# Patient Record
Sex: Female | Born: 1990 | Race: White | Hispanic: No | Marital: Married | Smoking: Never smoker
Health system: Southern US, Community
[De-identification: ages and names within clinical notes are randomized; demographics above are authoritative.]

## PROBLEM LIST (undated history)

## (undated) ENCOUNTER — Inpatient Hospital Stay (HOSPITAL_COMMUNITY): Payer: Self-pay

## (undated) DIAGNOSIS — F32A Depression, unspecified: Secondary | ICD-10-CM

## (undated) DIAGNOSIS — C801 Malignant (primary) neoplasm, unspecified: Secondary | ICD-10-CM

## (undated) DIAGNOSIS — R55 Syncope and collapse: Secondary | ICD-10-CM

## (undated) DIAGNOSIS — R197 Diarrhea, unspecified: Secondary | ICD-10-CM

## (undated) DIAGNOSIS — K219 Gastro-esophageal reflux disease without esophagitis: Secondary | ICD-10-CM

## (undated) DIAGNOSIS — R112 Nausea with vomiting, unspecified: Secondary | ICD-10-CM

## (undated) DIAGNOSIS — K5080 Crohn's disease of both small and large intestine without complications: Secondary | ICD-10-CM

## (undated) DIAGNOSIS — K50013 Crohn's disease of small intestine with fistula: Secondary | ICD-10-CM

## (undated) DIAGNOSIS — D649 Anemia, unspecified: Secondary | ICD-10-CM

## (undated) DIAGNOSIS — F329 Major depressive disorder, single episode, unspecified: Secondary | ICD-10-CM

## (undated) DIAGNOSIS — K2210 Ulcer of esophagus without bleeding: Secondary | ICD-10-CM

## (undated) HISTORY — DX: Syncope and collapse: R55

## (undated) HISTORY — DX: Ulcer of esophagus without bleeding: K22.10

## (undated) HISTORY — PX: TONSILLECTOMY AND ADENOIDECTOMY: SUR1326

## (undated) HISTORY — DX: Anemia, unspecified: D64.9

## (undated) HISTORY — DX: Major depressive disorder, single episode, unspecified: F32.9

## (undated) HISTORY — DX: Depression, unspecified: F32.A

## (undated) HISTORY — PX: UPPER GASTROINTESTINAL ENDOSCOPY: SHX188

## (undated) HISTORY — PX: COLONOSCOPY: SHX174

## (undated) HISTORY — PX: SKIN CANCER EXCISION: SHX779

## (undated) HISTORY — DX: Gastro-esophageal reflux disease without esophagitis: K21.9

## (undated) HISTORY — DX: Crohn's disease of both small and large intestine without complications: K50.80

---
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
## 2009-06-26 ENCOUNTER — Ambulatory Visit: Payer: Self-pay | Admitting: Internal Medicine

## 2009-07-17 ENCOUNTER — Ambulatory Visit: Payer: Self-pay | Admitting: Internal Medicine

## 2009-09-13 ENCOUNTER — Ambulatory Visit: Payer: Self-pay | Admitting: Internal Medicine

## 2010-01-15 ENCOUNTER — Ambulatory Visit: Payer: Self-pay | Admitting: Internal Medicine

## 2010-02-18 ENCOUNTER — Encounter: Admission: RE | Admit: 2010-02-18 | Discharge: 2010-02-18 | Payer: Self-pay | Admitting: Internal Medicine

## 2010-02-18 ENCOUNTER — Ambulatory Visit: Payer: Self-pay | Admitting: Internal Medicine

## 2010-02-19 ENCOUNTER — Ambulatory Visit: Payer: Self-pay | Admitting: Internal Medicine

## 2010-02-26 ENCOUNTER — Encounter: Admission: RE | Admit: 2010-02-26 | Discharge: 2010-02-26 | Payer: Self-pay | Admitting: Internal Medicine

## 2010-02-26 ENCOUNTER — Ambulatory Visit: Payer: Self-pay | Admitting: Internal Medicine

## 2010-08-16 ENCOUNTER — Emergency Department (HOSPITAL_COMMUNITY): Payer: No Typology Code available for payment source

## 2010-08-16 ENCOUNTER — Emergency Department (HOSPITAL_COMMUNITY)
Admission: EM | Admit: 2010-08-16 | Discharge: 2010-08-16 | Disposition: A | Discharge status: Home / Self Care | Payer: No Typology Code available for payment source | Attending: Emergency Medicine | Admitting: Emergency Medicine

## 2010-08-16 DIAGNOSIS — S9030XA Contusion of unspecified foot, initial encounter: Secondary | ICD-10-CM | POA: Insufficient documentation

## 2010-08-16 DIAGNOSIS — M25579 Pain in unspecified ankle and joints of unspecified foot: Secondary | ICD-10-CM | POA: Insufficient documentation

## 2010-08-16 DIAGNOSIS — M542 Cervicalgia: Secondary | ICD-10-CM | POA: Insufficient documentation

## 2010-08-16 DIAGNOSIS — M79609 Pain in unspecified limb: Secondary | ICD-10-CM | POA: Insufficient documentation

## 2010-08-16 DIAGNOSIS — R079 Chest pain, unspecified: Secondary | ICD-10-CM | POA: Insufficient documentation

## 2010-08-16 DIAGNOSIS — S139XXA Sprain of joints and ligaments of unspecified parts of neck, initial encounter: Secondary | ICD-10-CM | POA: Insufficient documentation

## 2011-04-15 ENCOUNTER — Encounter: Payer: Self-pay | Admitting: Internal Medicine

## 2011-04-15 ENCOUNTER — Ambulatory Visit (INDEPENDENT_AMBULATORY_CARE_PROVIDER_SITE_OTHER): Payer: PRIVATE HEALTH INSURANCE | Admitting: Internal Medicine

## 2011-04-15 DIAGNOSIS — M542 Cervicalgia: Secondary | ICD-10-CM

## 2011-04-15 DIAGNOSIS — M25511 Pain in right shoulder: Secondary | ICD-10-CM

## 2011-04-15 DIAGNOSIS — Z862 Personal history of diseases of the blood and blood-forming organs and certain disorders involving the immune mechanism: Secondary | ICD-10-CM

## 2011-04-15 DIAGNOSIS — M25519 Pain in unspecified shoulder: Secondary | ICD-10-CM

## 2011-04-15 NOTE — Progress Notes (Signed)
  Subjective:    Patient ID: Robin Miller, female    DOB: 1990-08-13, 20 y.o.   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 Says she has some vague numbness in her right upper arm and right elbow. Had onset of this pain at the end of exams. Of course was spending a lot of time studying during finals. Denies any heavy lifting or injury to right neck or shoulder. Has noticed some weakness in right arm strength. Never had problems like this before.    Review of Systems     Objective:   Physical Exam deep tendon reflexes in both arms are 2+ and symmetrical. Muscle strength is 5 over 5 in the left upper extremity 4/5 in the right upper extremity. Decreased grip right hand. Complaining of some numbness around right elbow but sensation is intact. Has palpable tenderness in the right trapezius muscle area extending into the right paracervical muscle area.        Assessment & Plan:  Suspect this is musculoskeletal pain related to muscle spasm with prolonged studying for final exams. Cannot rule out cervical radiculopathy. Plan: Sterapred DS 10 mg 12 day dosepak. Vicodin 5/500 (#40) 1 by mouth every 6 hours when necessary pain. Recommend ice to right neck area 20 minutes twice daily. Recheck on December 26

## 2011-04-15 NOTE — Patient Instructions (Signed)
Take prednisone for 12 days as directed. Take Vicodin as needed sparingly for pain. Ice her neck down for 20 minutes twice daily. No heavy lifting or heavy housework. Recheck in one week.

## 2011-04-23 ENCOUNTER — Encounter: Payer: Self-pay | Admitting: Internal Medicine

## 2011-04-23 ENCOUNTER — Ambulatory Visit (INDEPENDENT_AMBULATORY_CARE_PROVIDER_SITE_OTHER): Payer: PRIVATE HEALTH INSURANCE | Admitting: Internal Medicine

## 2011-04-23 VITALS — BP 116/78 | HR 76 | Temp 99.3°F | Wt 143.0 lb

## 2011-04-23 DIAGNOSIS — M25519 Pain in unspecified shoulder: Secondary | ICD-10-CM

## 2011-04-23 DIAGNOSIS — Z23 Encounter for immunization: Secondary | ICD-10-CM

## 2011-04-23 DIAGNOSIS — M25511 Pain in right shoulder: Secondary | ICD-10-CM | POA: Insufficient documentation

## 2011-04-23 NOTE — Progress Notes (Signed)
  Subjective:    Patient ID: Robin Miller, female    DOB: 1990-10-25, 20 y.o.   MRN: [id number]  HPI patient here to followup on right arm pain and numbness. She's been on a 12 day Sterapred DS 10 mg dosepak. Decreased range of motion of right arm up over her.. Says that she has a little bit better. Still has intermittent numbness right deltoid region. Says she's not really having neck pain. Has been taking Vicodin at bedtime to sleep. Patient describes pressure feeling sometimes in right upper arm as if the circulation is being "cut off ".    Review of Systems     Objective:   Physical Exam  I believe her muscle strength in her right grip has improved. She thinks it's improved also. I think she is a bit stronger in her right upper extremity in general. Deep tendon reflexes 2+ and symmetrical in upper extremities. Sensation in right upper arm is intact. No numbness at the present time.        Assessment & Plan:  Musculoskeletal pain versus radiculopathy  Symptoms seemed improved with steroids and pain medication.  Plan: Patient returns to [facility] January 14. We'll have her seen by hand surgeon this week for further evaluation before we consider ordering any imaging studies. Continue with prednisone.

## 2011-04-23 NOTE — Patient Instructions (Addendum)
Continue prednisone as directed and Vicodin at bedtime for pain. We will make appointment for you to see hand surgeon. Appointment with Dr. Mina Marble on Thurs 04/24/11 @ 2:10 pm. Notes and demographics faxed.

## 2011-09-17 ENCOUNTER — Emergency Department (HOSPITAL_BASED_OUTPATIENT_CLINIC_OR_DEPARTMENT_OTHER): Payer: PRIVATE HEALTH INSURANCE

## 2011-09-17 ENCOUNTER — Encounter (HOSPITAL_BASED_OUTPATIENT_CLINIC_OR_DEPARTMENT_OTHER): Payer: Self-pay | Admitting: *Deleted

## 2011-09-17 ENCOUNTER — Emergency Department (HOSPITAL_BASED_OUTPATIENT_CLINIC_OR_DEPARTMENT_OTHER)
Admission: EM | Admit: 2011-09-17 | Discharge: 2011-09-18 | Disposition: A | Discharge status: Home / Self Care | Payer: PRIVATE HEALTH INSURANCE | Attending: Emergency Medicine | Admitting: Emergency Medicine

## 2011-09-17 DIAGNOSIS — R509 Fever, unspecified: Secondary | ICD-10-CM | POA: Insufficient documentation

## 2011-09-17 DIAGNOSIS — R110 Nausea: Secondary | ICD-10-CM | POA: Insufficient documentation

## 2011-09-17 DIAGNOSIS — K529 Noninfective gastroenteritis and colitis, unspecified: Secondary | ICD-10-CM

## 2011-09-17 DIAGNOSIS — F329 Major depressive disorder, single episode, unspecified: Secondary | ICD-10-CM | POA: Insufficient documentation

## 2011-09-17 DIAGNOSIS — R630 Anorexia: Secondary | ICD-10-CM | POA: Insufficient documentation

## 2011-09-17 DIAGNOSIS — K5289 Other specified noninfective gastroenteritis and colitis: Secondary | ICD-10-CM | POA: Insufficient documentation

## 2011-09-17 DIAGNOSIS — F3289 Other specified depressive episodes: Secondary | ICD-10-CM | POA: Insufficient documentation

## 2011-09-17 DIAGNOSIS — R1031 Right lower quadrant pain: Secondary | ICD-10-CM | POA: Insufficient documentation

## 2011-09-17 LAB — COMPREHENSIVE METABOLIC PANEL
ALT: 9 U/L (ref 0–35)
AST: 12 U/L (ref 0–37)
Albumin: 3.7 g/dL (ref 3.5–5.2)
CO2: 23 mEq/L (ref 19–32)
Calcium: 9.5 mg/dL (ref 8.4–10.5)
Chloride: 103 mEq/L (ref 96–112)
GFR calc non Af Amer: >90 mL/min (ref >90)
Sodium: 138 mEq/L (ref 135–145)
Total Bilirubin: 0.2 mg/dL — ABNORMAL LOW (ref 0.3–1.2)

## 2011-09-17 LAB — WET PREP, GENITAL
Trich, Wet Prep: NONE SEEN
Yeast Wet Prep HPF POC: NONE SEEN

## 2011-09-17 MED ORDER — IOHEXOL 300 MG/ML  SOLN
100.0000 mL | Freq: Once | INTRAMUSCULAR | Status: AC | PRN
  Administered 2011-09-17: 100 mL via INTRAVENOUS

## 2011-09-17 MED ORDER — ONDANSETRON HCL 4 MG/2ML IJ SOLN
4.0000 mg | Freq: Once | INTRAMUSCULAR | Status: AC
  Administered 2011-09-17: 4 mg via INTRAVENOUS
  Filled 2011-09-17: qty 2

## 2011-09-17 MED ORDER — SODIUM CHLORIDE 0.9 % IV BOLUS (SEPSIS)
1000.0000 mL | Freq: Once | INTRAVENOUS | Status: AC
  Administered 2011-09-17: 1000 mL via INTRAVENOUS

## 2011-09-17 MED ORDER — MORPHINE SULFATE 4 MG/ML IJ SOLN
4.0000 mg | Freq: Once | INTRAMUSCULAR | Status: AC
  Administered 2011-09-17: 4 mg via INTRAVENOUS
  Filled 2011-09-17: qty 1

## 2011-09-17 MED ORDER — HYDROMORPHONE HCL PF 1 MG/ML IJ SOLN
1.0000 mg | Freq: Once | INTRAMUSCULAR | Status: AC
  Administered 2011-09-17: 1 mg via INTRAVENOUS
  Filled 2011-09-17: qty 1

## 2011-09-17 MED ORDER — IOHEXOL 300 MG/ML  SOLN: 20.0000 mL | INTRAMUSCULAR | Status: AC

## 2011-09-17 NOTE — ED Notes (Signed)
Sent here from urgent care for right lower  abd pain x 2 weeks. CBC and u/e and urine preg neg

## 2011-09-17 NOTE — ED Provider Notes (Signed)
History     CSN: [id number]  Arrival date & time 09/17/11  2105   First MD Initiated Contact with Patient 09/17/11 2146      Chief Complaint  Patient presents with  . Abdominal Pain    (Consider location/radiation/quality/duration/timing/severity/associated sxs/prior treatment) HPI Pt presenting with RLQ abdominal pain.  Pt states pain began approx 10 days ago, was initially periumbilical in nature, then migrated to RLQ.  Pt has had nausea, no vomiting.  Low grade fever x 2 days approx 99-100.  Pain has worsened today.  Decreased appetite.  Denies dysuria or vaginal bleeding.  Pt was seen at [facility]- WBC normal, urinalysis and urine preg reassuring.  Referred to ED for further evaluation.  Movement and palpation make pain worse, lying still makes it better, there are no other associated systemic symptoms.   Past Medical History  Diagnosis Date  . Depression   . Vasovagal syncope     History reviewed. No pertinent past surgical history.  Family History  Problem Relation Age of Onset  . Stroke Mother     History  Substance Use Topics  . Smoking status: Never Smoker   . Smokeless tobacco: Never Used  . Alcohol Use: No    OB History    Grav Para Term Preterm Abortions TAB SAB Ect Mult Living                  Review of Systems ROS reviewed and all otherwise negative except for mentioned in HPI  Allergies  Review of patient's allergies indicates no known allergies.  Home Medications   Current Outpatient Rx  Name Route Sig Dispense Refill  . CIPROFLOXACIN HCL 500 MG PO TABS Oral Take 1 tablet (500 mg total) by mouth 2 (two) times daily. One po bid x 7 days 14 tablet 0  . ETONOGESTREL-ETHINYL ESTRADIOL 0.12-0.015 MG/24HR VA RING Vaginal Place 1 each vaginally every 28 (twenty-eight) days. Insert vaginally and leave in place for 3 consecutive weeks, then remove for 1 week.     Marland Kitchen METRONIDAZOLE 500 MG PO TABS Oral Take 1 tablet (500 mg total) by mouth 3 (three) times  daily. 21 tablet 0  . OXYCODONE-ACETAMINOPHEN 5-325 MG PO TABS Oral Take 2 tablets by mouth every 4 (four) hours as needed for pain. 13 tablet 0    BP 120/67  Pulse 85  Temp(Src) 98.6 F (37 C) (Oral)  Resp 12  Ht 5\' 3"  (1.6 m)  Wt 140 lb (63.504 kg)  BMI 24.80 kg/m2  SpO2 100%  LMP 08/19/2011 Vitals reviewed Physical Exam Physical Examination: General appearance - alert, well appearing, and in no distress Mental status - alert, oriented to person, place, and time Eyes - pupils equal and reactive, extraocular eye movements intact Mouth - mucous membranes moist, pharynx normal without lesions Chest - clear to auscultation, no wheezes, rales or rhonchi, symmetric air entry Heart - normal rate, regular rhythm, normal S1, S2, no murmurs, rubs, clicks or gallops Abdomen - soft, ttp in RLQ, mild gaurding, no rebound tenderness, nondistended, no masses or organomegaly Pelvic - normal external genitalia, vulva, vagina, cervix, uterus and adnexa, no adnexal tenderness or mass, no CMT Extremities - peripheral pulses normal, no pedal edema, no clubbing or cyanosis Skin - normal coloration and turgor, no rashes, brisk cap refill  ED Course  Procedures (including critical care time)  Labs Reviewed  COMPREHENSIVE METABOLIC PANEL - Abnormal; Notable for the following:    Potassium 3.4 (*)    Total Bilirubin 0.2 (*)  All other components within normal limits  WET PREP, GENITAL - Abnormal; Notable for the following:    Clue Cells Wet Prep HPF POC FEW (*)    WBC, Wet Prep HPF POC FEW (*)    All other components within normal limits  LIPASE, BLOOD  GC/CHLAMYDIA PROBE AMP, GENITAL   No results found.   1. Ileitis       MDM  Pt presenting with RLQ pain- tender on exam.  WBC and urine preg/ua from [facility] performed today on chart and reviewed.  Pelvic exam with no significant CMT or adnexal tenderness.  Pt treated with IV hydration, pain meds and antiemetics.  Pt awaiting abdominal/pelvic  CT.          Ethelda Chick, MD 09/20/11 [phone]

## 2011-09-18 MED ORDER — CIPROFLOXACIN HCL 500 MG PO TABS: 500.0000 mg | ORAL_TABLET | Freq: Two times a day (BID) | ORAL | Status: AC

## 2011-09-18 MED ORDER — OXYCODONE-ACETAMINOPHEN 5-325 MG PO TABS: 2.0000 | ORAL_TABLET | ORAL | Status: AC | PRN

## 2011-09-18 MED ORDER — METRONIDAZOLE 500 MG PO TABS: 500.0000 mg | ORAL_TABLET | Freq: Three times a day (TID) | ORAL | Status: AC

## 2011-09-18 NOTE — ED Notes (Signed)
Pt educated on using barrier protection during sexual activity for at least 4 weeks while on antibiotics. Pt verbalized understanding

## 2011-09-18 NOTE — ED Provider Notes (Signed)
Spoke with patient, comfortable going home.  Will follow up with GI due to family h/o of crohns.  Return immediatel for worsening pain, vomiting, fever, diarrhea or any concerns.  Follow up in 2 days with your PMD.  Patient verbalizes understanding and agrees to follow up.  Currently pain free  Lameeka Schleifer K Lyrik Buresh-Rasch, MD 09/18/11 0025

## 2011-09-19 ENCOUNTER — Telehealth (HOSPITAL_BASED_OUTPATIENT_CLINIC_OR_DEPARTMENT_OTHER): Payer: Self-pay | Admitting: *Deleted

## 2011-09-19 LAB — GC/CHLAMYDIA PROBE AMP, GENITAL
Chlamydia, DNA Probe: NEGATIVE
GC Probe Amp, Genital: NEGATIVE

## 2011-09-19 NOTE — ED Notes (Signed)
Patient called requesting nausea medication.  Chart reviewed with Dr. Radford Pax.  Orders received for Zofran 8mg  ODT every 8 hours as needed for nausea.  Dispense 6 tabs.  Called prescription to [facility], [place], [place], [place] @ [phone].  Called patient with info and verbalized understanding.

## 2011-09-24 ENCOUNTER — Encounter: Payer: Self-pay | Admitting: Gastroenterology

## 2011-10-21 ENCOUNTER — Encounter: Payer: Self-pay | Admitting: Gastroenterology

## 2011-10-21 ENCOUNTER — Ambulatory Visit (INDEPENDENT_AMBULATORY_CARE_PROVIDER_SITE_OTHER): Payer: PRIVATE HEALTH INSURANCE | Admitting: Gastroenterology

## 2011-10-21 VITALS — BP 98/70 | HR 80 | Ht 63.0 in | Wt 141.2 lb

## 2011-10-21 DIAGNOSIS — R1031 Right lower quadrant pain: Secondary | ICD-10-CM

## 2011-10-21 DIAGNOSIS — R933 Abnormal findings on diagnostic imaging of other parts of digestive tract: Secondary | ICD-10-CM

## 2011-10-21 MED ORDER — MOVIPREP 100 G PO SOLR: 1.0000 | Freq: Once | ORAL | Status: DC

## 2011-10-21 MED ORDER — BUDESONIDE 3 MG PO CP24: ORAL_CAPSULE | ORAL | Status: DC

## 2011-10-21 MED ORDER — PREDNISONE 20 MG PO TABS: ORAL_TABLET | ORAL | Status: DC

## 2011-10-21 NOTE — Progress Notes (Signed)
History of Present Illness: This is a 21 year old female who has a 6 week history of intermittent periumbilical and right lower quadrant abdominal pain. At times her pain is severe and at other times her pain is mild and manageable. She relates alternating diarrhea and constipation. She occasionally notes red changes in her stools. She was seen at the emergency department where evaluation included a CT scan of the abdomen and pelvis revealing ileitis. She was treated with a one-week course of Cipro and Flagyl with no improvement in symptoms. Her father has Crohn's disease. She relates about a 10 pound weight loss. Denies change in stool caliber, melena, nausea, vomiting, dysphagia, reflux symptoms, chest pain.  Review of Systems: Pertinent positive and negative review of systems were noted in the above HPI section. All other review of systems were otherwise negative.  Current Medications, Allergies, Past Medical History, Past Surgical History, Family History and Social History were reviewed in [employer] record.  Physical Exam: General: Well developed , well nourished, no acute distress Head: Normocephalic and atraumatic Eyes:  sclerae anicteric, EOMI Ears: Normal auditory acuity Mouth: No deformity or lesions Neck: Supple, no masses or thyromegaly Lungs: Clear throughout to auscultation Heart: Regular rate and rhythm; no murmurs, rubs or bruits Abdomen: Soft, moderate right lower quadrant tenderness without rebound or guarding and and non distended. No masses, hepatosplenomegaly or hernias noted. Normal Bowel sounds Rectal: deferred to colonoscopy Musculoskeletal: Symmetrical with no gross deformities  Skin: No lesions on visible extremities Pulses:  Normal pulses noted Extremities: No clubbing, cyanosis, edema or deformities noted Neurological: Alert oriented x 4, grossly nonfocal Cervical Nodes:  No significant cervical adenopathy Inguinal Nodes: No significant  inguinal adenopathy Psychological:  Alert and cooperative. Normal mood and affect  Assessment and Recommendations:  1. Ileitis. Strongly suspect Crohn's ileitis. Begin prednisone and then budesonide. Schedule colonoscopy. The risks, benefits, and alternatives to colonoscopy with possible biopsy and possible polypectomy were discussed with the patient and they consent to proceed. We had a discussion for approximately 20 minutes about Crohn's disease-establishing the diagnosis, the treatment and expected course.

## 2011-10-21 NOTE — Patient Instructions (Addendum)
You have been scheduled for a colonoscopy with propofol. Please follow written instructions given to you at your visit today.  Please pick up your prep kit at the pharmacy within the next 1-3 days. We will contact you when a opening has become available to schedule you sooner for your Colonoscopy.  Start taking prednisone  60 mg (3 tablets) x 5 days, take 40 mg (2 tablets) x 5 days, take 20 mg (1 tablet) x 10 days. Day one of starting prednisone 20 mg tablets, please start your budesonide. These prescriptions have been sent to your pharmacy.  Stay on a Low-residue diet which has been provided to you and push plenty of fluids.  Low Fiber and Residue Restricted Diet A low fiber diet restricts foods that contain carbohydrates that are not digested in the small intestine. A diet containing about 10 g of fiber is considered low fiber. The diet needs to be individualized to suit patient tolerances and preferences and to avoid unnecessary restrictions. Generally, the foods emphasized in a low fiber diet have no skins or seeds. They may have been processed to remove bran, germ, or husks. Cooking may not necessarily eliminate the fiber. Cooking may, in fact, enable a greater quantity of fiber to be consumed in a lesser volume. Legumes and nuts are also restricted. The term low residue has also been used to describe low fiber diets, although the two are not the same. Residue refers to any substance that adds to bowel (colonic) contents, such as sloughed cells and intestinal bacteria, in addition to fiber. Residue-containing foods, prunes and prune juice, milk, and connective tissue from meats may also need to be eliminated. It is important to eliminate these foods during sudden (acute) attacks of inflammatory bowel disease, when there is a partial obstruction due to another reason, or when minimal fecal output is desired. When these problems are gone, a more normal diet may be used. PURPOSE  Prevent blockage of a  partially obstructed or narrowed gastrointestinal tract.   Reduce stool weight and volume.   Slow the movement of waste.  WHEN IS THIS DIET USED?  Acute phase of Crohn's disease, ulcerative colitis, regional enteritis, or diverticulitis.   Narrowing (stenosis) of intestinal or esophageal tubes (lumina).   Transitional diet following surgery, injury (trauma), or illness.  ADEQUACY This diet is nutritionally adequate based on individual food choices according to the Recommended Dietary Allowances of the [employer]. CHOOSING FOODS Check labels, especially on foods from the starch list. Often, dietary fiber content is listed with the Nutrition Facts panel.  Breads and Starches  Allowed: White, [place], and pita breads, plain rolls, buns, or sweet rolls, doughnuts, waffles, pancakes, bagels. Plain muffins, sweet breads, biscuits, matzoth. Flour. Soda, saltine, or graham crackers. Pretzels, rusks, melba toast, zwieback. Cooked cereals: cornmeal, farina, cream cereals. Dry cereals: refined corn, wheat, rice, and oat cereals (check label). Potatoes prepared any way without skins, refined macaroni, spaghetti, noodles, refined rice.   Avoid: Bread, rolls, or crackers made with whole-wheat, multigrains, rye, bran seeds, nuts, or coconut. Corn tortillas, table-shells. Corn chips, tortilla chips. Cereals containing whole-grains, multigrains, bran, coconut, nuts, or raisins. Cooked or dry oatmeal. Coarse wheat cereals, granola. Cereals advertised as "high fiber." Potato skins. Whole-grain pasta, wild or brown rice. Popcorn.  Vegetables  Allowed:  Strained tomato and vegetable juices. Fresh: tender lettuce, cucumber, cabbage, spinach, bean sprouts. Cooked, canned: asparagus, bean sprouts, cut green or wax beans, cauliflower, pumpkin, beets, mushrooms, olives, spinach, yellow squash, tomato, tomato sauce (  no seeds), zucchini (peeled), turnips. Canned sweet potatoes. Small amounts of celery,  onion, radish, and green pepper may be used. Keep servings limited to  cup.   Avoid: Fresh, cooked, or canned: artichokes, baked beans, beet greens, broccoli, Brussels sprouts, French-style green beans, corn, kale, legumes, peas, sweet potatoes. Cooked: green or red cabbage, spinach. Avoid large servings of any vegetables.  Fruit  Allowed:  All fruit juices except prune juice. Cooked or canned: apricots applesauce, cantaloupe, cherries, grapefruit, grapes, kiwi, mandarin oranges, peaches, pears, fruit cocktail, pineapple, plums, watermelon. Fresh: banana, grapes, cantaloupe, avocado, cherries, pineapple, grapefruit, kiwi, nectarines, peaches, oranges, blueberries, plums. Keep servings limited to  cup or 1 piece.   Avoid: Fresh: apple with or without skin, apricots, mango, pears, raspberries, strawberries. Prune juice, stewed or dried prunes. Dried fruits, raisins, dates. Avoid large servings of all fresh fruits.  Meat and Meat Substitutes  Allowed:  Ground or well-cooked tender beef, ham, veal, lamb, pork, or poultry. Eggs, plain cheese. Fish, oysters, shrimp, lobster, other seafood. Liver, organ meats.   Avoid: Tough, fibrous meats with gristle. Peanut butter, smooth or chunky. Cheese with seeds, nuts, or other foods not allowed. Nuts, seeds, legumes, dried peas, beans, lentils.  Milk  Allowed:  All milk products except those not allowed. Milk and milk product consumption should be minimal when low residue is desired.   Avoid: Yogurt that contains nuts or seeds.  Soups and Combination Foods  Allowed:  Bouillon, broth, or cream soups made from allowed foods. Any strained soup. Casseroles or mixed dishes made with allowed foods.   Avoid: Soups made from vegetables that are not allowed or that contain other foods not allowed.  Desserts and Sweets  Allowed:  Plain cakes and cookies, pie made with allowed fruit, pudding, custard, cream pie. Gelatin, fruit, ice, sherbet, frozen ice pops. Ice  cream, ice milk without nuts. Plain hard candy, honey, jelly, molasses, syrup, sugar, chocolate syrup, gumdrops, marshmallows.   Avoid: Desserts, cookies, or candies that contain nuts, peanut butter, or dried fruits. Jams, preserves with seeds, marmalade.  Fats and Oils  Allowed:  Margarine, butter, cream, mayonnaise, salad oils, plain salad dressings made from allowed foods. Plain gravy, crisp bacon without rind.   Avoid: Seeds, nuts, olives. Avocados.  Beverages  Allowed:  All, except those listed to avoid.   Avoid: Fruit juices with high pulp, prune juice.  Condiments  Allowed:  Ketchup, mustard, horseradish, vinegar, cream sauce, cheese sauce, cocoa powder. Spices in moderation: allspice, basil, bay leaves, celery powder or leaves, cinnamon, cumin powder, curry powder, ginger, mace, marjoram, onion or garlic powder, oregano, paprika, parsley flakes, ground pepper, rosemary, sage, savory, tarragon, thyme, turmeric.   Avoid: Coconut, pickles.  SAMPLE MEAL PLAN The following menu is provided as a sample. Your daily menu plans will vary. Be sure to include a minimum of the following each day in order to provide essential nutrients for the adult:  Starch/Bread/Cereal Group, 6 servings.   Fruit/Vegetable Group, 5 servings.   Meat/Meat Substitute Group, 2 servings.   Milk/Milk Substitute Group, 2 servings.  A serving is equal to  cup for fruits, vegetables, and cooked cereals or 1 piece for foods such as a piece of bread, 1 orange, or 1 apple. For dry cereals and crackers, use serving sizes listed on the label. Combination foods may count as full or partial servings from various food groups. Fats, desserts, and sweets may be added to the meal plan after the requirements for essential nutrients are met. SAMPLE  MENU Breakfast   cup orange juice.   1 boiled egg.   1 slice white toast.   Margarine.    cup cornflakes.   1 cup milk.   Beverage.  Lunch   cup chicken noodle  soup.   2 to 3 oz sliced roast beef.   2 slices seedless rye bread.   Mayonnaise.    cup tomato juice.   1 small banana.   Beverage.  Dinner  3 oz baked chicken.    cup scalloped potatoes.    cup cooked beets.   White dinner roll.   Margarine.    cup canned peaches.   Beverage.  Document Released: 10/04/2001 Document Revised: 04/03/2011 Document Reviewed: 03/17/2011 [facility] Patient Information 2012 [place], [place].   cc: Marlan Palau, MD

## 2011-10-22 ENCOUNTER — Encounter: Payer: Self-pay | Admitting: Gastroenterology

## 2011-10-27 ENCOUNTER — Telehealth: Payer: Self-pay | Admitting: Internal Medicine

## 2011-10-27 NOTE — Telephone Encounter (Signed)
Patient recently seen by [name] in clinic. Was seen for abdominal pain with abnormal GI imaging. Imaging consistent with ileitis. Presumed diagnosis is Crohn's disease, she is awaiting colonoscopy. She was started on prednisone with plans for transition to budesonide. She has been on prednisone for the last 7 days, 5 days at 60 mg daily followed by the last 2 days of 40 mg. She has not yet started budesonide Tonight, approximately 20 minutes ago she developed acute blurry vision. She reports that she was looking down, looked up and all the sudden had extreme blurry vision in both eyes. She does not have a headache, but feels pressure behind he eyes. She has had migraines before, but this does not feel typical for her previous migraines.  No fever she is aware of, but she does feel "really hot and sweaty". I have advised that she seek care in the emergency department tonight for further evaluation, given the acute onset of her symptoms. She voices understanding.  Please contact the patient tomorrow for followup and to insure she is improved.

## 2011-10-28 NOTE — Telephone Encounter (Signed)
Patient contacted this am.  She reports that she no longer has blurred vision, but she does have a HA and chest tightness.  She did not go to the ER last night as advised.  I have advised her that the recommendation stands and she should go to the ER for eval.  She verbalized understanding.

## 2011-11-02 ENCOUNTER — Telehealth: Payer: Self-pay | Admitting: Internal Medicine

## 2011-11-02 NOTE — Telephone Encounter (Signed)
Patient / Mom called yesterday evening. ? Medication reaction. Has been on rapid prednisone taper for possible Crohn's. Now on 20 mg. Just started budesonide. Vague complaints with malaise and jittery. Called earlier in week with other complaints prior to budesonide. Told to stop prednisone and continue on budesonide. Call back if needed. Keep plans for colonoscopy with Dr Russella Dar.

## 2011-11-04 ENCOUNTER — Other Ambulatory Visit (INDEPENDENT_AMBULATORY_CARE_PROVIDER_SITE_OTHER): Payer: PRIVATE HEALTH INSURANCE

## 2011-11-04 ENCOUNTER — Encounter: Payer: Self-pay | Admitting: Physician Assistant

## 2011-11-04 ENCOUNTER — Telehealth: Payer: Self-pay | Admitting: Gastroenterology

## 2011-11-04 ENCOUNTER — Ambulatory Visit (INDEPENDENT_AMBULATORY_CARE_PROVIDER_SITE_OTHER): Payer: PRIVATE HEALTH INSURANCE | Admitting: Physician Assistant

## 2011-11-04 VITALS — BP 124/60 | HR 64 | Ht 63.0 in | Wt 143.8 lb

## 2011-11-04 DIAGNOSIS — K5000 Crohn's disease of small intestine without complications: Secondary | ICD-10-CM

## 2011-11-04 DIAGNOSIS — K5080 Crohn's disease of both small and large intestine without complications: Secondary | ICD-10-CM | POA: Insufficient documentation

## 2011-11-04 DIAGNOSIS — R109 Unspecified abdominal pain: Secondary | ICD-10-CM

## 2011-11-04 DIAGNOSIS — T887XXA Unspecified adverse effect of drug or medicament, initial encounter: Secondary | ICD-10-CM

## 2011-11-04 HISTORY — DX: Crohn's disease of both small and large intestine without complications: K50.80

## 2011-11-04 LAB — CBC WITH DIFFERENTIAL/PLATELET
Basophils Absolute: 0 10*3/uL (ref 0.0–0.1)
Basophils Relative: 0.4 % (ref 0.0–3.0)
Eosinophils Absolute: 0 10*3/uL (ref 0.0–0.7)
MCHC: 33.2 g/dL (ref 30.0–36.0)
MCV: 86.7 fl (ref 78.0–100.0)
Monocytes Absolute: 0.7 10*3/uL (ref 0.1–1.0)
Neutrophils Relative %: 72.5 % (ref 43.0–77.0)
RBC: 4.85 Mil/uL (ref 3.87–5.11)
RDW: 14.1 % (ref 11.5–14.6)

## 2011-11-04 MED ORDER — DICYCLOMINE HCL 10 MG PO CAPS: ORAL_CAPSULE | ORAL | Status: DC

## 2011-11-04 MED ORDER — OXYCODONE-ACETAMINOPHEN 5-325 MG PO TABS: ORAL_TABLET | ORAL | Status: DC

## 2011-11-04 NOTE — Telephone Encounter (Signed)
Left message for patient to call back  

## 2011-11-04 NOTE — Progress Notes (Signed)
Subjective:    Patient ID: Robin Miller, female    DOB: October 30, 1990, 21 y.o.   MRN: [id number]  HPI Robin Miller is a 21 year old white female recently known to [name] who was seen on 10/21/2011 for the first time. She had presented with a six-week history of mid abdominal pain diarrhea and intermittent rectal bleeding. She had been seen at an urgent care and had undergone CT scan of the abdomen and pelvis which did show an active ileitis. She had been treated at that time with Cipro and Flagyl with no improvement in her symptoms. After she was seen by [name], she was started on prednisone in a rapid taper initially with 60 mg per day for about 1 week then decrease to 40 mg per day x5 days then 20 mg per day x5 days. She was to start budesonide when she started on the 20 mg dose of prednisone. She is scheduled for colonoscopy on 11/13/2011. She comes in today because she has been having a lot of side effects she believes from the prednisone initially had some mild nausea and insomnia intermittent blurred vision and some palpitations. She says while she was on the higher doses of prednisone she did have some decrease in her abdominal pain and cramping. She says she started having problems with shaking and tremors fairly soon after starting on the prednisone and this became progressively worse and more frequent. She actually stopped the prednisone on Sunday this past weekend. She has continued on the budesonide 9 mg per day. She says at this point she is having a couple of bowel movements per day of loose at times has not seen any blood in the past week or so and has been eating fairly well. She says last week on Thursday is when she had worse problems with shakiness which she says she was experiencing in all of her extremities. She said she had some uncontrollable shaking and episodes that would last for a couple of hours seemed to subside a bit and then resume. Last evening began after being off of the prednisone  for over 24 hours she says she was dozing on the couch beside her boyfriend and he noted that she was having twitching episodes of at which point her whole body was jumping. She says when she woke up she continued to have these twitching episodes for a couple of hours where her whole body would" jump", she did not have any loss of consciousness, incontinence etc. She denies any headache dizziness etc. She says the twitching movements have stopped at this point she feels a little bit jittery this morning but otherwise has felt better. She says she did wake up with a little bit of a headache this morning and some pressure behind her eyes but that has resolved. She says she has had some mild headaches over the past couple of weeks nothing severe. She has had no more dizziness over the past few days.    Review of Systems  Constitutional: Negative.   HENT: Negative.   Eyes: Negative.   Respiratory: Negative.   Cardiovascular: Negative.   Gastrointestinal: Positive for abdominal pain and diarrhea.  Genitourinary: Negative.   Musculoskeletal: Negative.   Neurological: Positive for dizziness and tremors.  Hematological: Negative.   Psychiatric/Behavioral: Positive for agitation.   Outpatient Prescriptions Prior to Visit  Medication Sig Dispense Refill  . budesonide (ENTOCORT EC) 3 MG 24 hr capsule Day one of prednisone 20 mg start this 3 capsules by mouth once daily  90 capsule  1  . etonogestrel-ethinyl estradiol (NUVARING) 0.12-0.015 MG/24HR vaginal ring Place 1 each vaginally every 28 (twenty-eight) days. Insert vaginally and leave in place for 3 consecutive weeks, then remove for 1 week.       Marland Kitchen MOVIPREP 100 G SOLR Take 1 kit (100 g total) by mouth once.  1 kit  0  . predniSONE (DELTASONE) 20 MG tablet Take 60 mg (3 tablets) x 5 days, take 40 mg (2 tablets) x 5 days, take 20 mg (1 tablet) x 10 days.  35 tablet  0   Allergies  Allergen Reactions  . Prednisone     Blurred vision, ringing in  ears, insomnia, shaky, body convulsions,nausea   Patient Active Problem List  Diagnosis  . History of anemia  . Right shoulder pain  . Ileitis, terminal  . Medication side effect   History   Social History  . Marital Status: Single    Spouse Name: N/A    Number of Children: 0  . Years of Education: N/A   Occupational History  . student    Social History Main Topics  . Smoking status: Never Smoker   . Smokeless tobacco: Never Used  . Alcohol Use: No  . Drug Use: No  . Sexually Active: Yes    Birth Control/ Protection: Inserts   Other Topics Concern  . Not on file   Social History Narrative  . No narrative on file       Objective:   Physical Exam well-developed young white female in no acute distress, pleasant; blood pressure 124/60 pulse 64 height 5 foot 3 weight 143. HEENT; nontraumatic normocephalic EOMI PERRLA sclera anicteric,neck; Supple no JVD, Cardiovascular; regular rate and rhythm with S1-S2 no murmur or gallop, Pulmonary; clear bilaterally, Abdomen; soft bowel sounds are active she's quite tender in the right mid quadrant right lower quadrant tender some guarding no rebound no palpable mass or hepatosplenomegaly, Rectal; not done, Extremities; no clubbing cyanosis or edema skin warm dry, Neuro; patient is alert and oriented x3 exam is grossly nonfocal she is no evidence of tremor or involuntary movements at present.        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 Patient schedule for colonoscopy on 11/13/2011 with Dr. Damita Lack to try to confirm diagnosis of Crohn's. #2 twitching, tremors, jitteriness and shaking episodes all while being treated with prednisone. Symptoms were at their worst when patient's transitioning from 40 mg to 20 mg of prednisone and then adding budesonide Patient with  an episode last evening of "total body and "twitching". On close discussion I do not think this represented a seizure though she likely has had an adverse side effects from steroids.  Plan; stop budesonide Keep appointment for scheduled colonoscopy with Dr. Damita Lack next week on 11/13/2011 Trial of Bentyl 10 mg 3 times daily as needed for cramping Percocet 5/325; 1 every 6 hours as needed for more intense pain CBC today for  baseline It appears she is steroid intolerant at least, and if it does appear that she has Crohn's at the time of colonoscopy will need to start alternative class of drugs i.e. Mesalamine, or biologic. Patient will polyps in 48 hours with a progress report if she's having any persistent symptoms at that point in time will obtain neuro referral

## 2011-11-04 NOTE — Progress Notes (Signed)
Reviewed,would keep her on Pred 10 mg till the colonoscopy, she needs the steroids. Add Ativan .5 mg hs.

## 2011-11-04 NOTE — Telephone Encounter (Signed)
Patient called with continued side effects from steroids.  She is c/o "full body shaking".  See previous phone notes from 10/27/11 and 11/02/11.  She will come in and see Mike Gip PA today at 11:00

## 2011-11-04 NOTE — Patient Instructions (Addendum)
Please go to the basement level to have your labs drawn.  We have given you a prescription for percocet to take to [facility], [address]  We sent a prescription for Bentyl for cramping electronically.

## 2011-11-05 NOTE — Progress Notes (Signed)
[name] read my note please and Robin Miller's suggestion - your pt . i was uncomfortable leaving her on steroids she is clearly intolerant

## 2011-11-13 ENCOUNTER — Encounter: Payer: Self-pay | Admitting: Gastroenterology

## 2011-11-13 ENCOUNTER — Ambulatory Visit (AMBULATORY_SURGERY_CENTER): Payer: PRIVATE HEALTH INSURANCE | Admitting: Gastroenterology

## 2011-11-13 VITALS — BP 120/79 | HR 100 | Temp 99.5°F | Resp 20 | Ht 63.0 in | Wt 143.0 lb

## 2011-11-13 DIAGNOSIS — R933 Abnormal findings on diagnostic imaging of other parts of digestive tract: Secondary | ICD-10-CM

## 2011-11-13 DIAGNOSIS — K5289 Other specified noninfective gastroenteritis and colitis: Secondary | ICD-10-CM

## 2011-11-13 DIAGNOSIS — R1031 Right lower quadrant pain: Secondary | ICD-10-CM

## 2011-11-13 DIAGNOSIS — D126 Benign neoplasm of colon, unspecified: Secondary | ICD-10-CM

## 2011-11-13 MED ORDER — SODIUM CHLORIDE 0.9 % IV SOLN: 500.0000 mL | INTRAVENOUS | Status: DC

## 2011-11-13 NOTE — Patient Instructions (Addendum)
Discharge instructions given with verbal understanding. Low residue diet given. Resume previous medications. YOU HAD AN ENDOSCOPIC PROCEDURE TODAY AT THE [facility]: Refer to the procedure report that was given to you for any specific questions about what was found during the examination.  If the procedure report does not answer your questions, please call your gastroenterologist to clarify.  If you requested that your care partner not be given the details of your procedure findings, then the procedure report has been included in a sealed envelope for you to review at your convenience later.  YOU SHOULD EXPECT: Some feelings of bloating in the abdomen. Passage of more gas than usual.  Walking can help get rid of the air that was put into your GI tract during the procedure and reduce the bloating. If you had a lower endoscopy (such as a colonoscopy or flexible sigmoidoscopy) you may notice spotting of blood in your stool or on the toilet paper. If you underwent a bowel prep for your procedure, then you may not have a normal bowel movement for a few days.  DIET: Your first meal following the procedure should be a light meal and then it is ok to progress to your normal diet.  A half-sandwich or bowl of soup is an example of a good first meal.  Heavy or fried foods are harder to digest and may make you feel nauseous or bloated.  Likewise meals heavy in dairy and vegetables can cause extra gas to form and this can also increase the bloating.  Drink plenty of fluids but you should avoid alcoholic beverages for 24 hours.  ACTIVITY: Your care partner should take you home directly after the procedure.  You should plan to take it easy, moving slowly for the rest of the day.  You can resume normal activity the day after the procedure however you should NOT DRIVE or use heavy machinery for 24 hours (because of the sedation medicines used during the test).    SYMPTOMS TO REPORT IMMEDIATELY: A  gastroenterologist can be reached at any hour.  During normal business hours, 8:30 AM to 5:00 PM Monday through Friday, call [phone].  After hours and on weekends, please call the GI answering service at [phone] who will take a message and have the physician on call contact you.   Following lower endoscopy (colonoscopy or flexible sigmoidoscopy):  Excessive amounts of blood in the stool  Significant tenderness or worsening of abdominal pains  Swelling of the abdomen that is new, acute  Fever of 100F or higher  FOLLOW UP: If any biopsies were taken you will be contacted by phone or by letter within the next 1-3 weeks.  Call your gastroenterologist if you have not heard about the biopsies in 3 weeks.  Our staff will call the home number listed on your records the next business day following your procedure to check on you and address any questions or concerns that you may have at that time regarding the information given to you following your procedure. This is a courtesy call and so if there is no answer at the home number and we have not heard from you through the emergency physician on call, we will assume that you have returned to your regular daily activities without incident.  SIGNATURES/CONFIDENTIALITY: You and/or your care partner have signed paperwork which will be entered into your electronic medical record.  These signatures attest to the fact that that the information above on your After Visit Summary has  been reviewed and is understood.  Full responsibility of the confidentiality of this discharge information lies with you and/or your care-partner.  

## 2011-11-13 NOTE — Op Note (Signed)
[facility] [address] [place], [place]  [address]  COLONOSCOPY PROCEDURE REPORT  PATIENT:  Robin Miller, Robin Miller  MR#:  [id number] BIRTHDATE:  November 01, 1990, 20 yrs. old  GENDER:  female ENDOSCOPIST:  Judie Petit T. Russella Dar, MD, [facility] Referred by:  Sharlet Salina, M.D. PROCEDURE DATE:  11/13/2011 PROCEDURE:  Colonoscopy with biopsy ASA CLASS:  Class I INDICATIONS:  1) abnormal CT of abdomen  2) abdominal pain-RLQ MEDICATIONS:   MAC sedation, administered by CRNA, propofol (Diprivan) 600 mg IV DESCRIPTION OF PROCEDURE:   After the risks benefits and alternatives of the procedure were thoroughly explained, informed consent was obtained.  Digital rectal exam was performed and revealed no abnormalities.   The LB CF-[id number] endoscope was introduced through the anus and advanced to the cecum, which was identified by both the appendix and ileocecal valve, without limitations.  The quality of the prep was adequate, using MoviPrep.  The instrument was then slowly withdrawn as the colon was fully examined. <<PROCEDUREIMAGES>> FINDINGS:  There were mucosal changes consistent with Crohn's disease at the ileocecal valve. They were ulcerated, friable and nodular with IC valve deformity. Unable to enter the TI. Two areas adjacent to the IC valve that may have been fistulae were noted, one possibly was a stenotic distal TI. Multiple biopsies were obtained and sent to pathology.  Otherwise normal colonoscopy without other polyps, masses, vascular ectasias, or inflammatory changes.  Retroflexed views in the rectum revealed no abnormalities.  The time to cecum =  3.67  minutes. The scope was then withdrawn (time =  14.75  min) from the patient and the procedure completed.  COMPLICATIONS:  None  ENDOSCOPIC IMPRESSION: 1) Colitis -Crohn's at the ileocecal valve  RECOMMENDATIONS: 1) Await pathology results 2) Out patient follow-up in 4 weeks 3) Resume budesonide 6 mg po qd 4) Low residue  diet  Hayden Kihara T. Russella Dar, MD, [facility]  n. eSIGNED:   Venita Lick. Bobbijo Holst at 11/13/2011 11:11 AM  Alvino Blood, [phone]

## 2011-11-14 ENCOUNTER — Telehealth: Payer: Self-pay | Admitting: *Deleted

## 2011-11-14 NOTE — Telephone Encounter (Signed)
  Follow up Call-  Call back number 11/13/2011  Post procedure Call Back phone  # [phone]  Permission to leave phone message Yes     Patient questions:  Do you have a fever, pain , or abdominal swelling? no Pain Score  0 *  Have you tolerated food without any problems? yes  Have you been able to return to your normal activities? yes  Do you have any questions about your discharge instructions: Diet   no Medications  no Follow up visit  no  Do you have questions or concerns about your Care? no  Actions: * If pain score is 4 or above: No action needed, pain <4.

## 2011-11-19 ENCOUNTER — Encounter: Payer: Self-pay | Admitting: Gastroenterology

## 2011-11-28 ENCOUNTER — Encounter: Payer: PRIVATE HEALTH INSURANCE | Admitting: Gastroenterology

## 2011-12-01 ENCOUNTER — Telehealth: Payer: Self-pay | Admitting: Gastroenterology

## 2011-12-01 NOTE — Telephone Encounter (Signed)
Pt states she was placed on Prednisone and she is not any better. States she is still having a lot of abdominal pain, diarrhea, and nausea. Pt wanted to know if there is anything else she could take. Please advise.

## 2011-12-01 NOTE — Telephone Encounter (Signed)
Spoke with pt and she is aware. States she has enough medication and does not need a new script sent. Asked what she could take for abdominal pain. Instructed pt to take the Bentyl that she has for abdominal pain and cramping. Pt to call back Monday and let us know how she is doing.

## 2011-12-01 NOTE — Telephone Encounter (Signed)
Increase budesonide to 9 mg po daily Low residue diet REV if not already scheduled, call if no significant improvement by next Monday

## 2011-12-08 ENCOUNTER — Telehealth: Payer: Self-pay | Admitting: Gastroenterology

## 2011-12-08 MED ORDER — DIPHENOXYLATE-ATROPINE 2.5-0.025 MG PO TABS: 1.0000 | ORAL_TABLET | Freq: Three times a day (TID) | ORAL | Status: DC | PRN

## 2011-12-08 NOTE — Telephone Encounter (Signed)
Patient advised.  She is asked to keep her appt for 12/22/11  With [name]

## 2011-12-08 NOTE — Telephone Encounter (Signed)
Patient reports continued diarrhea, rectal bleeding and occasional vomiting.  She is currently on entocort 9 mg a day. (see phone note from 12/01/11).  She reports no improvement in her symptoms.  She is intolerant to prednisone.  Amy Esterwood PA please review and advise

## 2011-12-08 NOTE — Telephone Encounter (Signed)
Continue Entocort 9 mg/day. Add lomotil one every morning  Before breakfast as needed up to 3 per day. Make sure she has a follow up with stark soon. If she is no better in a week then we may need to go back to low dose prednisone-like 20 mg per day, she was started on much higher doses last time

## 2011-12-21 ENCOUNTER — Telehealth: Payer: Self-pay | Admitting: Gastroenterology

## 2011-12-21 NOTE — Telephone Encounter (Signed)
On call note. Pt calls complaining of worsening RLQ pain and diarrhea for several days. Symptoms worse tonight. No F/C, N/V or GI bleeding. She is taking her prescribed medications. Advised to have someone take her to the ED for further evaluation. Suspected Crohn's flare.

## 2011-12-22 ENCOUNTER — Ambulatory Visit (INDEPENDENT_AMBULATORY_CARE_PROVIDER_SITE_OTHER): Payer: BC Managed Care – PPO | Admitting: Gastroenterology

## 2011-12-22 ENCOUNTER — Encounter: Payer: Self-pay | Admitting: Gastroenterology

## 2011-12-22 VITALS — BP 120/64 | HR 88 | Ht 63.0 in | Wt 147.6 lb

## 2011-12-22 DIAGNOSIS — R109 Unspecified abdominal pain: Secondary | ICD-10-CM

## 2011-12-22 DIAGNOSIS — K5080 Crohn's disease of both small and large intestine without complications: Secondary | ICD-10-CM

## 2011-12-22 MED ORDER — PREDNISONE 10 MG PO TABS: ORAL_TABLET | ORAL | Status: DC

## 2011-12-22 NOTE — Patient Instructions (Addendum)
We have sent the following medications to your pharmacy for you to pick up at your convenience: Prednisone 10 mg to take 2 tablets (20 mg) by mouth once daily x 5 days and if tolerated please increase to 3 tablets ( 30 mg) by mouth once daily until scheduled office visit in one month. Please call if you do not tolerate the prednisone and if for any reason have to stop the medication. cc: Marlan Palau, MD

## 2011-12-22 NOTE — Progress Notes (Signed)
History of Present Illness: This is a 21 year old female with newly diagnosed Crohn's ileocolitis. There is significant inflammation and possibly scarring and stricturing at the ileocecal valve. She has persistent problems with lower abdominal pain intermittent diarrhea intermittent urge to have a bowel movement without a bowel movement. She had a flare of pain that lasted several hours this weekend that has now subsided. She feels that in general she has not made any improvement and possibly her symptoms are slightly worse. She has side effects on 40-60 mg of prednisone daily and is reluctant to take it again. She denies nausea, vomiting, fevers, chills, weight loss.  Current Medications, Allergies, Past Medical History, Past Surgical History, Family History and Social History were reviewed in [employer] record.  Physical Exam: General: Well developed , well nourished, no acute distress Head: Normocephalic and atraumatic Eyes:  sclerae anicteric, EOMI Ears: Normal auditory acuity Mouth: No deformity or lesions Lungs: Clear throughout to auscultation Heart: Regular rate and rhythm; no murmurs, rubs or bruits Abdomen: Soft, moderate lower bowel tenderness without rebound or guarding and non distended. No masses, hepatosplenomegaly or hernias noted. Normal Bowel sounds Musculoskeletal: Symmetrical with no gross deformities  Pulses:  Normal pulses noted Extremities: No clubbing, cyanosis, edema or deformities noted Neurological: Alert oriented x 4, grossly nonfocal Psychological:  Alert and cooperative. Normal mood and affect  Assessment and Recommendations:  1. Newly diagnosed Crohn's ileocolitis with inflammation and possible stricturing at ileocecal valve. Persistent symptoms that have not improved and may have worsened. We had a long discussion about the options of lower dose prednisone, immunosuppressive therapy with 6-MP, biologics and surgery. After an extensive  discussion she is agreeable to try prednisone at 20 mg daily for 5 days and we will increase to 30 mg daily if she tolerates 20 mg. Continue budesonide. Return office visit in 4 weeks.

## 2011-12-30 ENCOUNTER — Telehealth: Payer: Self-pay | Admitting: Gastroenterology

## 2011-12-30 DIAGNOSIS — K5090 Crohn's disease, unspecified, without complications: Secondary | ICD-10-CM

## 2011-12-30 NOTE — Telephone Encounter (Signed)
Patient reports blurred vision , dizziness, HA, and "accelerated heart rate".  She stopped the prednisone yesterday and was only on 20 mg.  She notes that she still has abdominal pain, rectal pain, diarrhea and vomiting.  [name] please advise next step.

## 2011-12-30 NOTE — Telephone Encounter (Signed)
Patient is scheduled for tomorrow for labs and PPD skin test.  She has follow up with you on 01/26/12.  Do I need to move this up?

## 2011-12-30 NOTE — Telephone Encounter (Signed)
Let's keep that appointment for now She should stay on budesonide 9 mg daily Also send CMET, CBC, ESR, CRP, BHCG

## 2011-12-30 NOTE — Telephone Encounter (Signed)
Needs PPD and HBsAg in preparation for possible biologic therapy

## 2011-12-31 ENCOUNTER — Other Ambulatory Visit (INDEPENDENT_AMBULATORY_CARE_PROVIDER_SITE_OTHER): Payer: BC Managed Care – PPO | Admitting: Gastroenterology

## 2011-12-31 ENCOUNTER — Encounter: Payer: BC Managed Care – PPO | Admitting: Gastroenterology

## 2011-12-31 ENCOUNTER — Other Ambulatory Visit (INDEPENDENT_AMBULATORY_CARE_PROVIDER_SITE_OTHER): Payer: BC Managed Care – PPO

## 2011-12-31 DIAGNOSIS — K5090 Crohn's disease, unspecified, without complications: Secondary | ICD-10-CM

## 2011-12-31 LAB — COMPREHENSIVE METABOLIC PANEL
ALT: 11 U/L (ref 0–35)
AST: 17 U/L (ref 0–37)
Alkaline Phosphatase: 45 U/L (ref 39–117)
Potassium: 4.2 mEq/L (ref 3.5–5.1)
Sodium: 137 mEq/L (ref 135–145)
Total Bilirubin: 0.5 mg/dL (ref 0.3–1.2)
Total Protein: 7.6 g/dL (ref 6.0–8.3)

## 2011-12-31 LAB — CBC WITH DIFFERENTIAL/PLATELET
Basophils Absolute: 0 10*3/uL (ref 0.0–0.1)
Eosinophils Absolute: 0.1 10*3/uL (ref 0.0–0.7)
HCT: 39.7 % (ref 36.0–46.0)
Lymphs Abs: 1.3 10*3/uL (ref 0.7–4.0)
MCV: 87.4 fl (ref 78.0–100.0)
Monocytes Absolute: 0.5 10*3/uL (ref 0.1–1.0)
Neutrophils Relative %: 76 % (ref 43.0–77.0)
Platelets: 285 10*3/uL (ref 150.0–400.0)
RDW: 14.2 % (ref 11.5–14.6)

## 2011-12-31 LAB — SEDIMENTATION RATE: Sed Rate: 16 mm/hr (ref 0–22)

## 2011-12-31 NOTE — Telephone Encounter (Signed)
Patient aware new labs entered

## 2012-01-02 LAB — TB SKIN TEST
Induration: 0 mm
TB Skin Test: NEGATIVE

## 2012-01-26 ENCOUNTER — Ambulatory Visit (INDEPENDENT_AMBULATORY_CARE_PROVIDER_SITE_OTHER): Payer: BC Managed Care – PPO | Admitting: Gastroenterology

## 2012-01-26 ENCOUNTER — Encounter: Payer: Self-pay | Admitting: Gastroenterology

## 2012-01-26 VITALS — BP 100/60 | HR 60 | Ht 63.5 in | Wt 138.8 lb

## 2012-01-26 DIAGNOSIS — K5080 Crohn's disease of both small and large intestine without complications: Secondary | ICD-10-CM

## 2012-01-26 NOTE — Progress Notes (Signed)
History of Present Illness: This is a 21 year old female with Crohn's ileocolitis confined to the distal terminal ileum and ileocecal valve. Unfortunately she's been unable to tolerate corticosteroids and budesonide has not helped her symptoms. She states her symptoms have slightly worsened over the past few weeks. She has persistent abdominal pain, which increases in severity following meals. She has lost about 13 pounds since her symptoms began in May.  Current Medications, Allergies, Past Medical History, Past Surgical History, Family History and Social History were reviewed in [employer] record.  Physical Exam: General: Well developed , well nourished, no acute distress Head: Normocephalic and atraumatic Eyes:  sclerae anicteric, EOMI Ears: Normal auditory acuity Mouth: No deformity or lesions Lungs: Clear throughout to auscultation Heart: Regular rate and rhythm; no murmurs, rubs or bruits Abdomen: Soft, moderate tenderness in the right lower quadrant and mild tenderness across rest of the abdomen and non distended. No masses, hepatosplenomegaly or hernias noted. Normal Bowel sounds Musculoskeletal: Symmetrical with no gross deformities  Pulses:  Normal pulses noted Extremities: No clubbing, cyanosis, edema or deformities noted Neurological: Alert oriented x 4, grossly nonfocal Psychological:  Alert and cooperative. Normal mood and affect  Assessment and Recommendations:  1. Crohn's ileocolitis with disease that appears to be confined to her terminal ileum and ileocecal valve. We have tried on 2 occasions to use corticosteroids and she cannot tolerate corticosteroids due to side effects. Her worsening abdominal pain following meals suggests a partial obstructive symtpoms. Given her symptoms and the findings at colonoscopy there probably it is a component of fixed scarring that may not respond to medications. We again discussed the options of 6-MP, biologic therapy  and surgery. 6-MP would probably take too much time to work. She is very reluctant to proceed with biologic therapy. She would like to proceed with surgery. Advised her to begin 6-mercaptopurine several weeks after surgery. I think surgery is very reasonable given that she likely has fixed scarring, she has a very limited segment of disease and it is her personal preference.

## 2012-01-26 NOTE — Patient Instructions (Addendum)
You have been scheduled for an appointment with ______________ at [facility]. Your appointment is on ___________ at ________. Please arrive at ________ for registration. Make certain to bring a list of current medications, including any over the counter medications or vitamins. Also bring your co-pay if you have one as well as your insurance cards. [facility] is located at [address]. Should you need to reschedule your appointment, please contact them at [phone].  We will call you with an appointment date and time once we hear back from [facility]  Please make an appointment to follow up with [name] in three to four weeks after you have been medically cleared  from [facility]   CC: Marlan Palau, MD

## 2012-02-05 ENCOUNTER — Ambulatory Visit (INDEPENDENT_AMBULATORY_CARE_PROVIDER_SITE_OTHER): Payer: BC Managed Care – PPO | Admitting: Surgery

## 2012-02-05 ENCOUNTER — Encounter (INDEPENDENT_AMBULATORY_CARE_PROVIDER_SITE_OTHER): Payer: Self-pay | Admitting: Surgery

## 2012-02-05 VITALS — BP 118/70 | HR 97 | Temp 99.8°F | Resp 18 | Ht 63.0 in | Wt 138.4 lb

## 2012-02-05 DIAGNOSIS — K5000 Crohn's disease of small intestine without complications: Secondary | ICD-10-CM

## 2012-02-05 NOTE — Progress Notes (Signed)
Subjective:     Patient ID: Robin Miller, female   DOB: 17-Nov-1990, 21 y.o.   MRN: [id number]  HPI  Robin Miller  03-22-1991 [id number]  Patient Care Team: [name], MD as PCP - General (Internal Medicine)  This patient is a 21 y.o.female who presents today for surgical evaluation at the request of [name].   Reason for evaluation: Crohn's terminal ileitis.  Persistent symptoms despite immunosuppression.  Consideration of segmental resection  Pleasant college student.  Noticed in May worsening abdominal discomfort and intermittent loose bowels.  Came emergency room.  Concern of appendicitis.  Underwent CAT scan.  Found to have terminal ileum suspicious for Crohn's.  Followed up with [facility].  [name] performed endoscopy in July.  Did biopsies.  Confirmed chronic active ileitis strongly suggestive of Crohn's.  No other disease elsewhere.  The patient has tried different rounds of steroids.  Currently on Entocort 3 mg 3 times a day.  She has been on that for two months.  No improvement in symptoms.  Having 5-6 loose bowel movements a day.  Intermittent nausea vomiting and severe pain.   Vomiting about every other day now  She denies any joint pain.  No fevers.  No history of ulcers.  No skin rashes.  Has a father with Crohn's disease.  Sounds like his is been mild.  No other family history of bowel problems.  No personal nor family history of GI/colon cancer, irritable bowel syndrome, allergy such as Celiac Sprue, dietary/dairy problems, colitis, ulcers nor gastritis.  No recent sick contacts/gastroenteritis.  No travel outside the country.  No changes in diet.  Because of her poor tolerance to certain anti-inflammatory regimens, no improvement And now actually worsening vomiting after several months of her current regimen, she was sent to me to see if segmental resection would be of benefit.  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 - CMV STAIN IS NEGATIVE. - NO DYSPLASIA OR MALIGNANCY IDENTIFIED. - SEE COMMENT. Microscopic Comment Based on the histologic findings, Crohn's disease is favored. Please correlate with endoscopic impression. Some of the tissue fragments present consist entirely of granulation tissue. If a polyp was seen clinically, the findings would support a granulation tissue inflammatory-type polyp. There is no dysplasia or malignancy identified. Dr. Frederica Kuster has seen this case in consultation with agreement. (RAH:gt, 11/19/11) Robin Abts MD Pathologist, Electronic Signature (Case signed 07/24/201  Patient Active Problem List  Diagnosis  . History of anemia  . Right shoulder pain  . Crohn's ileocolitis  . Medication side effect    Past Medical History  Diagnosis Date  . Depression   . Vasovagal syncope   . Crohn's ileitis   . Anemia     Past Surgical History  Procedure Date  . Tonsillectomy and adenoidectomy     History   Social History  . Marital Status: Single    Spouse Name: N/A    Number of Children: 0  . Years of Education: N/A   Occupational History  . student    Social History Main Topics  . Smoking status: Never Smoker   . Smokeless tobacco: Never Used  . Alcohol Use: No  . Drug Use: No  . Sexually Active: Yes    Birth Control/ Protection: Inserts   Other Topics Concern  . Not on file   Social History Narrative  . No narrative on file  Family History  Problem Relation Age of Onset  . Stroke Mother   . Breast cancer Mother   . Breast cancer Paternal Aunt   . Crohn's disease Father   . Colitis Maternal Aunt   . Colon cancer Paternal Grandmother     ? cancer, part of colon removed    Current Outpatient Prescriptions  Medication Sig Dispense Refill  . budesonide (ENTOCORT EC) 3 MG 24 hr capsule 3 capsules by mouth once daily      . dicyclomine  (BENTYL) 10 MG capsule Take 1 tab 3 times daily as needed for cramping.  90 capsule  0  . diphenoxylate-atropine (LOMOTIL) 2.5-0.025 MG per tablet Take 1 tablet by mouth 3 (three) times daily as needed for diarrhea or loose stools.  90 tablet  1  . etonogestrel-ethinyl estradiol (NUVARING) 0.12-0.015 MG/24HR vaginal ring Place 1 each vaginally every 28 (twenty-eight) days. Insert vaginally and leave in place for 3 consecutive weeks, then remove for 1 week.       Marland Kitchen oxyCODONE-acetaminophen (PERCOCET) 5-325 MG per tablet Take 1 tab every 6 hours as needed for pain.  30 tablet  0     Allergies  Allergen Reactions  . Prednisone     Blurred vision, ringing in ears, insomnia, shaky, body convulsions,nausea    BP 118/70  Pulse 97  Temp 99.8 F (37.7 C) (Oral)  Resp 18  Ht 5\' 3"  (1.6 m)  Wt 138 lb 6.4 oz (62.778 kg)  BMI 24.52 kg/m2  LMP 01/07/2012  No results found.   Review of Systems  Constitutional: Negative for fever, chills, diaphoresis, appetite change and fatigue.  HENT: Negative for ear pain, congestion, sore throat, mouth sores, trouble swallowing, neck pain and ear discharge.   Eyes: Negative for photophobia, discharge and visual disturbance.  Respiratory: Negative for cough, choking, chest tightness and shortness of breath.   Cardiovascular: Negative for chest pain and palpitations.  Gastrointestinal: Positive for nausea, vomiting, abdominal pain, diarrhea and abdominal distention. Negative for constipation, anal bleeding and rectal pain.  Genitourinary: Negative for dysuria, frequency and difficulty urinating.  Musculoskeletal: Negative for myalgias, arthralgias and gait problem.  Skin: Negative for color change, pallor, rash and wound.  Neurological: Negative for dizziness, speech difficulty, weakness and numbness.  Hematological: Negative for adenopathy.  Psychiatric/Behavioral: Negative for confusion and agitation. The patient is not nervous/anxious.        Objective:     Physical Exam  Constitutional: She is oriented to person, place, and time. She appears well-developed and well-nourished. No distress.  HENT:  Head: Normocephalic.  Mouth/Throat: Oropharynx is clear and moist. No oropharyngeal exudate.  Eyes: Conjunctivae normal and EOM are normal. Pupils are equal, round, and reactive to light. No scleral icterus.  Neck: Normal range of motion. Neck supple. No tracheal deviation present.  Cardiovascular: Normal rate, regular rhythm and intact distal pulses.   Pulmonary/Chest: Effort normal and breath sounds normal. No respiratory distress. She exhibits no tenderness.  Abdominal: Soft. She exhibits no distension and no mass. There is no rebound and no guarding. Hernia confirmed negative in the right inguinal area and confirmed negative in the left inguinal area.       Mild RLQ discomfort  Genitourinary: No vaginal discharge found.  Musculoskeletal: Normal range of motion. She exhibits no tenderness.  Lymphadenopathy:    She has no cervical adenopathy.       Right: No inguinal adenopathy present.       Left: No inguinal adenopathy present.  Neurological: She  is alert and oriented to person, place, and time. No cranial nerve deficit. She exhibits normal muscle tone. Coordination normal.  Skin: Skin is warm and dry. No rash noted. She is not diaphoretic. No erythema.  Psychiatric: She has a normal mood and affect. Her behavior is normal. Judgment and thought content normal.       Assessment:     Crohn's disease with terminal ileal thickening.  Poor tolerance to certain anti-inflammatory regimens.  Worsening vomiting and persistent other symptoms on steroids for several months.    Plan:     I long discussion with the patient.  Also discussed with her new colorectal partner, Dr. Romie Levee.  My inclination is to resect the problem area since that is the only nidus of inflammation of her Crohn's.  She is failed medical therapy.  If she insisted on  maximizing medical options, consider a trial of Remicade to see if that would finally work.  However, Dr. Maisie Fus and I suspect that need for resection is imminent.  It seems the other route of resecting disease and then proceeding with an aggressive anti-inflammatory regimen to keep disease suppressed is a more reasonable approach.  I worry that the patient could continue to lose weight and get into a situation of severe malnourishment.  Risks of leak and ileostomy would be much higher.  The patient is more inclined to proceed with surgery to avoid getting worse.I did stress to her the importance of having regular followup with a gastroenterologist to keep the Crohn's disease in check.  If she disappears, need for repeated resections is much more likely.  Could progress to a point of short gut.  If she can be followed by gastroenterology, she is the best shot at keeping the Crohn's disease suppressed and maintaining adequate bowel length and avoiding obstructive and other debilitating problems.  She has never had bowel surgery before.  Good candidate for a laparoscopically assisted approach.  We will try and hide her extraction incision within her umbilicus  The anatomy & physiology of the digestive tract was discussed.  The pathophysiology was discussed.  Natural history risks without surgery was discussed.   I feel the risks of no intervention will lead to serious problems that outweigh the operative risks; therefore, I recommended a partial bowel resection to remove the pathology.  Laparoscopic & open techniques were discussed.   Risks such as bleeding, infection, abscess, leak, reoperation, possible ostomy, hernia, heart attack, death, and other risks were discussed.  I noted a good likelihood this will help address the problem.   Goals of post-operative recovery were discussed as well.  We will work to minimize complications.  An educational handout on the pathology was given as well.  Questions were  answered.  The patient expresses understanding & wishes to proceed with surgery.

## 2012-02-05 NOTE — Patient Instructions (Addendum)
See the Handout(s) we gave you.  Consider surgery To resect that short segment of thickened/stenosed intestine (terminal ileum).  Please call our office at [phone] if you wish to schedule surgery or if you have further questions / concerns.   Crohn's Disease Crohn's disease is a long-term (chronic) soreness and redness (inflammation) of the intestines (bowel). It can affect any portion of the digestive tract, from the mouth to the anus. It can also cause problems outside the digestive tract. Crohn's disease is closely related to a disease called ulcerative colitis (together, these two diseases are called inflammatory bowel disease).  CAUSES  The cause of Crohn's disease is not known. One Nelva Bush is that, in an easily affected (susceptible) person, the immune system is triggered to attack the body's own digestive tissue. Crohn's disease runs in families. It seems to be more common in certain geographic areas and amongst certain races. There are no clear-cut dietary causes.  SYMPTOMS  Crohn's disease can cause many different symptoms since it can affect many different parts of the body. Symptoms include:  Fatigue.  Weight loss.  Chronic diarrhea, sometime bloody.  Abdominal pain and cramps.  Fever.  Ulcers or canker sores in the mouth or rectum.  Anemia (low red blood cells).  Arthritis, skin problems, and eye problems may occur. Complications of Crohn's disease can include:  Series of holes (perforation) of the bowel.  Portions of the intestines sticking to each other (adhesions).  Obstruction of the bowel.  Fistula formation, typically in the rectal area but also in other areas. A fistula is an opening between the bowels and the outside, or between the bowels and another organ.  A painful crack in the mucous membrane of the anus (rectal fissure). DIAGNOSIS  Your caregiver may suspect Crohn's disease based on your symptoms and an exam. Blood tests may confirm that there is  a problem. You may be asked to submit a stool specimen for examination. X-rays and CT scans may be necessary. Ultimately, the diagnosis is usually made after a procedure that uses a flexible tube that is inserted via your mouth or your anus. This is done under sedation and is called either an upper endoscopy or colonoscopy. With these tests, the specialist can take tiny tissue samples and remove them from the inside of the bowel (biopsy). Examination of this biopsy tissue under a microscope can reveal Crohn's disease as the cause of your symptoms. Due to the many different forms that Crohn's disease can take, symptoms may be present for several years before a diagnosis is made. HOME CARE INSTRUCTIONS   There is no cure for Crohn's disease. The best treatment is frequent checkups with your caregiver.  Symptoms such as diarrhea can be controlled with medications. Avoid foods that have a laxative effect such as fresh fruit, vegetables and dairy products. During flare ups, you can rest your bowel by refraining from solid foods. Drink clear liquids frequently during the day (electrolyte or re-hydrating fluids are best. Your caregiver can help you with suggestions). Drink often to prevent loss of body fluids (dehydration). When diarrhea has cleared, eat small meals and more frequently. Avoid food additives and stimulants such as caffeine (coffee, tea, or chocolate). Enzyme supplements may help if you develop intolerance to a sugar in dairy products (lactose). Ask your caregiver or dietitian about specific dietary instructions.  Try to maintain a positive attitude. Learn relaxation techniques such as self hypnosis, mental imaging, and muscle relaxation.  If possible, avoid stresses which can aggravate  your condition.  Exercise regularly.  Follow your diet.  Always get plenty of rest. SEEK MEDICAL CARE IF:   Your symptoms fail to improve after a week or two of new treatment.  You experience continued  weight loss.  You have ongoing crampy digestion or loose bowels.  You develop a new skin rash, skin sores, or eye problems. SEEK IMMEDIATE MEDICAL CARE IF:   You have worsening of your symptoms or develop new symptoms.  You have a fever.  You develop bloody diarrhea.  You develop severe abdominal pain. MAKE SURE YOU:   Understand these instructions.  Will watch your condition.  Will get help right away if you are not doing well or get worse. Document Released: 01/22/2005 Document Revised: 07/07/2011 Document Reviewed: 12/21/2006 [facility] Patient Information 2013 [place], [place].

## 2012-02-27 ENCOUNTER — Other Ambulatory Visit: Payer: Self-pay

## 2012-03-03 ENCOUNTER — Encounter (HOSPITAL_COMMUNITY): Payer: Self-pay | Admitting: Pharmacy Technician

## 2012-03-09 ENCOUNTER — Encounter (HOSPITAL_COMMUNITY): Payer: Self-pay

## 2012-03-09 ENCOUNTER — Encounter (HOSPITAL_COMMUNITY)
Admission: RE | Admit: 2012-03-09 | Discharge: 2012-03-09 | Disposition: A | Discharge status: Home / Self Care | Payer: BC Managed Care – PPO | Source: Ambulatory Visit | Attending: Surgery | Admitting: Surgery

## 2012-03-09 HISTORY — DX: Nausea with vomiting, unspecified: R11.2

## 2012-03-09 HISTORY — DX: Malignant (primary) neoplasm, unspecified: C80.1

## 2012-03-09 HISTORY — DX: Diarrhea, unspecified: R19.7

## 2012-03-09 LAB — CBC
HCT: 42 % (ref 36.0–46.0)
Hemoglobin: 13.7 g/dL (ref 12.0–15.0)
MCV: 84.8 fL (ref 78.0–100.0)
Platelets: 318 10*3/uL (ref 150–400)
RBC: 4.95 MIL/uL (ref 3.87–5.11)
WBC: 6.2 10*3/uL (ref 4.0–10.5)

## 2012-03-09 LAB — ABO/RH: ABO/RH(D): O POS

## 2012-03-09 LAB — HCG, SERUM, QUALITATIVE: Preg, Serum: NEGATIVE

## 2012-03-09 LAB — TYPE AND SCREEN

## 2012-03-09 NOTE — Pre-Procedure Instructions (Addendum)
20 Robin Miller  03/09/2012   Your procedure is scheduled on:  Tuesday, November 19th.  Report to [facility] at 5:30 AM.  Call this number if you have problems the morning of surgery: [phone]   Remember:   Do not eat food or anything to drink:After Midnight.     Take these medicines the morning of surgery with A SIP OF WATER: Oxycodone-Acetaminophen (Percocet) if needed.   Do not wear jewelry, make-up or nail polish.  Do not wear lotions, powders, or perfumes. You may wear deodorant.  Do not shave 48 hours prior to surgery. Men may shave face and neck.  Do not bring valuables to the hospital.  Contacts, dentures or bridgework may not be worn into surgery.  Leave suitcase in the car. After surgery it may be brought to your room.  For patients admitted to the hospital, checkout time is 11:00 AM the day of discharge.   Patients discharged the day of surgery will not be allowed to drive home.  Name and phone number of your driver: NA  Special Instructions: Shower using CHG 2 nights before surgery and the night before surgery.  If you shower the day of surgery use CHG.  Use special wash - you have one bottle of CHG for all showers.  You should use approximately 1/3 of the bottle for each shower.  Repeat shower morning of surgery. Stop taking Aspirin, Coumadin, Plavix, Effient and herbal medications.  Do not take any NSAIDS ie:  Ibuprofen, Advil, Naproxen.   Please read over the following fact sheets that you were given: Pain Booklet, Coughing and Deep Breathing, Blood Transfusion Information and Surgical Site Infection Prevention

## 2012-03-15 MED ORDER — DEXTROSE 5 % IV SOLN
2.0000 g | INTRAVENOUS | Status: AC
  Administered 2012-03-16: 2 g via INTRAVENOUS
  Filled 2012-03-15: qty 2

## 2012-03-16 ENCOUNTER — Encounter (HOSPITAL_COMMUNITY): Payer: Self-pay | Admitting: *Deleted

## 2012-03-16 ENCOUNTER — Ambulatory Visit (HOSPITAL_COMMUNITY): Payer: BC Managed Care – PPO | Admitting: Anesthesiology

## 2012-03-16 ENCOUNTER — Inpatient Hospital Stay (HOSPITAL_COMMUNITY)
Admission: RE | Admit: 2012-03-16 | Discharge: 2012-03-23 | DRG: 148 | Disposition: A | Discharge status: Home / Self Care | Payer: BC Managed Care – PPO | Source: Ambulatory Visit | Attending: Surgery | Admitting: Surgery

## 2012-03-16 ENCOUNTER — Encounter (HOSPITAL_COMMUNITY)
Admission: RE | Disposition: A | Discharge status: Home / Self Care | Payer: Self-pay | Source: Ambulatory Visit | Attending: Surgery

## 2012-03-16 ENCOUNTER — Encounter (HOSPITAL_COMMUNITY): Payer: Self-pay | Admitting: General Practice

## 2012-03-16 ENCOUNTER — Encounter (HOSPITAL_COMMUNITY): Payer: Self-pay | Admitting: Anesthesiology

## 2012-03-16 DIAGNOSIS — R112 Nausea with vomiting, unspecified: Secondary | ICD-10-CM | POA: Diagnosis present

## 2012-03-16 DIAGNOSIS — Z85828 Personal history of other malignant neoplasm of skin: Secondary | ICD-10-CM

## 2012-03-16 DIAGNOSIS — Z862 Personal history of diseases of the blood and blood-forming organs and certain disorders involving the immune mechanism: Secondary | ICD-10-CM

## 2012-03-16 DIAGNOSIS — Z9889 Other specified postprocedural states: Secondary | ICD-10-CM

## 2012-03-16 DIAGNOSIS — K5090 Crohn's disease, unspecified, without complications: Secondary | ICD-10-CM

## 2012-03-16 DIAGNOSIS — K632 Fistula of intestine: Secondary | ICD-10-CM | POA: Diagnosis present

## 2012-03-16 DIAGNOSIS — K5080 Crohn's disease of both small and large intestine without complications: Secondary | ICD-10-CM | POA: Diagnosis present

## 2012-03-16 DIAGNOSIS — F3289 Other specified depressive episodes: Secondary | ICD-10-CM | POA: Diagnosis present

## 2012-03-16 DIAGNOSIS — K5000 Crohn's disease of small intestine without complications: Principal | ICD-10-CM | POA: Diagnosis present

## 2012-03-16 DIAGNOSIS — F329 Major depressive disorder, single episode, unspecified: Secondary | ICD-10-CM | POA: Diagnosis present

## 2012-03-16 DIAGNOSIS — K5669 Other intestinal obstruction: Secondary | ICD-10-CM | POA: Diagnosis present

## 2012-03-16 DIAGNOSIS — K50013 Crohn's disease of small intestine with fistula: Secondary | ICD-10-CM

## 2012-03-16 HISTORY — DX: Crohn's disease of small intestine with fistula: K50.013

## 2012-03-16 HISTORY — PX: LAPAROSCOPIC ILEOCECECTOMY: SHX5898

## 2012-03-16 HISTORY — PX: OTHER SURGICAL HISTORY: SHX169

## 2012-03-16 LAB — CBC
Hemoglobin: 11.4 g/dL — ABNORMAL LOW (ref 12.0–15.0)
MCHC: 32.9 g/dL (ref 30.0–36.0)
Platelets: 276 10*3/uL (ref 150–400)
RDW: 13.3 % (ref 11.5–15.5)

## 2012-03-16 LAB — CREATININE, SERUM
Creatinine, Ser: 0.51 mg/dL (ref 0.50–1.10)
GFR calc non Af Amer: >90 mL/min (ref >90)

## 2012-03-16 SURGERY — EXCISION, CECUM WITH ILEUM, LAPAROSCOPIC
Anesthesia: General | Wound class: Clean Contaminated

## 2012-03-16 MED ORDER — MAGIC MOUTHWASH
15.0000 mL | Freq: Four times a day (QID) | ORAL | Status: DC | PRN
Start: 2012-03-16 — End: 2012-03-23
  Filled 2012-03-16: qty 15

## 2012-03-16 MED ORDER — 0.9 % SODIUM CHLORIDE (POUR BTL) OPTIME
TOPICAL | Status: DC | PRN
  Administered 2012-03-16: 1000 mL

## 2012-03-16 MED ORDER — EPHEDRINE SULFATE 50 MG/ML IJ SOLN
INTRAMUSCULAR | Status: DC | PRN
  Administered 2012-03-16: 10 mg via INTRAVENOUS

## 2012-03-16 MED ORDER — OXYCODONE HCL 5 MG PO TABS: 5.0000 mg | ORAL_TABLET | ORAL | Status: DC | PRN

## 2012-03-16 MED ORDER — OXYCODONE HCL 5 MG/5ML PO SOLN: 5.0000 mg | Freq: Once | ORAL | Status: DC | PRN

## 2012-03-16 MED ORDER — LIDOCAINE HCL 4 % MT SOLN
OROMUCOSAL | Status: DC | PRN
  Administered 2012-03-16: 4 mL via TOPICAL

## 2012-03-16 MED ORDER — KETOROLAC TROMETHAMINE 30 MG/ML IJ SOLN
30.0000 mg | Freq: Once | INTRAMUSCULAR | Status: AC
  Administered 2012-03-16: 30 mg via INTRAVENOUS
  Filled 2012-03-16: qty 1

## 2012-03-16 MED ORDER — METOCLOPRAMIDE HCL 5 MG/ML IJ SOLN
INTRAMUSCULAR | Status: AC
  Filled 2012-03-16: qty 2

## 2012-03-16 MED ORDER — ALVIMOPAN 12 MG PO CAPS
12.0000 mg | ORAL_CAPSULE | Freq: Two times a day (BID) | ORAL | Status: DC
  Administered 2012-03-17 – 2012-03-21 (×8): 12 mg via ORAL
  Filled 2012-03-16 (×10): qty 1

## 2012-03-16 MED ORDER — SACCHAROMYCES BOULARDII 250 MG PO CAPS
250.0000 mg | ORAL_CAPSULE | Freq: Two times a day (BID) | ORAL | Status: DC
  Administered 2012-03-16 – 2012-03-23 (×14): 250 mg via ORAL
  Filled 2012-03-16 (×16): qty 1

## 2012-03-16 MED ORDER — LACTATED RINGERS IV SOLN
INTRAVENOUS | Status: DC | PRN
  Administered 2012-03-16 (×2): via INTRAVENOUS

## 2012-03-16 MED ORDER — BUPIVACAINE-EPINEPHRINE PF 0.25-1:200000 % IJ SOLN
INTRAMUSCULAR | Status: AC
  Filled 2012-03-16: qty 30

## 2012-03-16 MED ORDER — HYDROMORPHONE HCL PF 1 MG/ML IJ SOLN
INTRAMUSCULAR | Status: AC
  Filled 2012-03-16: qty 2

## 2012-03-16 MED ORDER — DEXTROSE 5 % IV SOLN
2.0000 g | Freq: Two times a day (BID) | INTRAVENOUS | Status: AC
  Administered 2012-03-16: 2 g via INTRAVENOUS
  Filled 2012-03-16 (×2): qty 2

## 2012-03-16 MED ORDER — NEOSTIGMINE METHYLSULFATE 1 MG/ML IJ SOLN
INTRAMUSCULAR | Status: DC | PRN
  Administered 2012-03-16: 3 mg via INTRAVENOUS

## 2012-03-16 MED ORDER — CHLORHEXIDINE GLUCONATE 4 % EX LIQD: 1.0000 "application " | Freq: Once | CUTANEOUS | Status: DC

## 2012-03-16 MED ORDER — LIP MEDEX EX OINT: 1.0000 "application " | TOPICAL_OINTMENT | Freq: Two times a day (BID) | CUTANEOUS | Status: DC

## 2012-03-16 MED ORDER — FENTANYL CITRATE 0.05 MG/ML IJ SOLN
INTRAMUSCULAR | Status: DC | PRN
  Administered 2012-03-16: 100 ug via INTRAVENOUS
  Administered 2012-03-16 (×2): 50 ug via INTRAVENOUS

## 2012-03-16 MED ORDER — BUPIVACAINE-EPINEPHRINE 0.25% -1:200000 IJ SOLN
INTRAMUSCULAR | Status: DC | PRN
  Administered 2012-03-16: 30 mL

## 2012-03-16 MED ORDER — HYDROMORPHONE HCL PF 1 MG/ML IJ SOLN
0.2500 mg | INTRAMUSCULAR | Status: DC | PRN
  Administered 2012-03-16 (×2): 0.5 mg via INTRAVENOUS
  Administered 2012-03-16: 0.25 mg via INTRAVENOUS
  Administered 2012-03-16: 0.5 mg via INTRAVENOUS

## 2012-03-16 MED ORDER — WHITE PETROLATUM GEL
Status: AC
  Filled 2012-03-16: qty 5

## 2012-03-16 MED ORDER — ACETAMINOPHEN 500 MG PO TABS
1000.0000 mg | ORAL_TABLET | Freq: Three times a day (TID) | ORAL | Status: DC
  Administered 2012-03-16 – 2012-03-21 (×15): 1000 mg via ORAL
  Filled 2012-03-16 (×20): qty 2

## 2012-03-16 MED ORDER — ONDANSETRON HCL 4 MG/2ML IJ SOLN
INTRAMUSCULAR | Status: DC | PRN
  Administered 2012-03-16 (×2): 4 mg via INTRAVENOUS

## 2012-03-16 MED ORDER — BLISTEX EX OINT
TOPICAL_OINTMENT | Freq: Two times a day (BID) | CUTANEOUS | Status: DC
  Administered 2012-03-16 – 2012-03-23 (×15): via TOPICAL
  Filled 2012-03-16: qty 10

## 2012-03-16 MED ORDER — GLYCOPYRROLATE 0.2 MG/ML IJ SOLN
INTRAMUSCULAR | Status: DC | PRN
  Administered 2012-03-16: 0.2 mg via INTRAVENOUS
  Administered 2012-03-16: 0.4 mg via INTRAVENOUS

## 2012-03-16 MED ORDER — PROPOFOL 10 MG/ML IV BOLUS
INTRAVENOUS | Status: DC | PRN
  Administered 2012-03-16: 180 mg via INTRAVENOUS

## 2012-03-16 MED ORDER — BISACODYL 10 MG RE SUPP: 10.0000 mg | Freq: Two times a day (BID) | RECTAL | Status: DC | PRN

## 2012-03-16 MED ORDER — ROCURONIUM BROMIDE 100 MG/10ML IV SOLN
INTRAVENOUS | Status: DC | PRN
  Administered 2012-03-16: 40 mg via INTRAVENOUS

## 2012-03-16 MED ORDER — DIPHENHYDRAMINE HCL 50 MG/ML IJ SOLN
12.5000 mg | Freq: Four times a day (QID) | INTRAMUSCULAR | Status: DC | PRN
  Administered 2012-03-21: 12.5 mg via INTRAVENOUS
  Filled 2012-03-16: qty 1

## 2012-03-16 MED ORDER — LIDOCAINE HCL (CARDIAC) 20 MG/ML IV SOLN
INTRAVENOUS | Status: DC | PRN
  Administered 2012-03-16: 50 mg via INTRAVENOUS

## 2012-03-16 MED ORDER — ALUM & MAG HYDROXIDE-SIMETH 200-200-20 MG/5ML PO SUSP: 30.0000 mL | Freq: Four times a day (QID) | ORAL | Status: DC | PRN

## 2012-03-16 MED ORDER — LACTATED RINGERS IV BOLUS (SEPSIS): 1000.0000 mL | Freq: Three times a day (TID) | INTRAVENOUS | Status: DC | PRN

## 2012-03-16 MED ORDER — METOCLOPRAMIDE HCL 5 MG/ML IJ SOLN
10.0000 mg | Freq: Once | INTRAMUSCULAR | Status: AC | PRN
  Administered 2012-03-16: 10 mg via INTRAVENOUS

## 2012-03-16 MED ORDER — HYDROMORPHONE HCL PF 1 MG/ML IJ SOLN
0.5000 mg | INTRAMUSCULAR | Status: DC | PRN
  Administered 2012-03-16 – 2012-03-17 (×6): 1 mg via INTRAVENOUS
  Filled 2012-03-16 (×7): qty 1

## 2012-03-16 MED ORDER — HEPARIN SODIUM (PORCINE) 5000 UNIT/ML IJ SOLN
5000.0000 [IU] | Freq: Three times a day (TID) | INTRAMUSCULAR | Status: DC
  Administered 2012-03-17 – 2012-03-23 (×19): 5000 [IU] via SUBCUTANEOUS
  Filled 2012-03-16 (×22): qty 1

## 2012-03-16 MED ORDER — PROMETHAZINE HCL 25 MG/ML IJ SOLN
12.5000 mg | Freq: Four times a day (QID) | INTRAMUSCULAR | Status: DC | PRN
  Administered 2012-03-16: 12.5 mg via INTRAVENOUS
  Administered 2012-03-17 – 2012-03-19 (×5): 25 mg via INTRAVENOUS
  Administered 2012-03-19: 12.5 mg via INTRAVENOUS
  Administered 2012-03-19 – 2012-03-21 (×4): 25 mg via INTRAVENOUS
  Filled 2012-03-16 (×11): qty 1

## 2012-03-16 MED ORDER — MIDAZOLAM HCL 5 MG/5ML IJ SOLN
INTRAMUSCULAR | Status: DC | PRN
  Administered 2012-03-16: 2 mg via INTRAVENOUS

## 2012-03-16 MED ORDER — ETONOGESTREL-ETHINYL ESTRADIOL 0.12-0.015 MG/24HR VA RING: 1.0000 | VAGINAL_RING | VAGINAL | Status: DC

## 2012-03-16 MED ORDER — OXYCODONE HCL 5 MG PO TABS: 5.0000 mg | ORAL_TABLET | Freq: Once | ORAL | Status: DC | PRN

## 2012-03-16 MED ORDER — LACTATED RINGERS IV SOLN
INTRAVENOUS | Status: DC
  Administered 2012-03-17: 19:00:00 via INTRAVENOUS
  Administered 2012-03-18: 75 mL/h via INTRAVENOUS
  Administered 2012-03-18: 08:00:00 via INTRAVENOUS

## 2012-03-16 SURGICAL SUPPLY — 78 items
APPLICATOR COTTON TIP 6IN STRL (MISCELLANEOUS) IMPLANT
APPLIER CLIP ROT 10 11.4 M/L (STAPLE)
BLADE SURG 10 STRL SS (BLADE) ×2 IMPLANT
CANISTER SUCTION 2500CC (MISCELLANEOUS) ×2 IMPLANT
CELLS DAT CNTRL 66122 CELL SVR (MISCELLANEOUS) ×1 IMPLANT
CHLORAPREP W/TINT 26ML (MISCELLANEOUS) ×2 IMPLANT
CLIP APPLIE ROT 10 11.4 M/L (STAPLE) IMPLANT
CLOTH BEACON ORANGE TIMEOUT ST (SAFETY) ×2 IMPLANT
COVER SURGICAL LIGHT HANDLE (MISCELLANEOUS) ×2 IMPLANT
DECANTER SPIKE VIAL GLASS SM (MISCELLANEOUS) ×2 IMPLANT
DRAPE INCISE IOBAN 66X45 STRL (DRAPES) IMPLANT
DRAPE PROXIMA HALF (DRAPES) ×4 IMPLANT
DRAPE WARM FLUID 44X44 (DRAPE) ×2 IMPLANT
DRSG TEGADERM 4X4.75 (GAUZE/BANDAGES/DRESSINGS) ×2 IMPLANT
ELECT CAUTERY BLADE 6.4 (BLADE) ×4 IMPLANT
ELECT REM PT RETURN 9FT ADLT (ELECTROSURGICAL) ×2
ELECTRODE REM PT RTRN 9FT ADLT (ELECTROSURGICAL) ×1 IMPLANT
GAUZE SPONGE 2X2 8PLY STRL LF (GAUZE/BANDAGES/DRESSINGS) ×1 IMPLANT
GEL ULTRASOUND 20GR AQUASONIC (MISCELLANEOUS) IMPLANT
GLOVE BIO SURGEON STRL SZ7.5 (GLOVE) ×4 IMPLANT
GLOVE BIOGEL PI IND STRL 7.0 (GLOVE) ×4 IMPLANT
GLOVE BIOGEL PI IND STRL 8 (GLOVE) ×2 IMPLANT
GLOVE BIOGEL PI INDICATOR 7.0 (GLOVE) ×4
GLOVE BIOGEL PI INDICATOR 8 (GLOVE) ×2
GLOVE ECLIPSE 8.0 STRL XLNG CF (GLOVE) ×6 IMPLANT
GLOVE SS BIOGEL STRL SZ 6.5 (GLOVE) ×1 IMPLANT
GLOVE SUPERSENSE BIOGEL SZ 6.5 (GLOVE) ×1
GLOVE SURG SS PI 7.0 STRL IVOR (GLOVE) ×4 IMPLANT
GOWN PREVENTION PLUS XLARGE (GOWN DISPOSABLE) ×2 IMPLANT
GOWN STRL NON-REIN LRG LVL3 (GOWN DISPOSABLE) ×6 IMPLANT
KIT BASIN OR (CUSTOM PROCEDURE TRAY) ×2 IMPLANT
KIT ROOM TURNOVER OR (KITS) ×2 IMPLANT
LEGGING LITHOTOMY PAIR STRL (DRAPES) ×2 IMPLANT
LIGASURE 5MM LAPAROSCOPIC (INSTRUMENTS) IMPLANT
NEEDLE 22X1 1/2 (OR ONLY) (NEEDLE) ×2 IMPLANT
NS IRRIG 1000ML POUR BTL (IV SOLUTION) ×4 IMPLANT
PAD ARMBOARD 7.5X6 YLW CONV (MISCELLANEOUS) ×4 IMPLANT
PENCIL BUTTON HOLSTER BLD 10FT (ELECTRODE) ×2 IMPLANT
RTRCTR WOUND ALEXIS 18CM MED (MISCELLANEOUS) ×2
SCALPEL HARMONIC ACE (MISCELLANEOUS) ×2 IMPLANT
SCISSORS LAP 5X35 DISP (ENDOMECHANICALS) IMPLANT
SET IRRIG TUBING LAPAROSCOPIC (IRRIGATION / IRRIGATOR) ×2 IMPLANT
SLEEVE ADV FIXATION 5X100MM (TROCAR) ×6 IMPLANT
SPECIMEN JAR LARGE (MISCELLANEOUS) ×2 IMPLANT
SPONGE GAUZE 2X2 STER 10/PKG (GAUZE/BANDAGES/DRESSINGS) ×1
SPONGE GAUZE 4X4 12PLY (GAUZE/BANDAGES/DRESSINGS) ×2 IMPLANT
STAPLER 90 3.5 STAND SLIM (STAPLE) ×2
STAPLER 90 3.5 STD SLIM (STAPLE) ×1 IMPLANT
STAPLER PROXIMATE 75MM BLUE (STAPLE) ×2 IMPLANT
STAPLER VISISTAT 35W (STAPLE) ×2 IMPLANT
SURGILUBE 2OZ TUBE FLIPTOP (MISCELLANEOUS) ×2 IMPLANT
SUT MNCRL AB 4-0 PS2 18 (SUTURE) ×4 IMPLANT
SUT PDS AB 1 CT  36 (SUTURE) ×1
SUT PDS AB 1 CT 36 (SUTURE) ×1 IMPLANT
SUT PROLENE 2 0 CT2 30 (SUTURE) ×2 IMPLANT
SUT PROLENE 2 0 KS (SUTURE) IMPLANT
SUT SILK 2 0 (SUTURE) ×1
SUT SILK 2 0 SH CR/8 (SUTURE) ×2 IMPLANT
SUT SILK 2-0 18XBRD TIE 12 (SUTURE) ×1 IMPLANT
SUT SILK 3 0 (SUTURE) ×1
SUT SILK 3 0 SH CR/8 (SUTURE) ×2 IMPLANT
SUT SILK 3-0 18XBRD TIE 12 (SUTURE) ×1 IMPLANT
SUT VIC AB 3-0 SH 18 (SUTURE) ×2 IMPLANT
SUT VICRYL AB 2 0 TIES (SUTURE) ×2 IMPLANT
SYS LAPSCP GELPORT 120MM (MISCELLANEOUS)
SYSTEM LAPSCP GELPORT 120MM (MISCELLANEOUS) IMPLANT
TAPE UMBILICAL COTTON 1/8X30 (MISCELLANEOUS) ×2 IMPLANT
TOWEL OR 17X24 6PK STRL BLUE (TOWEL DISPOSABLE) ×2 IMPLANT
TOWEL OR 17X26 10 PK STRL BLUE (TOWEL DISPOSABLE) ×2 IMPLANT
TRAY FOLEY CATH 14FRSI W/METER (CATHETERS) ×2 IMPLANT
TRAY LAPAROSCOPIC (CUSTOM PROCEDURE TRAY) ×2 IMPLANT
TRAY PROCTOSCOPIC FIBER OPTIC (SET/KITS/TRAYS/PACK) ×2 IMPLANT
TROCAR XCEL NON-BLD 5MMX100MML (ENDOMECHANICALS) ×2 IMPLANT
TROCAR Z-THREAD FIOS 5X100MM (TROCAR) ×2 IMPLANT
TUBE CONNECTING 12X1/4 (SUCTIONS) ×2 IMPLANT
TUBING FILTER THERMOFLATOR (ELECTROSURGICAL) ×2 IMPLANT
WATER STERILE IRR 1000ML POUR (IV SOLUTION) IMPLANT
YANKAUER SUCT BULB TIP NO VENT (SUCTIONS) ×4 IMPLANT

## 2012-03-16 NOTE — Anesthesia Postprocedure Evaluation (Signed)
Anesthesia Post Note  Patient: [name]  Procedure(s) Performed: Procedure(s) (LRB): LAPAROSCOPIC ILEOCECECTOMY (N/A)  Anesthesia type: general  Patient location: PACU  Post pain: Pain level controlled  Post assessment: Patient's Cardiovascular Status Stable  Last Vitals:  Filed Vitals:   03/16/12 1036  BP:   Pulse: 82  Temp:   Resp: 20    Post vital signs: Reviewed and stable  Level of consciousness: sedated  Complications: No apparent anesthesia complications

## 2012-03-16 NOTE — H&P (Signed)
Robin Miller  1990-09-19 [id number]  Patient Care Team: [name], MD as PCP - General (Internal Medicine) [name], MD as Consulting Physician (General Surgery) [name], MD,FACG as Consulting Physician (Gastroenterology)  Reason for evaluation: Crohn's terminal ileitis. Persistent symptoms despite immunosuppression. Consideration of segmental resection   Pleasant college student. Noticed in May worsening abdominal discomfort and intermittent loose bowels. Came emergency room. Concern of appendicitis. Underwent CAT scan. Found to have terminal ileum suspicious for Crohn's. Followed up with [place] gastro[facility]ology. [name] performed endoscopy in July. Did biopsies. Confirmed chronic active ileitis strongly suggestive of Crohn's. No other disease elsewhere. The patient has tried different rounds of steroids. Currently on Entocort 3 mg 3 times a day. She has been on that for two months. No improvement in symptoms. Having 5-6 loose bowel movements a day. Intermittent nausea vomiting and severe pain. Vomiting about every other day now   She denies any joint pain. No fevers. No history of ulcers. No skin rashes. Has a father with Crohn's disease. Sounds like his is been mild. No other family history of bowel problems. No personal nor family history of GI/colon cancer, irritable bowel syndrome, allergy such as Celiac Sprue, dietary/dairy problems, colitis, ulcers nor gastritis. No recent sick contacts/gastroenteritis. No travel outside the country. No changes in diet.   Because of her poor tolerance to certain anti-inflammatory regimens, no improvement And now actually worsening vomiting after several months of her current regimen, she was sent to me to see if segmental resection would be of benefit.  No changes in events since last visit last month   *RADIOLOGY REPORT*  Clinical Data: Right lower quadrant pain  CT ABDOMEN AND PELVIS WITH CONTRAST  Technique: Multidetector CT  imaging of the abdomen and pelvis was  performed following the standard protocol during bolus  administration of intravenous contrast.  Contrast: OMNIPAQUE IOHEXOL 300 MG/ML SOLN  Comparison: None.  Findings: Limited images through the lung bases demonstrate no  significant appreciable abnormality. The heart size is within  normal limits. No pleural or pericardial effusion.  Unremarkable liver, biliary system, spleen, pancreas, adrenal  glands.  Symmetric renal enhancement. No hydronephrosis or hydroureter.  No bowel obstruction. No CT evidence for colitis. The appendix is  within normal limits. There is marked thickening and inflammatory  changes involving the terminal ileum. Prominent ileocolic lymph  nodes. Mild associated perienteric fat stranding. Fecalization of  distal ileum just proximal to the TI suggests delayed transit.  No free intraperitoneal air. Trace free fluid within the pelvis.  Normal caliber vasculature.  Unremarkable CT appearance to the uterus and adnexa. Thin-walled  bladder.  No acute osseous finding.  IMPRESSION:  Thickened terminal ileum with mucosal hyperenhancement is in  keeping with a nonspecific ileitis. Differential includes  infectious, inflammatory, and ischemic etiologies. Given the  appearance and the patient's age, Crohn disease should be  considered.  Original Report Authenticated By: Waneta Martins, M.D.        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  - CMV STAIN IS NEGATIVE.  - NO DYSPLASIA OR MALIGNANCY IDENTIFIED.  - SEE COMMENT.  Microscopic Comment  Based on the histologic findings, Crohn's disease is favored. Please correlate with endoscopic impression.  Some of the tissue fragments present consist entirely of granulation tissue. If a polyp was seen clinically, the  findings would  support a granulation  tissue inflammatory-type polyp. There is no dysplasia or malignancy  identified. Dr. Frederica Kuster has seen this case in consultation with agreement. (RAH:gt, 11/19/11)  Zandra Abts MD  Pathologist, Electronic Signature  (Case signed 07/24/201   Patient Active Problem List  Diagnosis  . History of anemia  . Right shoulder pain  . Crohn's ileocolitis  . Medication side effect    Past Medical History  Diagnosis Date  . Depression   . Vasovagal syncope   . Crohn's ileitis   . Anemia   . Headache     last one 03/08/12  . Nausea vomiting and diarrhea   . Abdominal pain   . Cancer     basal cell back and face    Past Surgical History  Procedure Date  . Tonsillectomy and adenoidectomy   . Skin cancer excision     History   Social History  . Marital Status: Single    Spouse Name: N/A    Number of Children: 0  . Years of Education: N/A   Occupational History  . student    Social History Main Topics  . Smoking status: Never Smoker   . Smokeless tobacco: Never Used  . Alcohol Use: No  . Drug Use: No  . Sexually Active: Yes    Birth Control/ Protection: Inserts   Other Topics Concern  . Not on file   Social History Narrative  . No narrative on file    Family History  Problem Relation Age of Onset  . Stroke Mother   . Breast cancer Mother   . Breast cancer Paternal Aunt   . Crohn's disease Father   . Colitis Maternal Aunt   . Colon cancer Paternal Grandmother     ? cancer, part of colon removed    Current Facility-Administered Medications  Medication Dose Route Frequency Provider Last Rate Last Dose  . cefoTEtan (CEFOTAN) 2 g in dextrose 5 % 50 mL IVPB  2 g Intravenous On Call to OR [name], MD      . chlorhexidine (HIBICLENS) 4 % liquid 1 application  1 application Topical Once [name], MD      . chlorhexidine (HIBICLENS) 4 % liquid 1 application  1 application Topical Once [name], MD         Allergies    Allergen Reactions  . Prednisone     Blurred vision, ringing in ears, insomnia, shaky, body convulsions,nausea    BP 122/77  Pulse 85  Temp 98.1 F (36.7 C) (Oral)  Resp 18  SpO2 99%  LMP 03/01/2012  Review of Systems  Constitutional: Negative for fever, chills, diaphoresis, appetite change and fatigue.  HENT: Negative for ear pain, congestion, sore throat, mouth sores, trouble swallowing, neck pain and ear discharge.  Eyes: Negative for photophobia, discharge and visual disturbance.  Respiratory: Negative for cough, choking, chest tightness and shortness of breath.  Cardiovascular: Negative for chest pain and palpitations.  Gastrointestinal: Positive for nausea, vomiting, abdominal pain, diarrhea and abdominal distention. Negative for constipation, anal bleeding and rectal pain.  Genitourinary: Negative for dysuria, frequency and difficulty urinating.  Musculoskeletal: Negative for myalgias, arthralgias and gait problem.  Skin: Negative for color change, pallor, rash and wound.  Neurological: Negative for dizziness, speech difficulty, weakness and numbness.  Hematological: Negative for adenopathy.  Psychiatric/Behavioral: Negative for confusion and agitation. The patient is not nervous/anxious.  Objective:   Physical Exam  Constitutional: She is oriented to person, place, and time. She appears well-developed and well-nourished.  No distress.  HENT:  Head: Normocephalic.  Mouth/Throat: Oropharynx is clear and moist. No oropharyngeal exudate.  Eyes: Conjunctivae normal and EOM are normal. Pupils are equal, round, and reactive to light. No scleral icterus.  Neck: Normal range of motion. Neck supple. No tracheal deviation present.  Cardiovascular: Normal rate, regular rhythm and intact distal pulses.  Pulmonary/Chest: Effort normal and breath sounds normal. No respiratory distress. She exhibits no tenderness.  Abdominal: Soft. She exhibits no distension and no mass. There is no  rebound and no guarding. Hernia confirmed negative in the right inguinal area and confirmed negative in the left inguinal area.  Mild RLQ discomfort  Genitourinary: No vaginal discharge found.  Musculoskeletal: Normal range of motion. She exhibits no tenderness.  Lymphadenopathy:  She has no cervical adenopathy.  Right: No inguinal adenopathy present.  Left: No inguinal adenopathy present.  Neurological: She is alert and oriented to person, place, and time. No cranial nerve deficit. She exhibits normal muscle tone. Coordination normal.  Skin: Skin is warm and dry. No rash noted. She is not diaphoretic. No erythema.  Psychiatric: She has a normal mood and affect. Her behavior is normal. Judgment and thought content normal.   Assessment:   Crohn's disease with terminal ileal thickening. Poor tolerance to certain anti-inflammatory regimens. Worsening vomiting and persistent other symptoms on steroids for several months.   Plan:   I long discussion with the patient. Also discussed with her new colorectal partner, Dr. Romie Levee. My inclination is to resect the problem area since that is the only nidus of inflammation of her Crohn's. She is failed medical therapy. If she insisted on maximizing medical options, consider a trial of Remicade to see if that would finally work. However, Dr. Maisie Fus and I suspect that need for resection is imminent. It seems the other route of resecting disease and then proceeding with an aggressive anti-inflammatory regimen to keep disease suppressed is a more reasonable approach. I worry that the patient could continue to lose weight and get into a situation of severe malnourishment. Risks of leak and ileostomy would be much higher.  The patient is more inclined to proceed with surgery to avoid getting worse.I did stress to her the importance of having regular followup with a gastroenterologist to keep the Crohn's disease in check. If she disappears, need for repeated  resections is much more likely. Could progress to a point of short gut. If she can be followed by gastroenterology, she is the best shot at keeping the Crohn's disease suppressed and maintaining adequate bowel length and avoiding obstructive and other debilitating problems.  She has never had bowel surgery before. Good candidate for a laparoscopically assisted approach. We will try and hide her extraction incision within her umbilicus  The anatomy & physiology of the digestive tract was discussed. The pathophysiology was discussed. Natural history risks without surgery was discussed. I feel the risks of no intervention will lead to serious problems that outweigh the operative risks; therefore, I recommended a partial bowel resection to remove the pathology. Laparoscopic & open techniques were discussed.  Risks such as bleeding, infection, abscess, leak, reoperation, possible ostomy, hernia, heart attack, death, and other risks were discussed. I noted a good likelihood this will help address the problem. Goals of post-operative recovery were discussed as well. We will work to minimize complications. An educational handout on the pathology was given as well. Questions were answered. The patient expresses understanding & wishes to proceed with surgery.  I have re-reviewed the the  patient's records, history, medications, and allergies.  I have re-examined the patient.  I again discussed intraoperative plans and goals of post-operative recovery.  The patient agrees to proceed.

## 2012-03-16 NOTE — Transfer of Care (Signed)
Immediate Anesthesia Transfer of Care Note  Patient: Robin Miller  Procedure(s) Performed: Procedure(s) (LRB) with comments: LAPAROSCOPIC ILEOCECECTOMY (N/A) - lap assisted ileocectomy  Patient Location: PACU  Anesthesia Type:General  Level of Consciousness: awake, alert  and oriented  Airway & Oxygen Therapy: Patient Spontanous Breathing and Patient connected to nasal cannula oxygen  Post-op Assessment: Report given to PACU RN, Post -op Vital signs reviewed and stable and Patient moving all extremities X 4  Post vital signs: Reviewed and stable  Complications: No apparent anesthesia complications

## 2012-03-16 NOTE — Preoperative (Signed)
Beta Blockers   Reason not to administer Beta Blockers:Not Applicable 

## 2012-03-16 NOTE — Brief Op Note (Signed)
03/16/2012  9:17 AM  PATIENT:  Robin Miller  21 y.o. female  Patient Care Team: [name], MD as PCP - General (Internal Medicine) [name], MD as Consulting Physician (General Surgery) [name], MD,FACG as Consulting Physician (Gastroenterology)   PRE-OPERATIVE DIAGNOSIS:  chrohns ileitis with stricture  POST-OPERATIVE DIAGNOSIS:  chrohns ileitis with stricture of ileocecal valve  PROCEDURE:  Procedure(s) (LRB) with comments: LAPAROSCOPIC ILEOCECECTOMY (N/A) - lap assisted ileocectomy  SURGEON:  Surgeon(s) and Role:    * [name], MD - Primary    * Axel Filler, MD - Assisting  ANESTHESIA:   local and general  EBL:  Total I/O In: 1000 [I.V.:1000] Out: 275 [Urine:275]  BLOOD ADMINISTERED:none  DRAINS: none   LOCAL MEDICATIONS USED:  BUPIVICAINE   SPECIMEN:  Source of Specimen:  Ileocecum  DISPOSITION OF SPECIMEN:  PATHOLOGY  COUNTS:  YES  TOURNIQUET:  * No tourniquets in log *  DICTATION: .Other Dictation: Dictation Number [id number]  PLAN OF CARE: Admit to inpatient   PATIENT DISPOSITION:  PACU - hemodynamically stable.   Delay start of Pharmacological VTE agent (>24hrs) due to surgical blood loss or risk of bleeding: no

## 2012-03-16 NOTE — Progress Notes (Signed)
Care of pt assumed by MA Siniyah Evangelist RN 

## 2012-03-16 NOTE — Anesthesia Procedure Notes (Signed)
Procedure Name: Intubation Date/Time: 03/16/2012 7:30 AM Performed by: Quentin Ore Pre-anesthesia Checklist: Patient identified, Emergency Drugs available, Suction available, Patient being monitored and Timeout performed Patient Re-evaluated:Patient Re-evaluated prior to inductionOxygen Delivery Method: Circle system utilized Preoxygenation: Pre-oxygenation with 100% oxygen Intubation Type: IV induction Ventilation: Mask ventilation without difficulty Laryngoscope Size: Mac and 3 Grade View: Grade I Tube type: Oral Tube size: 7.5 mm Number of attempts: 1 Airway Equipment and Method: Stylet and LTA kit utilized Placement Confirmation: ETT inserted through vocal cords under direct vision,  positive ETCO2 and breath sounds checked- equal and bilateral Secured at: 21 cm Tube secured with: Tape Dental Injury: Teeth and Oropharynx as per pre-operative assessment

## 2012-03-16 NOTE — Op Note (Addendum)
NAMESYANNA, REMMERT NO.:  [id number]  MEDICAL RECORD NO.:  [id number]  LOCATION:                                 FACILITY:  PHYSICIAN:  [name], MD     DATE OF BIRTH:  1990/08/21  DATE OF PROCEDURE:  03/16/2012 DATE OF DISCHARGE:                              OPERATIVE REPORT   PRIMARY CARE PHYSICIAN:  Luanna Cole. Lenord Fellers, M.D.  GASTROENTEROLOGIST:  Venita Lick. Russella Dar, MD, Clementeen Graham  SURGEON:  [name], MD  ASSISTANT:  Axel Filler, MD  PREOPERATIVE DIAGNOSIS:  Crohn's disease with recurrent ileal inflammation and stricture with obstructive symptoms.  POSTOPERATIVE DIAGNOSIS:  Crohn's disease with stricture at the ileocecal valve and intermittent obstructive symptoms.  PROCEDURE PERFORMED:    Diagnostic laparoscopy Laparoscopically assisted ileocecectomy.  ANESTHESIA: 1. General anesthesia. 2. Local anesthetic and a field block around all port sites and     extraction sites.  SPECIMEN:  Ileocecum.  DRAINS:  None.  ESTIMATED BLOOD LOSS:  Minimal.  COMPLICATIONS:  None apparent.  INDICATIONS:  This gentleman is a pleasant 21 year old female who has had Crohn's disease.  She has had intermittent flares.  She has had dwindling tolerance to immunosuppression including to steroids.  She had nausea,  vomiting now progressive several times a week.  She has been on different anti-inflammatory regimens, but still with some concern & poor tolerance to medical regimens.  She had a CAT scan a few weeks ago that showed evidence of terminal ileal thickening and probable structure.  After long discussions with Gastroenterology and Surgery, recommended consideration of a laparoscopically assisted resection of probably chronic strictured Segment.  Technique, Risks, benefits and alternatives were discussed in detail. Questions answered.  She agreed to proceed.  OPERATIVE FINDINGS: No adhesions.  She had some mild fecalization of her ileum.  She  had some mild lymphadenopathy of her ileal mesentery.  She had a short segment of fibrosis at the ileocecal valve.  No evidence of any other stricturing.  No major inflammation of her terminal ileum.  No strong evidence of Crohn's colitis or other intra-abdominal pathology.  DESCRIPTION OF PROCEDURE:  Informed consent was confirmed.  The patient underwent anesthesia without difficulty.  She was placed on the anti- ileus protocol.  She received IV antibiotics prior to incision.  She was positioned in low lithotomy with arms tucked.  She had a Foley catheter sterilely placed.  Her abdomen was prepped and draped in a sterile fashion.  Surgical time-out confirmed our plan.  I placed a 5-mm port in the superior part of the umbilicus using optical entry technique with a towel clamp holding the umbilical stalk for facial counter traction.  The entry was clean.  Camera inspection revealed no injury.  We induced carbon dioxide insufflation.  The patient became bradycardic.  We evacuated carbon dioxide and  anesthesia was able to correct that in more normal range.  We induced insufflation more slowly.  She had had no hemodynamic issues for the rest of the case.  I placed 5 mm ports in the left and right suprapubic region.  I found the ileocecal region.  Her terminal ileum did not seem  particularly inflamed.  Mild creeping fat.  I ran the small bowel more proximally.  We saw some areas of narrowing that were suspicious for a possible stricture especially in the ileum few feet proximal to the ileocecal valve.  I did run ligament of Treitz and proximal 80% of the small intestine looked normal and unaffected.  I did not see any obvious abnormalities in the colon from the cecum to the peritoneal reflection of the rectum.  We decided to do mobilize the ileocecal region for better investigation since it seemed there was some inflammation and swelling around the cecum.  I mobilized the terminal ileum  mesentery off the pelvic brim and up the right pericolic gutter at the line of Toldt.  We got up to the hepatic flexure that provided a good mobilization.  We created an extraction incision via cutting through the umbilicus vertically again from the fascia for about a 7 cm fascial defect and placed a wound protector.  We were able to easily eviscerate the small bowel.  I ran the small bowel from the ileocecal valve to the ligament of Treitz.  We found no strictures and no abnormalities.  Just segmental peristalsis.  We ran it several times.  I could feel an obvious narrow stricturing of the ileocecal valve.  It was rather fibrotic.  There was some fullness and thickness of the cecal mesentery as well.  Because she had intermittent obstructive symptoms and the only abnormality seem to be at the ileocecal valve, and it seemed more of a fibrotic appearance and not inflammatory; we decided to proceed with ileocecal resection.  I did a side-to-side stapled anastomosis using a 75 GA going from the ascending colon to the ileum about 10 cm proximal to the ileocecal valve.  We placed a crotch stitch using Vicryl stitch.  We stapled off the common defect using a TA 90  getting a good nice wide open anastomosis.  Anastomosis was about 50% wider than the lumen of the ileum at ileocolonic  anastomosis.  We took the mesentery using clamps and 2-0 Vicryl ties.  We closed the common defects tying those ties together plus using a 3-0 Vicryl interrupted stitches.  The anastomosis laid well and rested in the right upper quadrant.  We did copious irrigation of saline and ensured hemostasis.  I closed the midline fascia using #1 running PDS and  reapproximated the skin of the port sites.  I did leave a few small gaps after reapproximating the umbilical skin with 4-0 Monocryl stitch and placed a Betadine-soaked umbilical tape wicks. The patient was seen and was stable throughout the case and did well. She  is being extubated to the recovery room.  I am about to discuss operative findings to the patient's family and have a copy sent back to Dr. Lenord Fellers and [name].     [name], MD     SCG/MEDQ  D:  03/16/2012  T:  03/16/2012  Job:  [id number]

## 2012-03-16 NOTE — Progress Notes (Signed)
PT  HAS REFUSED SEVERAL TIMES TO GET OOB OR DANGLE.

## 2012-03-16 NOTE — Anesthesia Preprocedure Evaluation (Addendum)
Anesthesia Evaluation  Patient identified by MRN, date of birth, ID band Patient awake    Reviewed: Allergy & Precautions, H&P , NPO status , Patient's Chart, lab work & pertinent test results, reviewed documented beta blocker date and time   Airway Mallampati: II TM Distance: >3 FB Neck ROM: full    Dental  (+) Teeth Intact   Pulmonary neg pulmonary ROS,  breath sounds clear to auscultation        Cardiovascular negative cardio ROS  Rhythm:regular     Neuro/Psych  Headaches, PSYCHIATRIC DISORDERS    GI/Hepatic negative GI ROS, Neg liver ROS,   Endo/Other  negative endocrine ROS  Renal/GU negative Renal ROS  negative genitourinary   Musculoskeletal   Abdominal   Peds  Hematology negative hematology ROS (+)   Anesthesia Other Findings See surgeon's H&P   Reproductive/Obstetrics negative OB ROS                          Anesthesia Physical Anesthesia Plan  ASA: II  Anesthesia Plan: General   Post-op Pain Management:    Induction: Intravenous  Airway Management Planned: Oral ETT  Additional Equipment:   Intra-op Plan:   Post-operative Plan: Extubation in OR  Informed Consent: I have reviewed the patients History and Physical, chart, labs and discussed the procedure including the risks, benefits and alternatives for the proposed anesthesia with the patient or authorized representative who has indicated his/her understanding and acceptance.   Dental Advisory Given  Plan Discussed with: CRNA and Surgeon  Anesthesia Plan Comments:         Anesthesia Quick Evaluation

## 2012-03-16 NOTE — OR Nursing (Signed)
Total of 1.75 mg IV Dilaudid was given for pain in PACU. In the Pyxsis, it was entered that .75 mg was wasted, but actual amt. wasted was .25 mg.

## 2012-03-16 NOTE — Progress Notes (Signed)
ARRIVED TO ROOM 6N#12, MOVED SELF TO BED WITHOUT DIFFICULTY, ORIENTED TO ROOM AND SURROUNDING, FATHER AT BEDSIDE. DENIES NAUSEA, C/O ABD PAIN 5/10.

## 2012-03-17 DIAGNOSIS — K50013 Crohn's disease of small intestine with fistula: Secondary | ICD-10-CM | POA: Diagnosis present

## 2012-03-17 DIAGNOSIS — E876 Hypokalemia: Secondary | ICD-10-CM

## 2012-03-17 HISTORY — DX: Crohn's disease of small intestine with fistula: K50.013

## 2012-03-17 LAB — CBC
MCHC: 32.4 g/dL (ref 30.0–36.0)
RDW: 13.4 % (ref 11.5–15.5)

## 2012-03-17 LAB — BASIC METABOLIC PANEL
BUN: 3 mg/dL — ABNORMAL LOW (ref 6–23)
Creatinine, Ser: 0.57 mg/dL (ref 0.50–1.10)
GFR calc Af Amer: >90 mL/min (ref >90)
GFR calc non Af Amer: >90 mL/min (ref >90)

## 2012-03-17 MED ORDER — FENTANYL CITRATE 0.05 MG/ML IJ SOLN
25.0000 ug | INTRAMUSCULAR | Status: DC | PRN
  Administered 2012-03-17 – 2012-03-19 (×7): 50 ug via INTRAVENOUS
  Filled 2012-03-17 (×7): qty 2

## 2012-03-17 MED ORDER — ETONOGESTREL-ETHINYL ESTRADIOL 0.12-0.015 MG/24HR VA RING: 1.0000 | VAGINAL_RING | VAGINAL | Status: DC

## 2012-03-17 MED ORDER — POTASSIUM CHLORIDE CRYS ER 20 MEQ PO TBCR
40.0000 meq | EXTENDED_RELEASE_TABLET | Freq: Every day | ORAL | Status: AC
  Administered 2012-03-17 – 2012-03-19 (×3): 40 meq via ORAL
  Filled 2012-03-17 (×3): qty 2

## 2012-03-17 NOTE — Progress Notes (Signed)
Robin Miller [id number] Aug 27, 1990   Subjective:  Sore No N/V Sleepy w IV Dilaudid  Objective:  Vital signs:  Filed Vitals:   03/16/12 1758 03/16/12 2137 03/17/12 0132 03/17/12 0606  BP: 121/69 121/69 122/75 99/72  Pulse: 100 100 115 101  Temp: 99.7 F (37.6 C) 99.7 F (37.6 C) 99.9 F (37.7 C) 99.6 F (37.6 C)  TempSrc: Oral Oral Oral Oral  Resp: 18 18 20 18   Height:      Weight:      SpO2: 95% 97% 97% 94%       Intake/Output   Yesterday:  11/19 0701 - 11/20 0700 In: 2735 [I.V.:2585] Out: 2010 [Urine:1960; Blood:50] This shift:     Bowel function:  Flatus: n  BM: n  Physical Exam:  General: Pt awake/alert/oriented x4 in no acute distress Eyes: PERRL, normal EOM.  Sclera clear.  No icterus Neuro: CN II-XII intact w/o focal sensory/motor deficits. Lymph: No head/neck/groin lymphadenopathy Psych:  No delerium/psychosis/paranoia HENT: Normocephalic, Mucus membranes moist.  No thrush Neck: Supple, No tracheal deviation Chest: No chest wall pain w good excursion CV:  Pulses intact.  Regular rhythm MS: Normal AROM mjr joints.  No obvious deformity Abdomen: Soft.  Nondistended.  Mildly tender at incisions only.  No incarcerated hernias. Ext:  SCDs BLE.  No mjr edema.  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 female  1 Day Post-Op  Procedure(s): LAPAROSCOPIC ILEOCECECTOMY  Sore but otherwise stable  Plan:  -low K - replace -adv diet gradually Anti-ileus protocol -f/u pathology  -change to fentanyl.  Cont non-narcotic pain control -VTE prophylaxis- SCDs, etc -mobilize as tolerated to help recovery  [name], M.D., F.A.C.S. Gastrointestinal and Minimally Invasive [facility] [address], [address] [place], [place] [address] [phone] Main / Paging [phone] Voice Mail   03/17/2012  CARE TEAM:  PCP: [name], MD  Outpatient Care Team: Patient Care Team: [name], MD as PCP - General (Internal Medicine) [name], MD as Consulting Physician (General Surgery) [name], MD,FACG as Consulting Physician (Gastroenterology)  Inpatient Treatment Team: Treatment Team: Attending Provider: [name], MD   Results:   Labs: Results for orders placed during the hospital encounter of 03/16/12 (from the past 48 hour(s))  CBC     Status: Abnormal   Collection Time   03/16/12 12:30 PM      Component Value Range Comment   WBC 17.2 (*) 4.0 - 10.5 K/uL    RBC 4.12  3.87 - 5.11 MIL/uL    Hemoglobin 11.4 (*) 12.0 - 15.0 g/dL    HCT 34.7 (*) 42.5 - 46.0 %    MCV 84.2  78.0 - 100.0 fL    MCH 27.7  26.0 - 34.0 pg    MCHC 32.9  30.0 - 36.0 g/dL    RDW 95.6  38.7 - 56.4 %    Platelets 276  150 - 400 K/uL   CREATININE, SERUM     Status: Normal   Collection Time   03/16/12 12:30 PM      Component Value Range Comment   Creatinine, Ser 0.51  0.50 - 1.10 mg/dL    GFR calc non Af Amer >90  >90 mL/min    GFR  calc Af Amer >90  >90 mL/min   BASIC METABOLIC PANEL     Status: Abnormal   Collection Time   03/17/12  5:40 AM      Component Value Range Comment   Sodium 134 (*) 135 - 145 mEq/L    Potassium 3.2 (*) 3.5 - 5.1 mEq/L    Chloride 101  96 - 112 mEq/L    CO2 21  19 - 32 mEq/L    Glucose, Bld 96  70 - 99 mg/dL    BUN 3 (*) 6 - 23 mg/dL    Creatinine, Ser 4.09  0.50 - 1.10 mg/dL    Calcium 8.8  8.4 - 81.1 mg/dL    GFR calc non Af Amer >90  >90 mL/min    GFR calc Af Amer >90  >90 mL/min   CBC     Status: Abnormal   Collection Time   03/17/12  5:40 AM      Component Value Range Comment   WBC 13.5 (*) 4.0 - 10.5 K/uL    RBC 4.00  3.87 - 5.11 MIL/uL    Hemoglobin 10.8 (*) 12.0 - 15.0 g/dL    HCT 91.4 (*) 78.2 - 46.0 %    MCV 83.3   78.0 - 100.0 fL    MCH 27.0  26.0 - 34.0 pg    MCHC 32.4  30.0 - 36.0 g/dL    RDW 95.6  21.3 - 08.6 %    Platelets 227  150 - 400 K/uL     Imaging / Studies: No results found.  Medications / Allergies: per chart  Antibiotics: Anti-infectives     Start     Dose/Rate Route Frequency Ordered Stop   03/16/12 1945   cefoTEtan (CEFOTAN) 2 g in dextrose 5 % 50 mL IVPB     Comments: Pharmacy may adjust dose strength for optimal dosing      2 g 100 mL/hr over 30 Minutes Intravenous Every 12 hours 03/16/12 1132 03/16/12 2245   03/16/12 0600   cefoTEtan (CEFOTAN) 2 g in dextrose 5 % 50 mL IVPB     Comments: Pharmacy may adjust dose strength for optimal dosing      2 g 100 mL/hr over 30 Minutes Intravenous On call to O.R. 03/15/12 1432 03/16/12 0745

## 2012-03-18 ENCOUNTER — Encounter (HOSPITAL_COMMUNITY): Payer: Self-pay | Admitting: Surgery

## 2012-03-18 MED ORDER — ONDANSETRON HCL 4 MG/2ML IJ SOLN
4.0000 mg | Freq: Four times a day (QID) | INTRAMUSCULAR | Status: DC
  Administered 2012-03-18 – 2012-03-22 (×15): 4 mg via INTRAVENOUS
  Filled 2012-03-18 (×13): qty 2

## 2012-03-18 MED ORDER — LACTATED RINGERS IV BOLUS (SEPSIS)
1000.0000 mL | Freq: Once | INTRAVENOUS | Status: AC
  Administered 2012-03-18: 1000 mL via INTRAVENOUS

## 2012-03-18 MED ORDER — LACTATED RINGERS IV BOLUS (SEPSIS): 1000.0000 mL | Freq: Three times a day (TID) | INTRAVENOUS | Status: AC | PRN

## 2012-03-18 MED ORDER — ONDANSETRON HCL 4 MG/2ML IJ SOLN
INTRAMUSCULAR | Status: AC
  Administered 2012-03-18: 4 mg
  Filled 2012-03-18: qty 2

## 2012-03-18 MED ORDER — METOCLOPRAMIDE HCL 5 MG/ML IJ SOLN
5.0000 mg | Freq: Four times a day (QID) | INTRAMUSCULAR | Status: DC | PRN
  Administered 2012-03-19: 10 mg via INTRAVENOUS
  Filled 2012-03-18: qty 2

## 2012-03-18 NOTE — Progress Notes (Signed)
Robin Miller [id number] 18-May-1990   Subjective:  Sore Nauseated w emesis when she stands up Less sleepy w IV fentanyl  Objective:  Vital signs:  Filed Vitals:   03/17/12 1840 03/17/12 2124 03/18/12 0118 03/18/12 0615  BP: 118/74 118/68 120/70 113/70  Pulse: 100 100 99 120  Temp: 98.8 F (37.1 C) 98.1 F (36.7 C) 98.5 F (36.9 C) 97.9 F (36.6 C)  TempSrc: Oral Oral    Resp: 20 18 18 18   Height:      Weight:      SpO2: 98% 96% 95% 97%       Intake/Output   Yesterday:  11/20 0701 - 11/21 0700 In: 1545 [P.O.:420; I.V.:1125] Out: 550 [Urine:550] This shift:  Total I/O In: 60 [P.O.:60] Out: 250 [Urine:250]  Bowel function:  Flatus: n  BM: n  Physical Exam:  General: Pt awake/alert/oriented x4 in no acute distress.  Tired but not toxic Eyes: PERRL, normal EOM.  Sclera clear.  No icterus Neuro: CN II-XII intact w/o focal sensory/motor deficits. Lymph: No head/neck/groin lymphadenopathy Psych:  No delerium/psychosis/paranoia HENT: Normocephalic, Mucus membranes moist.  No thrush Neck: Supple, No tracheal deviation Chest: No chest wall pain w good excursion CV:  Pulses intact.  Regular rhythm MS: Normal AROM mjr joints.  No obvious deformity Abdomen: Soft.  Nondistended.  Mildly tender at incisions only.  Incisions c/d/i.  No incarcerated hernias. Ext:  SCDs BLE.  No mjr edema.  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 female  2 Days Post-Op  Procedure(s): LAPAROSCOPIC ILEOCECECTOMY  Probable ileus vs intolerance to meds  Plan:  -IVF bolus -sips  -Control nausea aggressively -Anti-ileus protocol -f/u labs -f/u pathology  -Continue non-narcotic pain control -VTE prophylaxis- SCDs, etc -mobilize as tolerated to help  recovery  [name], M.D., F.A.C.S. Gastrointestinal and Minimally Invasive Surgery [facility] [address] [place], [place] [address] [phone] Main / Paging [phone] Voice Mail   03/18/2012  CARE TEAM:  PCP: [name], MD  Outpatient Care Team: Patient Care Team: [name], MD as PCP - General (Internal Medicine) [name], MD as Consulting Physician (General Surgery) [name], MD,FACG as Consulting Physician (Gastroenterology)  Inpatient Treatment Team: Treatment Team: Attending Provider: [name], MD; Respiratory Therapist: Cloyd Stagers, RT   Results:   Labs: Results for orders placed during the hospital encounter of 03/16/12 (from the past 48 hour(s))  CBC     Status: Abnormal   Collection Time   03/16/12 12:30 PM      Component Value Range Comment   WBC 17.2 (*) 4.0 - 10.5 K/uL    RBC 4.12  3.87 - 5.11 MIL/uL    Hemoglobin 11.4 (*) 12.0 - 15.0 g/dL    HCT 95.2 (*) 84.1 - 46.0 %    MCV 84.2  78.0 - 100.0 fL    MCH 27.7  26.0 - 34.0 pg    MCHC 32.9  30.0 - 36.0 g/dL    RDW 32.4  40.1 - 02.7 %    Platelets 276  150 - 400 K/uL   CREATININE, SERUM     Status: Normal   Collection Time   03/16/12 12:30 PM      Component Value Range Comment  Creatinine, Ser 0.51  0.50 - 1.10 mg/dL    GFR calc non Af Amer >90  >90 mL/min    GFR calc Af Amer >90  >90 mL/min   BASIC METABOLIC PANEL     Status: Abnormal   Collection Time   03/17/12  5:40 AM      Component Value Range Comment   Sodium 134 (*) 135 - 145 mEq/L    Potassium 3.2 (*) 3.5 - 5.1 mEq/L    Chloride 101  96 - 112 mEq/L    CO2 21  19 - 32 mEq/L    Glucose, Bld 96  70 - 99 mg/dL    BUN 3 (*) 6 - 23 mg/dL    Creatinine, Ser 1.61  0.50 - 1.10 mg/dL    Calcium 8.8  8.4 - 09.6 mg/dL    GFR calc non Af Amer >90  >90 mL/min    GFR calc Af Amer >90  >90 mL/min   CBC     Status: Abnormal   Collection Time   03/17/12  5:40 AM       Component Value Range Comment   WBC 13.5 (*) 4.0 - 10.5 K/uL    RBC 4.00  3.87 - 5.11 MIL/uL    Hemoglobin 10.8 (*) 12.0 - 15.0 g/dL    HCT 04.5 (*) 40.9 - 46.0 %    MCV 83.3  78.0 - 100.0 fL    MCH 27.0  26.0 - 34.0 pg    MCHC 32.4  30.0 - 36.0 g/dL    RDW 81.1  91.4 - 78.2 %    Platelets 227  150 - 400 K/uL     Imaging / Studies: No results found.  Medications / Allergies: per chart  Antibiotics: Anti-infectives     Start     Dose/Rate Route Frequency Ordered Stop   03/16/12 1945   cefoTEtan (CEFOTAN) 2 g in dextrose 5 % 50 mL IVPB     Comments: Pharmacy may adjust dose strength for optimal dosing      2 g 100 mL/hr over 30 Minutes Intravenous Every 12 hours 03/16/12 1132 03/16/12 2245   03/16/12 0600   cefoTEtan (CEFOTAN) 2 g in dextrose 5 % 50 mL IVPB     Comments: Pharmacy may adjust dose strength for optimal dosing      2 g 100 mL/hr over 30 Minutes Intravenous On call to O.R. 03/15/12 1432 03/16/12 0745

## 2012-03-19 ENCOUNTER — Encounter (HOSPITAL_COMMUNITY): Payer: Self-pay

## 2012-03-19 ENCOUNTER — Encounter (HOSPITAL_COMMUNITY): Payer: Self-pay | Admitting: Surgery

## 2012-03-19 LAB — BASIC METABOLIC PANEL
BUN: <3 mg/dL — ABNORMAL LOW (ref 6–23)
Calcium: 8.8 mg/dL (ref 8.4–10.5)
GFR calc Af Amer: >90 mL/min (ref >90)
GFR calc non Af Amer: >90 mL/min (ref >90)
Potassium: 3.7 mEq/L (ref 3.5–5.1)
Sodium: 134 mEq/L — ABNORMAL LOW (ref 135–145)

## 2012-03-19 MED ORDER — METOCLOPRAMIDE HCL 5 MG/ML IJ SOLN
5.0000 mg | Freq: Four times a day (QID) | INTRAMUSCULAR | Status: AC
  Administered 2012-03-19 – 2012-03-21 (×8): 5 mg via INTRAVENOUS
  Filled 2012-03-19 (×6): qty 1

## 2012-03-19 MED ORDER — FENTANYL CITRATE 0.05 MG/ML IJ SOLN
25.0000 ug | INTRAMUSCULAR | Status: DC | PRN
  Administered 2012-03-19 – 2012-03-22 (×12): 50 ug via INTRAVENOUS
  Filled 2012-03-19 (×13): qty 2

## 2012-03-19 MED ORDER — METOCLOPRAMIDE HCL 5 MG/ML IJ SOLN
5.0000 mg | Freq: Four times a day (QID) | INTRAMUSCULAR | Status: DC | PRN
  Administered 2012-03-22 (×3): 10 mg via INTRAVENOUS
  Filled 2012-03-19 (×3): qty 2

## 2012-03-19 MED ORDER — LACTATED RINGERS IV BOLUS (SEPSIS)
1000.0000 mL | Freq: Once | INTRAVENOUS | Status: AC
  Administered 2012-03-19: 1000 mL via INTRAVENOUS

## 2012-03-19 MED ORDER — KCL IN DEXTROSE-NACL 30-5-0.45 MEQ/L-%-% IV SOLN
INTRAVENOUS | Status: DC
  Administered 2012-03-19 – 2012-03-22 (×8): via INTRAVENOUS
  Filled 2012-03-19 (×13): qty 1000

## 2012-03-19 NOTE — Progress Notes (Signed)
Robin Miller [id number] 02-23-1991   Subjective:  Less sore Nauseated w emesis (spit up) x1 last night Not getting up in hallways Less sleepy w IV fentanyl  Objective:  Vital signs:  Filed Vitals:   03/18/12 1441 03/18/12 1724 03/18/12 2119 03/19/12 0125  BP: 118/76 118/71 120/76 114/71  Pulse: 110 100 100 101  Temp: 98.1 F (36.7 C) 100.4 F (38 C) 98.9 F (37.2 C) 99.6 F (37.6 C)  TempSrc: Oral Oral Oral   Resp: 18 18 19 18   Height:      Weight:      SpO2: 98% 99% 99% 98%       Intake/Output   Yesterday:  11/21 0701 - 11/22 0700 In: 4290.4 [P.O.:180; I.V.:3110.4; IV Piggyback:1000] Out: 2 [Urine:2] This shift:  Total I/O In: 2585 [I.V.:2585] Out: 2 [Urine:2]  Bowel function:  Flatus: Small  BM: n  Physical Exam:  General: Pt awake/alert/oriented x4 in no acute distress.  Tired but not toxic Eyes: PERRL, normal EOM.  Sclera clear.  No icterus Neuro: CN II-XII intact w/o focal sensory/motor deficits. Lymph: No head/neck/groin lymphadenopathy Psych:  No delerium/psychosis/paranoia HENT: Normocephalic, Mucus membranes moist.  No thrush Neck: Supple, No tracheal deviation Chest: No chest wall pain w good excursion CV:  Pulses intact.  Regular rhythm MS: Normal AROM mjr joints.  No obvious deformity Abdomen: Soft.  Nondistended.  Minimally tender at umbilical incision only.  Incisions c/d/i.  No incarcerated hernias. Ext:  SCDs BLE.  No mjr edema.  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 - NEGATIVE FOR DYSPLASIA OR MALIGNANCY. - FIVE BENIGN LYMPH NODES (0/5). - SURGICAL MARGINS ARE NEGATIVE FOR INFLAMMATION. - FIBROUS  OBLITERATION OF APPENDIX.  Assessment  Robin Miller  21 y.o. female  3 Days Post-Op  Procedure(s): LAPAROSCOPIC ILEOCECECTOMY  Probable ileus  Plan:  -IVF bolus - again -sips  -Control nausea more aggressively - add reglan secheduled -NGT if vomits again (not done yesterday) -Anti-ileus protocol -f/u labs -f/u pathology  -Continue non-narcotic pain control.  Pt denies Nausea w fentanyl.  Switch to morphine PRN -VTE prophylaxis- SCDs, etc -mobilize as tolerated to help recovery - get her up  I discussed the patient's status to herself & her RN.  Questions were answered.  They expressed understanding & appreciation.   [name], M.D., F.A.C.S. Gastrointestinal and Minimally Invasive Surgery [facility] [address] [place], [place] [address] [phone] Main / Paging [phone] Voice Mail   03/19/2012  CARE TEAM:  PCP: [name], MD  Outpatient Care Team: Patient Care Team: [name], MD as PCP - General (Internal Medicine) [name], MD as Consulting Physician (General Surgery) [name], MD,FACG as Consulting Physician (Gastroenterology)  Inpatient Treatment Team: Treatment Team: Attending Provider: [name], MD; Respiratory Therapist: Cloyd Stagers, RT; Technician: Frederich Chick, [place]   Results:   Labs: No results found for this or any previous visit (from the past 48 hour(s)).  Imaging / Studies: No results found.  Medications / Allergies: per chart  Antibiotics: Anti-infectives     Start     Dose/Rate Route Frequency Ordered Stop   03/16/12 1945   cefoTEtan (CEFOTAN)  2 g in dextrose 5 % 50 mL IVPB     Comments: Pharmacy may adjust dose strength for optimal dosing      2 g 100 mL/hr over 30 Minutes Intravenous Every 12 hours 03/16/12 1132 03/16/12 2245   03/16/12 0600   cefoTEtan (CEFOTAN) 2 g in dextrose 5 % 50 mL IVPB     Comments: Pharmacy may adjust dose strength for  optimal dosing      2 g 100 mL/hr over 30 Minutes Intravenous On call to O.R. 03/15/12 1432 03/16/12 0745

## 2012-03-20 NOTE — Progress Notes (Signed)
Agree with above Await good bowel fucntion prior to PO Mobilize

## 2012-03-20 NOTE — Progress Notes (Signed)
4 Days Post-Op  Subjective: Still having some nausea, but feeling better. Some flatus this Am. Objective: Vital signs in last 24 hours: Temp:  [98 F (36.7 C)-98.8 F (37.1 C)] 98.8 F (37.1 C) (11/23 0443) Pulse Rate:  [96-103] 103  (11/23 0443) Resp:  [16-18] 17  (11/23 0443) BP: (102-118)/(54-76) 102/65 mmHg (11/23 0443) SpO2:  [96 %-100 %] 96 % (11/23 0443) Last BM Date: 03/15/12  DIET: ICE CHIPS, AFEBRILE, VSS, no labs,  Intake/Output from previous day: 11/22 0701 - 11/23 0700 In: 1374.7 [I.V.:1371.7; IV Piggyback:3] Out: 0  Intake/Output this shift:    General appearance: alert, cooperative and no distress Resp: clear to auscultation bilaterally GI: soft, sore still a bit tender,  not distended, +BS.  Incision looks good  Lab Results:  No results found for this basename: WBC:2,HGB:2,HCT:2,PLT:2 in the last 72 hours  BMET  [facility] 03/19/12 0621  NA 134*  K 3.7  CL 104  CO2 13*  GLUCOSE 86  BUN <3*  CREATININE 0.51  CALCIUM 8.8   PT/INR No results found for this basename: LABPROT:2,INR:2 in the last 72 hours  No results found for this basename: AST:5,ALT:5,ALKPHOS:5,BILITOT:5,PROT:5,ALBUMIN:5 in the last 168 hours   Lipase     Component Value Date/Time   LIPASE 27 09/17/2011 2224     Studies/Results: No results found.  Medications:    . acetaminophen  1,000 mg Oral TID  . alvimopan  12 mg Oral BID  . etonogestrel-ethinyl estradiol  1 each Vaginal Q28 days  . heparin  5,000 Units Subcutaneous Q8H  . [COMPLETED] lactated ringers  1,000 mL Intravenous Once  . lip balm   Topical BID  . metoCLOPramide (REGLAN) injection  5 mg Intravenous Q6H  . ondansetron  4 mg Intravenous Q6H  . [COMPLETED] potassium chloride  40 mEq Oral Daily  . saccharomyces boulardii  250 mg Oral BID    Assessment/Plan Crohn's disease with recurrent ileal inflammation and stricture with obstructive symptoms.   S/P Laparoscopically assisted ileocecectomy. 03/16/2012 Robin Sportsman, MD  History of anemia Depression   Plan:  Clearance Coots of clears as tolerated, continue to mobilize and wait for her to open up.          LOS: 4 days    Robin Miller 03/20/2012

## 2012-03-21 NOTE — Progress Notes (Signed)
5 Days Post-Op  Subjective: Feels better still having some nausea, but had a BM yesterday. Complaining of IV site.  Objective: Vital signs in last 24 hours: Temp:  [97.9 F (36.6 C)-99.7 F (37.6 C)] 99.7 F (37.6 C) (11/24 0609) Pulse Rate:  [95-115] 109  (11/24 0609) Resp:  [16-20] 16  (11/24 0609) BP: (102-108)/(68-72) 107/68 mmHg (11/24 0609) SpO2:  [98 %-100 %] 98 % (11/24 0609) Last BM Date: 03/21/12  60 PO recorded yesterday, Diet: clears, Tm 99.7, VSS, last labs 11/22  Intake/Output from previous day: 11/23 0701 - 11/24 0700 In: 2960 [P.O.:60; I.V.:2900] Out: -  Intake/Output this shift:    General appearance: alert, cooperative and no distress Resp: clear to auscultation bilaterally GI: soft, non-tender; bowel sounds normal; no masses,  no organomegaly and wound ok with some wick present that is dry.   Lab Results:  No results found for this basename: WBC:2,HGB:2,HCT:2,PLT:2 in the last 72 hours  BMET  [facility] 03/19/12 0621  NA 134*  K 3.7  CL 104  CO2 13*  GLUCOSE 86  BUN <3*  CREATININE 0.51  CALCIUM 8.8   PT/INR No results found for this basename: LABPROT:2,INR:2 in the last 72 hours  No results found for this basename: AST:5,ALT:5,ALKPHOS:5,BILITOT:5,PROT:5,ALBUMIN:5 in the last 168 hours   Lipase     Component Value Date/Time   LIPASE 27 09/17/2011 2224     Studies/Results: No results found.  Medications:    . acetaminophen  1,000 mg Oral TID  . alvimopan  12 mg Oral BID  . etonogestrel-ethinyl estradiol  1 each Vaginal Q28 days  . heparin  5,000 Units Subcutaneous Q8H  . lip balm   Topical BID  . metoCLOPramide (REGLAN) injection  5 mg Intravenous Q6H  . ondansetron  4 mg Intravenous Q6H  . saccharomyces boulardii  250 mg Oral BID    Assessment/Plan Crohn's disease with recurrent ileal inflammation and stricture with obstructive symptoms. S/P Laparoscopically assisted ileocecectomy. 03/16/2012 [name], MD  History of  anemia  Depression    Plan;  Clear liquids and ambulate more.       LOS: 5 days    Emanuell Morina 03/21/2012

## 2012-03-22 ENCOUNTER — Inpatient Hospital Stay (HOSPITAL_COMMUNITY): Payer: BC Managed Care – PPO

## 2012-03-22 MED ORDER — OXYCODONE HCL 5 MG PO TABS
5.0000 mg | ORAL_TABLET | ORAL | Status: DC | PRN
  Administered 2012-03-22 – 2012-03-23 (×3): 10 mg via ORAL
  Filled 2012-03-22 (×3): qty 2

## 2012-03-22 MED ORDER — ONDANSETRON HCL 4 MG/2ML IJ SOLN
4.0000 mg | Freq: Four times a day (QID) | INTRAMUSCULAR | Status: DC | PRN
  Administered 2012-03-22 – 2012-03-23 (×3): 4 mg via INTRAVENOUS
  Filled 2012-03-22 (×3): qty 2

## 2012-03-22 MED ORDER — ACETAMINOPHEN 325 MG PO TABS: 325.0000 mg | ORAL_TABLET | Freq: Four times a day (QID) | ORAL | Status: DC | PRN

## 2012-03-22 MED ORDER — BISACODYL 10 MG RE SUPP: 10.0000 mg | Freq: Every day | RECTAL | Status: DC | PRN

## 2012-03-22 MED ORDER — MORPHINE SULFATE 2 MG/ML IJ SOLN: 2.0000 mg | INTRAMUSCULAR | Status: DC | PRN

## 2012-03-22 NOTE — Progress Notes (Signed)
Robin Miller [id number] 09-Nov-1990   Subjective:  Denies much pain Nauseated w emesis (spit up) x1 earlier this AM.  No vertigo/dizziness.  HAd a pretty good weekend Tired of clears Walking in hallways   Objective:  Vital signs:  Filed Vitals:   03/20/12 2108 03/21/12 0609 03/21/12 1437 03/21/12 2112  BP: 108/72 107/68 116/78 112/72  Pulse: 115 109 85 86  Temp: 99.2 F (37.3 C) 99.7 F (37.6 C) 98.2 F (36.8 C) 98.1 F (36.7 C)  TempSrc: Oral Oral Oral Oral  Resp: 18 16 16 18   Height:      Weight:      SpO2: 100% 98% 97% 96%    Last BM Date: 03/21/12  Intake/Output   Yesterday:  11/24 0701 - 11/25 0700 In: 2402.7 [P.O.:80; I.V.:2322.7] Out: -  This shift:  Total I/O In: 1534.3 [I.V.:1534.3] Out: -   Bowel function:  Flatus: Yes   BM: Several yesterday  Physical Exam:  General: Pt awake/alert/oriented x4 in no acute distress.  Alert, smiling.  Not sickly/toxic Eyes: PERRL, normal EOM.  Sclera clear.  No icterus Neuro: CN II-XII intact w/o focal sensory/motor deficits. Lymph: No head/neck/groin lymphadenopathy Psych:  No delerium/psychosis/paranoia HENT: Normocephalic, Mucus membranes moist.  No thrush Neck: Supple, No tracheal deviation Chest: No chest wall pain w good excursion CV:  Pulses intact.  Regular rhythm MS: Normal AROM mjr joints.  No obvious deformity Abdomen: Soft.  Nondistended.  Minimally tender at umbilical incision only.   Incision  Wicks removed.  Incisions c/d/i.  No incarcerated hernias. Ext:  SCDs BLE.  No mjr edema.  No cyanosis Skin: No petechiae / purpurae  *RADIOLOGY REPORT*  Clinical Data: Nausea and vomiting.  ACUTE ABDOMEN SERIES (ABDOMEN 2 VIEW & CHEST 1 VIEW)  Comparison: CT scan dated 09/17/2011 and chest x-ray dated  07/30/2007  Findings: The heart and lungs appear normal. There is free air  under the right hemidiaphragm, probably secondary to the recent  abdominal surgery. Surgical staples are seen in the right  lower  quadrant. There are no dilated loops of large or small bowel.  Small amount of free fluid in the pelvis.  IMPRESSION:  Small amount of residual free air in the abdomen. Small amount of  free fluid in the pelvis. Otherwise, benign-appearing and pelvis.  Original Report Authenticated By: Robin Miller, M.D.   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 - NEGATIVE FOR DYSPLASIA OR MALIGNANCY. - FIVE BENIGN LYMPH NODES (0/5). - SURGICAL MARGINS ARE NEGATIVE FOR INFLAMMATION. - FIBROUS OBLITERATION OF APPENDIX.  Assessment  Robin Miller  21 y.o. Robin  6 Days Post-Op  Procedure(s): LAPAROSCOPIC ILEOCECECTOMY for   Probable chronic nausea - no evid of SBO nor ileus by Xray  Plan:  -try solids -sips  -Follow nausea off scheduled meds -Scopolamine if vomits again -Anti-ileus protocol -f/u labs -Continue non-narcotic pain control.  Switch to morphine PRN.  Add PO narcotic PRN -VTE prophylaxis- SCDs, etc -mobilize as tolerated to help recovery - get her up  I discussed the patient's status to herself & her RN.  Questions were answered.  They expressed understanding & appreciation.   Robin Miller, M.D., F.A.C.S. Gastrointestinal and Minimally Invasive Surgery [facility] [address], [address] [place],  [place] [address] [phone] Main / Paging [phone] Voice Mail   03/22/2012  CARE TEAM:  PCP: Robin Mackintosh, MD  Outpatient Care Team: Patient Care Team: Robin Mackintosh, MD as PCP - General (Internal Medicine) Robin Sportsman, MD as Consulting Physician (General Surgery) Robin Dare, MD,FACG as Consulting Physician (Gastroenterology)  Inpatient  Treatment Team: Treatment Team: Attending Provider: [name], MD; Respiratory Therapist: Cloyd Miller, RT; Registered Nurse: Robin Mantle, RN; Registered Nurse: Robin Pyo, RN; Technician: Robin Miller, NT; Registered Nurse: Robin Coupe, RN   Results:   Labs: No results found for this or any previous visit (from the past 48 hour(s)).  Imaging / Studies: No results found.  Medications / Allergies: per chart  Antibiotics: Anti-infectives     Start     Dose/Rate Route Frequency Ordered Stop   03/16/12 1945   cefoTEtan (CEFOTAN) 2 g in dextrose 5 % 50 mL IVPB     Comments: Pharmacy may adjust dose strength for optimal dosing      2 g 100 mL/hr over 30 Minutes Intravenous Every 12 hours 03/16/12 1132 03/16/12 2245   03/16/12 0600   cefoTEtan (CEFOTAN) 2 g in dextrose 5 % 50 mL IVPB     Comments: Pharmacy may adjust dose strength for optimal dosing      2 g 100 mL/hr over 30 Minutes Intravenous On call to O.R. 03/15/12 1432 03/16/12 0745

## 2012-03-23 MED ORDER — PROMETHAZINE HCL 12.5 MG PO TABS: 12.5000 mg | ORAL_TABLET | Freq: Four times a day (QID) | ORAL | Status: DC | PRN

## 2012-03-23 NOTE — Discharge Summary (Signed)
Physician Discharge Summary  Patient ID: Robin Miller MRN: [id number] DOB/AGE: [birth year] 22 y.o.  Admit date: 03/16/2012 Discharge date: 03/23/2012  Patient Care Team: [name], MD as PCP - General (Internal Medicine) [name], MD as Consulting Physician (General Surgery) [name], MD,FACG as Consulting Physician (Gastroenterology)   Admission Diagnoses: Principal Problem:  *Crohn's disease with fistula & stenosis of ileocecal valve s/p ileocectomy 03/16/2012 Active Problems:  History of anemia  Crohn's ileocolitis  Discharge Diagnoses:  Principal Problem:  *Crohn's disease with fistula & stenosis of ileocecal valve s/p ileocectomy 03/16/2012 Active Problems:  History of anemia  Crohn's ileocolitis   Discharged Condition: good  Hospital Course:   Pt with intermittent n/v & evidence of ileal disease despite medications.  Underwent Dx lap & ileocecal resection for a strictured/fistulized ileocecal valve.  Postoperatively, the patient was placed on an anti-ileus protocol.  The patient mobilized and advanced to a solid diet gradually.  Had intermittent n/v but that gradually improved.  Xrays showed no SBO/ileus.  Pain was well-controlled and transitioned off IV medications.    By the time of discharge, the patient was walking well the hallways, eating food well, having flatus.  Pain was-controlled on an oral regimen.  Based on meeting DC criteria and recovering well, I felt it was safe for the patient to be discharged home with close followup.  Instructions were discussed in detail.  They are written as well.     Consults: None  Significant Diagnostic Studies:   FINAL DIAGNOSIS Diagnosis Small intestine, resection, Ileum and cecum - ULCERATED AND ACTIVE CROHN'S ENTEROCOLITIS WITH STRICTURE AND FISTULA FORMATION. - NEGATIVE FOR DYSPLASIA OR MALIGNANCY. - FIVE BENIGN LYMPH NODES (0/5). - SURGICAL MARGINS ARE NEGATIVE FOR INFLAMMATION. - FIBROUS  OBLITERATION OF APPENDIX. [place] RUND DO Pathologist, Electronic Signature (Case signed 03/18/2012) Specimen Josefina Rynders and Clinical Information Specimen(s) Obtained: Small intestine, resection, Ileum and cecum Specimen Clinical Information Chrohns ileitis with stricture (tl) Daelynn Blower Received in formalin is a segment of intestine which consists of 13 cm of terminal ileum and 5 cm of colon. The margins are patent. The serosa is tan to hyperemic. In the distal ileum there are areas of ulceration with flattening of the normal intestinal folds. The ulceration extends to within 2.5 cm of the proximal margin. At the ileocecal valve there is fibrous thickening of the wall measuring up to 1 cm. There is a stricture at the ileocecal valve with the lumen measuring 0.8 cm. The mucosa lining the colon is glistening tan and there is flattening of the normal intestinal folds. There is an enterocolonic fistula underlying the ileocecal valve. The appendix is present and measures 6.8 cm in length x up to 0.7 cm in diameter. Sections are submitted in seven cassettes. A = proximal margin B = distal margin C = section at enterocolonic fistula 1 of 2 FINAL for COROLYN, HASHBARGER ([id number]) Jalilah Wiltsie(continued) D = section at ileocecal valve E = ileal ulcerations F = lymph nodes G = appendix (GP:caf 03/17/12) Report signed out from the following location(s) [facility] [address] [place], [place], [place] [address]. CLIA #: [id number], 2 of  Treatments: surgery:   PROCEDURE PERFORMED:  Diagnostic laparoscopy  Laparoscopically assisted ileocecectomy.   Discharge Exam: Blood pressure 113/76, pulse 102, temperature 98.2 F (36.8 C), temperature source Oral, resp. rate 14, height 5\' 3"  (1.6 m), weight 135 lb (61.236 kg), last menstrual period 03/01/2012, SpO2 97.00%.  General: Pt awake/alert/oriented x4 in no major acute distress.  Smiling Eyes: PERRL, normal EOM. Sclera nonicteric Neuro: CN II-XII  intact w/o focal sensory/motor deficits. Lymph: No head/neck/groin lymphadenopathy Psych:  No delerium/psychosis/paranoia HENT: Normocephalic, Mucus membranes moist.  No thrush Neck: Supple, No tracheal deviation Chest: No pain.  Good respiratory excursion. CV:  Pulses intact.  Regular rhythm MS: Normal AROM mjr joints.  No obvious deformity Abdomen: Soft, Nondistended.  Min tender periumbilical - incision c/d/i.  No incarcerated hernias. Ext:  SCDs BLE.  No significant edema.  No cyanosis Skin: No petechiae / purpurae   Disposition: 01-Home or Self Care  Discharge Orders    Future Appointments: Provider: Department: Dept Phone: Center:   03/31/2012 10:45 AM [name], MD [facility], [place] [phone] None     Future Orders Please Complete By Expires   Diet - low sodium heart healthy      Increase activity slowly          Medication List     As of 03/23/2012  8:12 AM    TAKE these medications         etonogestrel-ethinyl estradiol 0.12-0.015 MG/24HR vaginal ring   Commonly known as: NUVARING   Place 1 each vaginally every 28 (twenty-eight) days. Insert vaginally and leave in place for 3 consecutive weeks, then remove for 1 week.      oxyCODONE 5 MG immediate release tablet   Commonly known as: Oxy IR/ROXICODONE   Take 1-2 tablets (5-10 mg total) by mouth every 4 (four) hours as needed for pain.      oxyCODONE-acetaminophen 5-325 MG per tablet   Commonly known as: PERCOCET/ROXICET   Take 1 tablet by mouth every 6 (six) hours as needed. For pain      promethazine 12.5 MG tablet   Commonly known as: PHENERGAN   Take 1-2 tablets (12.5-25 mg total) by mouth every 6 (six) hours as needed for nausea.           Follow-up Information    Follow up with Tadashi Burkel C., MD. In 2 weeks.   Contact information:   [address] [place] [address] [phone]          Signed: [name]. 03/23/2012, 8:12 AM

## 2012-03-23 NOTE — Progress Notes (Signed)
Robin Miller [id number] [birth year]   Subjective:  Denies much pain Nauseated w emesis (spit up) x1 earlier this AM.  No vertigo/dizziness.  HAd a pretty good weekend Tired of clears Walking in hallways   Objective:  Vital signs:  Filed Vitals:   03/22/12 0619 03/22/12 1313 03/22/12 2226 03/23/12 0712  BP: 122/74 116/83 116/76 113/76  Pulse: 74 90 93 102  Temp: 98.3 F (36.8 C) 97.5 F (36.4 C) 98.8 F (37.1 C) 98.2 F (36.8 C)  TempSrc: Oral   Oral  Resp: 16 18 14 14   Height:      Weight:      SpO2: 98% 99% 95% 97%    Last BM Date: 03/22/12  Intake/Output   Yesterday:  11/25 0701 - 11/26 0700 In: 2220.3 [P.O.:600; I.V.:1620.3] Out: -  This shift:     Bowel function:  Flatus: Yes   BM: Several yesterday  Physical Exam:  General: Pt awake/alert/oriented x4 in no acute distress.  Alert, smiling.  Not sickly/toxic Eyes: PERRL, normal EOM.  Sclera clear.  No icterus Neuro: CN II-XII intact w/o focal sensory/motor deficits. Lymph: No head/neck/groin lymphadenopathy Psych:  No delerium/psychosis/paranoia HENT: Normocephalic, Mucus membranes moist.  No thrush Neck: Supple, No tracheal deviation Chest: No chest wall pain w good excursion CV:  Pulses intact.  Regular rhythm MS: Normal AROM mjr joints.  No obvious deformity Abdomen: Soft.  Nondistended.  Minimally tender at umbilical incision only.   Incision  Wicks removed.  Incisions c/d/i.  No incarcerated hernias. Ext:  SCDs BLE.  No mjr edema.  No cyanosis Skin: No petechiae / purpurae  *RADIOLOGY REPORT*  Clinical Data: Nausea and vomiting.  ACUTE ABDOMEN SERIES (ABDOMEN 2 VIEW & CHEST 1 VIEW)  Comparison: CT scan dated 09/17/2011 and chest x-ray dated  07/30/2007  Findings: The heart and lungs appear normal. There is free air  under the right hemidiaphragm, probably secondary to the recent  abdominal surgery. Surgical staples are seen in the right lower  quadrant. There are no dilated loops of large  or small bowel.  Small amount of free fluid in the pelvis.  IMPRESSION:  Small amount of residual free air in the abdomen. Small amount of  free fluid in the pelvis. Otherwise, benign-appearing and pelvis.  Original Report Authenticated By: [name], M.D.   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 - NEGATIVE FOR DYSPLASIA OR MALIGNANCY. - FIVE BENIGN LYMPH NODES (0/5). - SURGICAL MARGINS ARE NEGATIVE FOR INFLAMMATION. - FIBROUS OBLITERATION OF APPENDIX.  Assessment  Robin Miller  21 y.o. female  7 Days Post-Op  Procedure(s): LAPAROSCOPIC ILEOCECECTOMY for   Probable chronic nausea - no evid of SBO nor ileus by Xray  Plan:  -try solids -sips  -Follow nausea off scheduled meds -Scopolamine if vomits again -Anti-ileus protocol -f/u labs -Continue non-narcotic pain control.  Switch to morphine PRN.  Add PO narcotic PRN -VTE prophylaxis- SCDs, etc -mobilize as tolerated to help recovery - get her up  I discussed the patient's status to herself & her RN.  Questions were answered.  They expressed understanding & appreciation.   [name], M.D., F.A.C.S. Gastrointestinal and Minimally Invasive Surgery [facility] [address] [place], [place] [address] [phone] Main /  Paging [phone] Voice Mail   03/23/2012  CARE TEAM:  PCP: [name], MD  Outpatient Care Team: Patient Care Team: [name], MD as PCP - General (Internal Medicine) [name], MD as Consulting Physician (General Surgery) [name], MD,FACG as Consulting Physician (Gastroenterology)  Inpatient Treatment Team: Treatment Team: Attending Provider:  [name], MD; Respiratory Therapist: Cloyd Stagers, RT; Registered Nurse: Allene Pyo, RN; Technician: Milus Glazier, NT; Registered Nurse: Augustin Coupe, RN; Technician: Lenoria Farrier, NT; Registered Nurse: Irena Reichmann, RN   Results:   Labs: No results found for this or any previous visit (from the past 48 hour(s)).  Imaging / Studies: Dg Abd Acute W/chest  03/22/2012  *RADIOLOGY REPORT*  Clinical Data: Nausea and vomiting.  ACUTE ABDOMEN SERIES (ABDOMEN 2 VIEW & CHEST 1 VIEW)  Comparison: CT scan dated 09/17/2011 and chest x-ray dated 07/30/2007  Findings: The heart and lungs appear normal. There is free air under the right hemidiaphragm, probably secondary to the recent abdominal surgery.  Surgical staples are seen in the right lower quadrant.  There are no dilated loops of large or small bowel. Small amount of free fluid in the pelvis.  IMPRESSION: Small amount of residual free air in the abdomen. Small amount of free fluid in the pelvis.  Otherwise,  benign-appearing and pelvis.   Original Report Authenticated By: [name], M.D.     Medications / Allergies: per chart  Antibiotics: Anti-infectives     Start     Dose/Rate Route Frequency Ordered Stop   03/16/12 1945   cefoTEtan (CEFOTAN) 2 g in dextrose 5 % 50 mL IVPB     Comments: Pharmacy may adjust dose strength for optimal dosing      2 g 100 mL/hr over 30 Minutes Intravenous Every 12 hours 03/16/12 1132 03/16/12 2245   03/16/12 0600   cefoTEtan (CEFOTAN) 2 g in dextrose 5 % 50 mL IVPB     Comments: Pharmacy may adjust dose strength for optimal dosing      2 g 100 mL/hr over 30 Minutes Intravenous On call to O.R. 03/15/12 1432 03/16/12 0745

## 2012-03-31 ENCOUNTER — Telehealth: Payer: Self-pay

## 2012-03-31 ENCOUNTER — Encounter (INDEPENDENT_AMBULATORY_CARE_PROVIDER_SITE_OTHER): Payer: Self-pay | Admitting: Surgery

## 2012-03-31 ENCOUNTER — Ambulatory Visit (INDEPENDENT_AMBULATORY_CARE_PROVIDER_SITE_OTHER): Payer: BC Managed Care – PPO | Admitting: Surgery

## 2012-03-31 VITALS — BP 112/82 | HR 84 | Temp 98.5°F | Resp 14 | Ht 63.0 in | Wt 126.0 lb

## 2012-03-31 DIAGNOSIS — K5000 Crohn's disease of small intestine without complications: Secondary | ICD-10-CM

## 2012-03-31 DIAGNOSIS — K50013 Crohn's disease of small intestine with fistula: Secondary | ICD-10-CM

## 2012-03-31 DIAGNOSIS — K5080 Crohn's disease of both small and large intestine without complications: Secondary | ICD-10-CM

## 2012-03-31 NOTE — Patient Instructions (Addendum)
Crohn's Disease Crohn's disease is a long-term (chronic) soreness and redness (inflammation) of the intestines (bowel). It can affect any portion of the digestive tract, from the mouth to the anus. It can also cause problems outside the digestive tract. Crohn's disease is closely related to a disease called ulcerative colitis (together, these two diseases are called inflammatory bowel disease).  CAUSES  The cause of Crohn's disease is not known. One theory is that, in an easily affected (susceptible) person, the immune system is triggered to attack the body's own digestive tissue. Crohn's disease runs in families. It seems to be more common in certain geographic areas and amongst certain races. There are no clear-cut dietary causes.  SYMPTOMS  Crohn's disease can cause many different symptoms since it can affect many different parts of the body. Symptoms include:  Fatigue.  Weight loss.  Chronic diarrhea, sometime bloody.  Abdominal pain and cramps.  Fever.  Ulcers or canker sores in the mouth or rectum.  Anemia (low red blood cells).  Arthritis, skin problems, and eye problems may occur. Complications of Crohn's disease can include:  Series of holes (perforation) of the bowel.  Portions of the intestines sticking to each other (adhesions).  Obstruction of the bowel.  Fistula formation, typically in the rectal area but also in other areas. A fistula is an opening between the bowels and the outside, or between the bowels and another organ.  A painful crack in the mucous membrane of the anus (rectal fissure). DIAGNOSIS  Your caregiver may suspect Crohn's disease based on your symptoms and an exam. Blood tests may confirm that there is a problem. You may be asked to submit a stool specimen for examination. X-rays and CT scans may be necessary. Ultimately, the diagnosis is usually made after a procedure that uses a flexible tube that is inserted via your mouth or your anus. This is done  under sedation and is called either an upper endoscopy or colonoscopy. With these tests, the specialist can take tiny tissue samples and remove them from the inside of the bowel (biopsy). Examination of this biopsy tissue under a microscope can reveal Crohn's disease as the cause of your symptoms. Due to the many different forms that Crohn's disease can take, symptoms may be present for several years before a diagnosis is made. HOME CARE INSTRUCTIONS   There is no cure for Crohn's disease. The best treatment is frequent checkups with your caregiver.  Symptoms such as diarrhea can be controlled with medications. Avoid foods that have a laxative effect such as fresh fruit, vegetables and dairy products. During flare ups, you can rest your bowel by refraining from solid foods. Drink clear liquids frequently during the day (electrolyte or re-hydrating fluids are best. Your caregiver can help you with suggestions). Drink often to prevent loss of body fluids (dehydration). When diarrhea has cleared, eat small meals and more frequently. Avoid food additives and stimulants such as caffeine (coffee, tea, or chocolate). Enzyme supplements may help if you develop intolerance to a sugar in dairy products (lactose). Ask your caregiver or dietitian about specific dietary instructions.  Try to maintain a positive attitude. Learn relaxation techniques such as self hypnosis, mental imaging, and muscle relaxation.  If possible, avoid stresses which can aggravate your condition.  Exercise regularly.  Follow your diet.  Always get plenty of rest. SEEK MEDICAL CARE IF:   Your symptoms fail to improve after a week or two of new treatment.  You experience continued weight loss.  You have   ongoing crampy digestion or loose bowels.  You develop a new skin rash, skin sores, or eye problems. SEEK IMMEDIATE MEDICAL CARE IF:   You have worsening of your symptoms or develop new symptoms.  You have a fever.  You  develop bloody diarrhea.  You develop severe abdominal pain. MAKE SURE YOU:   Understand these instructions.  Will watch your condition.  Will get help right away if you are not doing well or get worse. Document Released: 01/22/2005 Document Revised: 07/07/2011 Document Reviewed: 12/21/2006 [facility] Patient Information 2013 [place], [place].  GETTING TO GOOD BOWEL HEALTH. Irregular bowel habits such as constipation and diarrhea can lead to many problems over time.  Having one soft bowel movement a day is the most important way to prevent further problems.  The anorectal canal is designed to handle stretching and feces to safely manage our ability to get rid of solid waste (feces, poop, stool) out of our body.  BUT, hard constipated stools can act like ripping concrete bricks and diarrhea can be a burning fire to this very sensitive area of our body, causing inflamed hemorrhoids, anal fissures, increasing risk is perirectal abscesses, abdominal pain/bloating, an making irritable bowel worse.     The goal: ONE SOFT BOWEL MOVEMENT A DAY!  To have soft, regular bowel movements:    Drink at least 8 tall glasses of water a day.     Take plenty of fiber.  Fiber is the undigested part of plant food that passes into the colon, acting s "natures broom" to encourage bowel motility and movement.  Fiber can absorb and hold large amounts of water. This results in a larger, bulkier stool, which is soft and easier to pass. Work gradually over several weeks up to 6 servings a day of fiber (25g a day even more if needed) in the form of: o Vegetables -- Root (potatoes, carrots, turnips), leafy green (lettuce, salad greens, celery, spinach), or cooked high residue (cabbage, broccoli, etc) o Fruit -- Fresh (unpeeled skin & pulp), Dried (prunes, apricots, cherries, etc ),  or stewed ( applesauce)  o Whole grain breads, pasta, etc (whole wheat)  o Bran cereals    Bulking Agents -- This type of water-retaining fiber  generally is easily obtained each day by one of the following:  o Psyllium bran -- The psyllium plant is remarkable because its ground seeds can retain so much water. This product is available as Metamucil, Konsyl, Effersyllium, Per Diem Fiber, or the less expensive generic preparation in drug and health food stores. Although labeled a laxative, it really is not a laxative.  o Methylcellulose -- This is another fiber derived from wood which also retains water. It is available as Citrucel. o Polyethylene Glycol - and "artificial" fiber commonly called Miralax or Glycolax.  It is helpful for people with gassy or bloated feelings with regular fiber o Flax Seed - a less gassy fiber than psyllium   No reading or other relaxing activity while on the toilet. If bowel movements take longer than 5 minutes, you are too constipated   AVOID CONSTIPATION.  High fiber and water intake usually takes care of this.  Sometimes a laxative is needed to stimulate more frequent bowel movements, but    Laxatives are not a good long-term solution as it can wear the colon out. o Osmotics (Milk of Magnesia, Fleets phosphosoda, Magnesium citrate, MiraLax, GoLytely) are safer than  o Stimulants (Senokot, Castor Oil, Dulcolax, Ex Lax)    o Do not take  laxatives for more than 7days in a row.    IF SEVERELY CONSTIPATED, try a Bowel Retraining Program: o Do not use laxatives.  o Eat a diet high in roughage, such as bran cereals and leafy vegetables.  o Drink six (6) ounces of prune or apricot juice each morning.  o Eat two (2) large servings of stewed fruit each day.  o Take one (1) heaping tablespoon of a psyllium-based bulking agent twice a day. Use sugar-free sweetener when possible to avoid excessive calories.  o Eat a normal breakfast.  o Set aside 15 minutes after breakfast to sit on the toilet, but do not strain to have a bowel movement.  o If you do not have a bowel movement by the third day, use an enema and repeat the  above steps.    Controlling diarrhea o Switch to liquids and simpler foods for a few days to avoid stressing your intestines further. o Avoid dairy products (especially milk & ice cream) for a short time.  The intestines often can lose the ability to digest lactose when stressed. o Avoid foods that cause gassiness or bloating.  Typical foods include beans and other legumes, cabbage, broccoli, and dairy foods.  Every person has some sensitivity to other foods, so listen to our body and avoid those foods that trigger problems for you. o Adding fiber (Citrucel, Metamucil, psyllium, Miralax) gradually can help thicken stools by absorbing excess fluid and retrain the intestines to act more normally.  Slowly increase the dose over a few weeks.  Too much fiber too soon can backfire and cause cramping & bloating. o Probiotics (such as active yogurt, Align, etc) may help repopulate the intestines and colon with normal bacteria and calm down a sensitive digestive tract.  Most studies show it to be of mild help, though, and such products can be costly. o Medicines:   Bismuth subsalicylate (ex. Kayopectate, Pepto Bismol) every 30 minutes for up to 6 doses can help control diarrhea.  Avoid if pregnant.   Loperamide (Immodium) can slow down diarrhea.  Start with two tablets (4mg  total) first and then try one tablet every 6 hours.  Avoid if you are having fevers or severe pain.  If you are not better or start feeling worse, stop all medicines and call your doctor for advice o Call your doctor if you are getting worse or not better.  Sometimes further testing (cultures, endoscopy, X-ray studies, bloodwork, etc) may be needed to help diagnose and treat the cause of the diarrhea. o

## 2012-03-31 NOTE — Progress Notes (Signed)
Subjective:     Patient ID: Robin Miller, female   DOB: March 25, 1991, 21 y.o.   MRN: [id number]  HPI  JEYDI KLINGEL  10-17-1990 [id number]  Patient Care Team: [name], MD as PCP - General (Internal Medicine) [name], MD as Consulting Physician (General Surgery) [name], MD,FACG as Consulting Physician (Gastroenterology)  This patient is a 21 y.o.female who presents today for surgical evaluation s/p lap assisted ileocectomy 03/16/2012.   Diagnosis Small intestine, resection, Ileum and cecum - ULCERATED AND ACTIVE CROHN'S ENTEROCOLITIS WITH STRICTURE AND FISTULA FORMATION. - NEGATIVE FOR DYSPLASIA OR MALIGNANCY. - FIVE BENIGN LYMPH NODES (0/5). - SURGICAL MARGINS ARE NEGATIVE FOR INFLAMMATION. - FIBROUS OBLITERATION OF APPENDIX. [place] RUND DO Pathologist, Electronic Signature (Case signed 03/18/2012)  The patient comes in today feeling better.  Appetite still fair.  Only tolerates a few bites every meal.  Drinking okay.  No lightheadedness or dizziness.  Last a few pounds but actually likes that.  Taking final exams this week.  No nausea or vomiting anymore.  Energy level better.  Having two loose bowel movements a day.  No fevers or chills.  All pain medicines.  In good spirits.  She comes today by herself.  Patient Active Problem List  Diagnosis  . History of anemia  . Medication side effect  . 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 Depression   . Vasovagal syncope   . Crohn's ileitis   . Anemia   . Headache     last one 03/08/12  . Nausea vomiting and diarrhea   . Abdominal pain   . Cancer     basal cell back and face  . 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 Tonsillectomy and adenoidectomy   . Skin cancer excision   .  Ileocetomy 03/16/2012  . Laparoscopic ileocecectomy 03/16/2012    Procedure: LAPAROSCOPIC ILEOCECECTOMY;  Surgeon: [name], MD;  Location: [facility] OR;  Service: General;  Laterality: N/A;  lap assisted ileocectomy    History   Social History  . Marital Status: Single    Spouse Name: N/A    Number of Children: 0  . Years of Education: N/A   Occupational History  . student    Social History Main Topics  . Smoking status: Never Smoker   . Smokeless tobacco: Never Used  . Alcohol Use: No  . Drug Use: No  . Sexually Active: Yes    Birth Control/ Protection: Inserts   Other Topics Concern  . Not on file   Social History Narrative  . No narrative on file    Family History  Problem Relation Age of Onset  . Stroke Mother   . Breast cancer Mother   . Cancer Mother     breast  . Breast cancer Paternal Aunt   . Crohn's disease Father   . Colitis Maternal Aunt   . Colon cancer Paternal Grandmother     ? cancer, part of colon removed    Current Outpatient Prescriptions  Medication Sig Dispense Refill  . etonogestrel-ethinyl estradiol (NUVARING) 0.12-0.015 MG/24HR vaginal ring Place 1 each vaginally every 28 (twenty-eight) days. Insert vaginally and leave in place for 3 consecutive weeks, then remove for 1 week.       Marland Kitchen oxyCODONE (  OXY IR/ROXICODONE) 5 MG immediate release tablet Take 1-2 tablets (5-10 mg total) by mouth every 4 (four) hours as needed for pain.  40 tablet  0  . promethazine (PHENERGAN) 12.5 MG tablet Take 1-2 tablets (12.5-25 mg total) by mouth every 6 (six) hours as needed for nausea.  20 tablet  3     Allergies  Allergen Reactions  . Prednisone     Blurred vision, ringing in ears, insomnia, shaky, body convulsions,nausea    BP 112/82  Pulse 84  Temp 98.5 F (36.9 C) (Temporal)  Resp 14  Ht 5\' 3"  (1.6 m)  Wt 126 lb (57.153 kg)  BMI 22.32 kg/m2  LMP 03/03/2012  Dg Abd Acute W/chest  03/22/2012  *RADIOLOGY REPORT*  Clinical Data: Nausea and  vomiting.  ACUTE ABDOMEN SERIES (ABDOMEN 2 VIEW & CHEST 1 VIEW)  Comparison: CT scan dated 09/17/2011 and chest x-ray dated 07/30/2007  Findings: The heart and lungs appear normal. There is free air under the right hemidiaphragm, probably secondary to the recent abdominal surgery.  Surgical staples are seen in the right lower quadrant.  There are no dilated loops of large or small bowel. Small amount of free fluid in the pelvis.  IMPRESSION: Small amount of residual free air in the abdomen. Small amount of free fluid in the pelvis.  Otherwise,  benign-appearing and pelvis.   Original Report Authenticated By: [name], M.D.      Review of Systems  Constitutional: Negative for fever, chills and diaphoresis.  HENT: Negative for ear pain, sore throat and trouble swallowing.   Eyes: Negative for photophobia and visual disturbance.  Respiratory: Negative for cough and choking.   Cardiovascular: Negative for chest pain and palpitations.  Gastrointestinal: Negative for nausea, vomiting, abdominal pain, constipation, blood in stool, abdominal distention, anal bleeding and rectal pain.  Genitourinary: Negative for dysuria, frequency and difficulty urinating.  Musculoskeletal: Negative for myalgias and gait problem.  Skin: Negative for color change, pallor and rash.  Neurological: Negative for dizziness, speech difficulty, weakness and numbness.  Hematological: Negative for adenopathy.  Psychiatric/Behavioral: Negative for confusion and agitation. The patient is not nervous/anxious.        Objective:   Physical Exam  Constitutional: She is oriented to person, place, and time. She appears well-developed and well-nourished. No distress.  HENT:  Head: Normocephalic.  Mouth/Throat: Oropharynx is clear and moist. No oropharyngeal exudate.  Eyes: Conjunctivae normal and EOM are normal. Pupils are equal, round, and reactive to light. No scleral icterus.  Neck: Normal range of motion. No tracheal  deviation present.  Cardiovascular: Normal rate and intact distal pulses.   Pulmonary/Chest: Effort normal. No respiratory distress. She exhibits no tenderness.  Abdominal: Soft. She exhibits no distension and no abdominal bruit. There is no tenderness. There is no CVA tenderness. No hernia. Hernia confirmed negative in the right inguinal area and confirmed negative in the left inguinal area.         Incisions clean with normal healing ridges.  No hernias  Genitourinary: No vaginal discharge found.  Musculoskeletal: Normal range of motion. She exhibits no tenderness.  Lymphadenopathy:       Right: No inguinal adenopathy present.       Left: No inguinal adenopathy present.  Neurological: She is alert and oriented to person, place, and time. No cranial nerve deficit. She exhibits normal muscle tone. Coordination normal.  Skin: Skin is warm and dry. No rash noted. She is not diaphoretic.  Psychiatric: She has a normal mood and  affect. Her behavior is normal. Her mood appears not anxious. She does not exhibit a depressed mood.       Smiling, a little giggly       Assessment:     Improving s/p ileocecal resection for stricture/fistula at IC valve.    Plan:     Increase activity as tolerated to regular activity.  Do not push through pain.  Diet as tolerated. Bowel regimen to avoid problems.  Diarrhea/loose stools is very common after ileal resection especially in the setting of a stricture.  Gradually improves over the next few months.  Followup with gastroenterology to monitor, treat Crohn's disease.  Appt in a few months  Return to clinic 1 month to make sure that she continues to improve.   Instructions discussed.  Followup with primary care physician for other health issues as would normally be done.  Questions answered.  The patient expressed understanding and appreciation

## 2012-03-31 NOTE — Telephone Encounter (Signed)
Patient advised that she needs to come for lab work this week or next.  She is also advised to make sure she keeps her office visit with [name] for 05/01/11

## 2012-04-05 ENCOUNTER — Other Ambulatory Visit: Payer: BC Managed Care – PPO

## 2012-04-05 DIAGNOSIS — K5080 Crohn's disease of both small and large intestine without complications: Secondary | ICD-10-CM

## 2012-04-13 LAB — THIOPURINE METHYLTRANSFERASE (TPMT), RBC

## 2012-04-30 ENCOUNTER — Other Ambulatory Visit (INDEPENDENT_AMBULATORY_CARE_PROVIDER_SITE_OTHER): Payer: BC Managed Care – PPO

## 2012-04-30 ENCOUNTER — Encounter: Payer: Self-pay | Admitting: Gastroenterology

## 2012-04-30 ENCOUNTER — Ambulatory Visit (INDEPENDENT_AMBULATORY_CARE_PROVIDER_SITE_OTHER): Payer: BC Managed Care – PPO | Admitting: Gastroenterology

## 2012-04-30 VITALS — BP 102/64 | HR 80 | Ht 64.5 in | Wt 123.0 lb

## 2012-04-30 DIAGNOSIS — K5080 Crohn's disease of both small and large intestine without complications: Secondary | ICD-10-CM

## 2012-04-30 LAB — LIPASE: Lipase: 29 U/L (ref 11.0–59.0)

## 2012-04-30 LAB — COMPREHENSIVE METABOLIC PANEL
Albumin: 4.1 g/dL (ref 3.5–5.2)
Alkaline Phosphatase: 64 U/L (ref 39–117)
BUN: 6 mg/dL (ref 6–23)
CO2: 24 mEq/L (ref 19–32)
GFR: 114.52 mL/min (ref >60.00)
Glucose, Bld: 89 mg/dL (ref 70–99)
Potassium: 4.2 mEq/L (ref 3.5–5.1)
Sodium: 140 mEq/L (ref 135–145)
Total Bilirubin: 0.7 mg/dL (ref 0.3–1.2)
Total Protein: 8.4 g/dL — ABNORMAL HIGH (ref 6.0–8.3)

## 2012-04-30 LAB — CBC WITH DIFFERENTIAL/PLATELET
Basophils Relative: 0.8 % (ref 0.0–3.0)
Eosinophils Relative: 2.1 % (ref 0.0–5.0)
HCT: 39.3 % (ref 36.0–46.0)
Hemoglobin: 13.2 g/dL (ref 12.0–15.0)
Lymphs Abs: 1.3 10*3/uL (ref 0.7–4.0)
MCV: 83.6 fl (ref 78.0–100.0)
Monocytes Absolute: 0.3 10*3/uL (ref 0.1–1.0)
Monocytes Relative: 5.9 % (ref 3.0–12.0)
RBC: 4.7 Mil/uL (ref 3.87–5.11)
WBC: 4.6 10*3/uL (ref 4.5–10.5)

## 2012-04-30 MED ORDER — MERCAPTOPURINE 50 MG PO TABS: 50.0000 mg | ORAL_TABLET | Freq: Every day | ORAL | Status: DC

## 2012-04-30 NOTE — Patient Instructions (Addendum)
Your physician has requested that you go to the basement for the following lab work before leaving today: CBC, Cmet, Lipase.  We have sent the following medications to your pharmacy for you to pick up at your convenience: Mercaptopurine.   Please come back to our lab in the basement level for repeat blood work on 05/13/12 or 05/14/12 and time between 7:30am-5:30pm.

## 2012-04-30 NOTE — Progress Notes (Signed)
History of Present Illness: This is a 22 year old female status post ileocecal resection for Crohn's enterocolitis. See pathology report and TPMT activity report below. She has made very good recovery from surgery in November. Her appetite is good, her weight is stabilizing. She has 3 formed bowel movements each day. She has mild lower abdominal pain and slight bleeding at the wound site. She has followup appointment with Dr. Michaell Cowing on Monday.  TPMT ACTIVITY 10.7 Units/mL RBC Comments: Reference Range: Normal: 15.1 - 26.4 Heterozygous for low TPMT variant: 6.3 - 15.0 Homozygous for low TPMT variant: <6.3  Small intestine, resection, Ileum and cecum - ULCERATED AND ACTIVE CROHN'S ENTEROCOLITIS WITH STRICTURE AND FISTULA FORMATION. - NEGATIVE FOR DYSPLASIA OR MALIGNANCY. - FIVE BENIGN LYMPH NODES (0/5). - SURGICAL MARGINS ARE NEGATIVE FOR INFLAMMATION. - FIBROUS OBLITERATION OF APPENDIX.   Current Medications, Allergies, Past Medical History, Past Surgical History, Family History and Social History were reviewed in [employer] record.  Physical Exam: General: Well developed , well nourished, no acute distress Head: Normocephalic and atraumatic Eyes:  sclerae anicteric, EOMI Ears: Normal auditory acuity Mouth: No deformity or lesions Lungs: Clear throughout to auscultation Heart: Regular rate and rhythm; no murmurs, rubs or bruits Abdomen: Soft, mild lower bowel tenderness and non distended. Wound is covered and was not examined. No masses, hepatosplenomegaly or hernias noted. Normal Bowel sounds Musculoskeletal: Symmetrical with no gross deformities  Pulses:  Normal pulses noted Extremities: No clubbing, cyanosis, edema or deformities noted Neurological: Alert oriented x 4, grossly nonfocal Psychological:  Alert and cooperative. Normal mood and affect  Assessment and Recommendations:  1. Crohn's ileocolitis status post ileocolonic resection. We had a long  discussion about disease prevention strategies and the risk of recurrence after surgery. The risks, benefits and alternatives to 6 MP therapy were discussed with the patient she agrees to proceed. Begin 6-MP 50 mg daily. Blood work today, at 2 weeks and 4 weeks. Return office visit in 4 weeks.

## 2012-05-03 ENCOUNTER — Ambulatory Visit (INDEPENDENT_AMBULATORY_CARE_PROVIDER_SITE_OTHER): Payer: BC Managed Care – PPO | Admitting: Surgery

## 2012-05-03 ENCOUNTER — Encounter (INDEPENDENT_AMBULATORY_CARE_PROVIDER_SITE_OTHER): Payer: Self-pay | Admitting: Surgery

## 2012-05-03 VITALS — BP 100/58 | HR 64 | Temp 97.6°F | Resp 12 | Ht 63.0 in | Wt 122.2 lb

## 2012-05-03 DIAGNOSIS — K5000 Crohn's disease of small intestine without complications: Secondary | ICD-10-CM

## 2012-05-03 DIAGNOSIS — K50013 Crohn's disease of small intestine with fistula: Secondary | ICD-10-CM

## 2012-05-03 NOTE — Progress Notes (Signed)
Subjective:     Patient ID: Robin Miller, female   DOB: 1991/04/10, 22 y.o.   MRN: [id number]  HPI   Robin Miller  1991-01-07 [id number]  Patient Care Team: [name], MD as PCP - General (Internal Medicine) [name], MD as Consulting Physician (General Surgery) [name], MD,FACG as Consulting Physician (Gastroenterology)  This patient is a 22 y.o.female who presents today for surgical evaluation s/p lap assisted ileocectomy 03/16/2012.   POSTOPERATIVE DIAGNOSIS:  Crohn's disease with stricture at the  ileocecal valve and intermittent obstructive symptoms.   PROCEDURE PERFORMED:  Diagnostic laparoscopy  Laparoscopically assisted ileocecectomy.   Diagnosis Small intestine, resection, Ileum and cecum - ULCERATED AND ACTIVE CROHN'S ENTEROCOLITIS WITH STRICTURE AND FISTULA FORMATION. - NEGATIVE FOR DYSPLASIA OR MALIGNANCY. - FIVE BENIGN LYMPH NODES (0/5). - SURGICAL MARGINS ARE NEGATIVE FOR INFLAMMATION. - FIBROUS OBLITERATION OF APPENDIX.  The patient comes in today feeling better.  Appetite better but not back to normal yet.  No lightheadedness or dizziness.  No nausea or vomiting anymore.  Energy level better.  Having two loose bowel movements a day.  No fevers or chills.  All pain medicines.  In good spirits.  She comes today by herself.  She saw her gastroenterologist, [name].  She has started 6-MP immunosuppression  Patient Active Problem List  Diagnosis  . History of anemia  . Medication side effect  . 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 Depression   . Vasovagal syncope   . Anemia   . Headache     last one 03/08/12  . Nausea vomiting and diarrhea   . Abdominal pain   . Cancer     basal cell back and face  . 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 Tonsillectomy and adenoidectomy   . Skin cancer excision   . Ileocetomy 03/16/2012  . Laparoscopic ileocecectomy 03/16/2012    Procedure: LAPAROSCOPIC ILEOCECECTOMY;  Surgeon: [name], MD;  Location: [facility] OR;  Service: General;  Laterality: N/A;  lap assisted ileocectomy    History   Social History  . Marital Status: Single    Spouse Name: N/A    Number of Children: 0  . Years of Education: N/A   Occupational History  . student    Social History Main Topics  . Smoking status: Never Smoker   . Smokeless tobacco: Never Used  . Alcohol Use: No  . Drug Use: No  . Sexually Active: Yes    Birth Control/ Protection: Inserts   Other Topics Concern  . Not on file   Social History Narrative  . No narrative on file    Family History  Problem Relation Age of Onset  . Stroke Mother   . Breast cancer Mother   . Cancer Mother     breast  . Breast cancer Paternal Aunt   . Crohn's disease Father   . Colitis Maternal Aunt   . Colon cancer Paternal Grandmother     ? cancer, part of colon removed    Current Outpatient Prescriptions  Medication Sig Dispense Refill  . etonogestrel-ethinyl estradiol (NUVARING) 0.12-0.015 MG/24HR vaginal ring Place 1 each vaginally every 28 (twenty-eight) days. Insert vaginally and leave in place for 3 consecutive weeks, then remove for 1 week.       Marland Kitchen  mercaptopurine (PURINETHOL) 50 MG tablet Take 1 tablet (50 mg total) by mouth daily. Give on an empty stomach 1 hour before or 2 hours after meals. Caution: Chemotherapy.  30 tablet  2  . promethazine (PHENERGAN) 12.5 MG tablet Take 1-2 tablets (12.5-25 mg total) by mouth every 6 (six) hours as needed for nausea.  20 tablet  3     Allergies  Allergen Reactions  . Prednisone     Blurred vision, ringing in ears, insomnia, shaky, body convulsions,nausea    BP 100/58  Pulse 64  Temp 97.6 F (36.4 C) (Temporal)  Resp 12  Ht 5\' 3"  (1.6 m)  Wt 122 lb 3.2 oz  (55.43 kg)  BMI 21.65 kg/m2  LMP 04/30/2012  Dg Abd Acute W/chest  03/22/2012  *RADIOLOGY REPORT*  Clinical Data: Nausea and vomiting.  ACUTE ABDOMEN SERIES (ABDOMEN 2 VIEW & CHEST 1 VIEW)  Comparison: CT scan dated 09/17/2011 and chest x-ray dated 07/30/2007  Findings: The heart and lungs appear normal. There is free air under the right hemidiaphragm, probably secondary to the recent abdominal surgery.  Surgical staples are seen in the right lower quadrant.  There are no dilated loops of large or small bowel. Small amount of free fluid in the pelvis.  IMPRESSION: Small amount of residual free air in the abdomen. Small amount of free fluid in the pelvis.  Otherwise,  benign-appearing and pelvis.   Original Report Authenticated By: [name], M.D.      Review of Systems  Constitutional: Negative for fever, chills and diaphoresis.  HENT: Negative for ear pain, sore throat and trouble swallowing.   Eyes: Negative for photophobia and visual disturbance.  Respiratory: Negative for cough and choking.   Cardiovascular: Negative for chest pain and palpitations.  Gastrointestinal: Negative for nausea, vomiting, abdominal pain, constipation, blood in stool, abdominal distention, anal bleeding and rectal pain.  Genitourinary: Negative for dysuria, frequency and difficulty urinating.  Musculoskeletal: Negative for myalgias and gait problem.  Skin: Negative for color change, pallor and rash.  Neurological: Negative for dizziness, speech difficulty, weakness and numbness.  Hematological: Negative for adenopathy.  Psychiatric/Behavioral: Negative for confusion and agitation. The patient is not nervous/anxious.        Objective:   Physical Exam  Constitutional: She is oriented to person, place, and time. She appears Miller-developed and Miller-nourished. No distress.       Moving around without difficulty.  HENT:  Head: Normocephalic.  Mouth/Throat: Oropharynx is clear and moist. No oropharyngeal  exudate.  Eyes: Conjunctivae normal and EOM are normal. Pupils are equal, round, and reactive to light. No scleral icterus.  Neck: Normal range of motion. No tracheal deviation present.  Cardiovascular: Normal rate and intact distal pulses.   Pulmonary/Chest: Effort normal. No respiratory distress. She exhibits no tenderness.  Abdominal: Soft. She exhibits no distension and no abdominal bruit. There is no tenderness. There is no CVA tenderness. No hernia. Hernia confirmed negative in the right inguinal area and confirmed negative in the left inguinal area.         Incisions clean with normal healing ridges.  No hernias  Genitourinary: No vaginal discharge found.  Musculoskeletal: Normal range of motion. She exhibits no tenderness.  Lymphadenopathy:       Right: No inguinal adenopathy present.       Left: No inguinal adenopathy present.  Neurological: She is alert and oriented to person, place, and time. No cranial nerve deficit. She exhibits normal muscle tone. Coordination normal.  Skin: Skin is  warm and dry. No rash noted. She is not diaphoretic.  Psychiatric: She has a normal mood and affect. Her behavior is normal. Her mood appears not anxious. She does not exhibit a depressed mood.       Smiling       Assessment:     Improving s/p ileocecal resection for stricture/fistula at IC valve.  I am glad that she is recovering so Miller so far.    Plan:     Increase activity as tolerated to regular activity.  Do not push through pain.  Diet as tolerated. Bowel regimen to avoid problems.  Diarrhea/loose stools is very common after ileal resection especially in the setting of a stricture.  Gradually improves over the next few months.  Followup with gastroenterology to monitor, treat Crohn's disease.  Appt in a few months  Return to clinic PRN.   Instructions discussed.  Followup with primary care physician for other health issues as would normally be done.  Questions answered.  The patient  expressed understanding and appreciation

## 2012-05-03 NOTE — Patient Instructions (Addendum)
Crohn's Disease Crohn's disease is a long-term (chronic) soreness and redness (inflammation) of the intestines (bowel). It can affect any portion of the digestive tract, from the mouth to the anus. It can also cause problems outside the digestive tract. Crohn's disease is closely related to a disease called ulcerative colitis (together, these two diseases are called inflammatory bowel disease).  CAUSES  The cause of Crohn's disease is not known. One theory is that, in an easily affected (susceptible) person, the immune system is triggered to attack the body's own digestive tissue. Crohn's disease runs in families. It seems to be more common in certain geographic areas and amongst certain races. There are no clear-cut dietary causes.  SYMPTOMS  Crohn's disease can cause many different symptoms since it can affect many different parts of the body. Symptoms include:  Fatigue.  Weight loss.  Chronic diarrhea, sometime bloody.  Abdominal pain and cramps.  Fever.  Ulcers or canker sores in the mouth or rectum.  Anemia (low red blood cells).  Arthritis, skin problems, and eye problems may occur. Complications of Crohn's disease can include:  Series of holes (perforation) of the bowel.  Portions of the intestines sticking to each other (adhesions).  Obstruction of the bowel.  Fistula formation, typically in the rectal area but also in other areas. A fistula is an opening between the bowels and the outside, or between the bowels and another organ.  A painful crack in the mucous membrane of the anus (rectal fissure). DIAGNOSIS  Your caregiver may suspect Crohn's disease based on your symptoms and an exam. Blood tests may confirm that there is a problem. You may be asked to submit a stool specimen for examination. X-rays and CT scans may be necessary. Ultimately, the diagnosis is usually made after a procedure that uses a flexible tube that is inserted via your mouth or your anus. This is done  under sedation and is called either an upper endoscopy or colonoscopy. With these tests, the specialist can take tiny tissue samples and remove them from the inside of the bowel (biopsy). Examination of this biopsy tissue under a microscope can reveal Crohn's disease as the cause of your symptoms. Due to the many different forms that Crohn's disease can take, symptoms may be present for several years before a diagnosis is made. HOME CARE INSTRUCTIONS   There is no cure for Crohn's disease. The best treatment is frequent checkups with your caregiver.  Symptoms such as diarrhea can be controlled with medications. Avoid foods that have a laxative effect such as fresh fruit, vegetables and dairy products. During flare ups, you can rest your bowel by refraining from solid foods. Drink clear liquids frequently during the day (electrolyte or re-hydrating fluids are best. Your caregiver can help you with suggestions). Drink often to prevent loss of body fluids (dehydration). When diarrhea has cleared, eat small meals and more frequently. Avoid food additives and stimulants such as caffeine (coffee, tea, or chocolate). Enzyme supplements may help if you develop intolerance to a sugar in dairy products (lactose). Ask your caregiver or dietitian about specific dietary instructions.  Try to maintain a positive attitude. Learn relaxation techniques such as self hypnosis, mental imaging, and muscle relaxation.  If possible, avoid stresses which can aggravate your condition.  Exercise regularly.  Follow your diet.  Always get plenty of rest. SEEK MEDICAL CARE IF:   Your symptoms fail to improve after a week or two of new treatment.  You experience continued weight loss.  You have   ongoing crampy digestion or loose bowels.  You develop a new skin rash, skin sores, or eye problems. SEEK IMMEDIATE MEDICAL CARE IF:   You have worsening of your symptoms or develop new symptoms.  You have a fever.  You  develop bloody diarrhea.  You develop severe abdominal pain. MAKE SURE YOU:   Understand these instructions.  Will watch your condition.  Will get help right away if you are not doing well or get worse. Document Released: 01/22/2005 Document Revised: 07/07/2011 Document Reviewed: 12/21/2006 [employer] Patient Information 2013 [employer].  GETTING TO GOOD BOWEL HEALTH. Irregular bowel habits such as constipation and diarrhea can lead to many problems over time.  Having one soft bowel movement a day is the most important way to prevent further problems.  The anorectal canal is designed to handle stretching and feces to safely manage our ability to get rid of solid waste (feces, poop, stool) out of our body.  BUT, hard constipated stools can act like ripping concrete bricks and diarrhea can be a burning fire to this very sensitive area of our body, causing inflamed hemorrhoids, anal fissures, increasing risk is perirectal abscesses, abdominal pain/bloating, an making irritable bowel worse.     The goal: ONE SOFT BOWEL MOVEMENT A DAY!  To have soft, regular bowel movements:    Drink at least 8 tall glasses of water a day.     Take plenty of fiber.  Fiber is the undigested part of plant food that passes into the colon, acting s "natures broom" to encourage bowel motility and movement.  Fiber can absorb and hold large amounts of water. This results in a larger, bulkier stool, which is soft and easier to pass. Work gradually over several weeks up to 6 servings a day of fiber (25g a day even more if needed) in the form of: o Vegetables -- Root (potatoes, carrots, turnips), leafy green (lettuce, salad greens, celery, spinach), or cooked high residue (cabbage, broccoli, etc) o Fruit -- Fresh (unpeeled skin & pulp), Dried (prunes, apricots, cherries, etc ),  or stewed ( applesauce)  o Whole grain breads, pasta, etc (whole wheat)  o Bran cereals    Bulking Agents -- This type of water-retaining fiber  generally is easily obtained each day by one of the following:  o Psyllium bran -- The psyllium plant is remarkable because its ground seeds can retain so much water. This product is available as Metamucil, Konsyl, Effersyllium, Per Diem Fiber, or the less expensive generic preparation in drug and health food stores. Although labeled a laxative, it really is not a laxative.  o Methylcellulose -- This is another fiber derived from wood which also retains water. It is available as Citrucel. o Polyethylene Glycol - and "artificial" fiber commonly called Miralax or Glycolax.  It is helpful for people with gassy or bloated feelings with regular fiber o Flax Seed - a less gassy fiber than psyllium   No reading or other relaxing activity while on the toilet. If bowel movements take longer than 5 minutes, you are too constipated   AVOID CONSTIPATION.  High fiber and water intake usually takes care of this.  Sometimes a laxative is needed to stimulate more frequent bowel movements, but    Laxatives are not a good long-term solution as it can wear the colon out. o Osmotics (Milk of Magnesia, Fleets phosphosoda, Magnesium citrate, MiraLax, GoLytely) are safer than  o Stimulants (Senokot, Castor Oil, Dulcolax, Ex Lax)    o Do not take   laxatives for more than 7days in a row.    IF SEVERELY CONSTIPATED, try a Bowel Retraining Program: o Do not use laxatives.  o Eat a diet high in roughage, such as bran cereals and leafy vegetables.  o Drink six (6) ounces of prune or apricot juice each morning.  o Eat two (2) large servings of stewed fruit each day.  o Take one (1) heaping tablespoon of a psyllium-based bulking agent twice a day. Use sugar-free sweetener when possible to avoid excessive calories.  o Eat a normal breakfast.  o Set aside 15 minutes after breakfast to sit on the toilet, but do not strain to have a bowel movement.  o If you do not have a bowel movement by the third day, use an enema and repeat the  above steps.    Controlling diarrhea o Switch to liquids and simpler foods for a few days to avoid stressing your intestines further. o Avoid dairy products (especially milk & ice cream) for a short time.  The intestines often can lose the ability to digest lactose when stressed. o Avoid foods that cause gassiness or bloating.  Typical foods include beans and other legumes, cabbage, broccoli, and dairy foods.  Every person has some sensitivity to other foods, so listen to our body and avoid those foods that trigger problems for you. o Adding fiber (Citrucel, Metamucil, psyllium, Miralax) gradually can help thicken stools by absorbing excess fluid and retrain the intestines to act more normally.  Slowly increase the dose over a few weeks.  Too much fiber too soon can backfire and cause cramping & bloating. o Probiotics (such as active yogurt, Align, etc) may help repopulate the intestines and colon with normal bacteria and calm down a sensitive digestive tract.  Most studies show it to be of mild help, though, and such products can be costly. o Medicines:   Bismuth subsalicylate (ex. Kayopectate, Pepto Bismol) every 30 minutes for up to 6 doses can help control diarrhea.  Avoid if pregnant.   Loperamide (Immodium) can slow down diarrhea.  Start with two tablets (4mg total) first and then try one tablet every 6 hours.  Avoid if you are having fevers or severe pain.  If you are not better or start feeling worse, stop all medicines and call your doctor for advice o Call your doctor if you are getting worse or not better.  Sometimes further testing (cultures, endoscopy, X-ray studies, bloodwork, etc) may be needed to help diagnose and treat the cause of the diarrhea. o  

## 2012-05-03 NOTE — Progress Notes (Deleted)
Subjective:     Patient ID: Robin Miller, female   DOB: [birth year], 22 y.o.   MRN: [id number]  HPI  TYLIN STRADLEY  1990/10/20 [id number]  Patient Care Team: [name], MD as PCP - General (Internal Medicine) [name], MD as Consulting Physician (General Surgery) [name], MD,FACG as Consulting Physician (Gastroenterology)  This patient is a 22 y.o.female who presents today for surgical evaluation:   POSTOPERATIVE DIAGNOSIS:  Crohn's disease with stricture at the  ileocecal valve and intermittent obstructive symptoms.   PROCEDURE PERFORMED:  Diagnostic laparoscopy  Laparoscopically assisted ileocecectomy.   Diagnosis Small intestine, resection, Ileum and cecum - ULCERATED AND ACTIVE CROHN'S ENTEROCOLITIS WITH STRICTURE AND FISTULA FORMATION. - NEGATIVE FOR DYSPLASIA OR MALIGNANCY. - FIVE BENIGN LYMPH NODES (0/5). - SURGICAL MARGINS ARE NEGATIVE FOR INFLAMMATION. - FIBROUS OBLITERATION OF APPENDIX. IAGNOSIS   Patient Active Problem List  Diagnosis  . History of anemia  . Medication side effect  . 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 Depression   . Vasovagal syncope   . Anemia   . Headache     last one 03/08/12  . Nausea vomiting and diarrhea   . Abdominal pain   . Cancer     basal cell back and face  . 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 Tonsillectomy and adenoidectomy   . Skin cancer excision   . Ileocetomy 03/16/2012  . Laparoscopic ileocecectomy 03/16/2012    Procedure: LAPAROSCOPIC ILEOCECECTOMY;  Surgeon: [name], MD;  Location: [facility] OR;  Service: General;  Laterality: N/A;  lap assisted ileocectomy    History   Social History  . Marital Status: Single    Spouse Name: N/A    Number of Children: 0    . Years of Education: N/A   Occupational History  . student    Social History Main Topics  . Smoking status: Never Smoker   . Smokeless tobacco: Never Used  . Alcohol Use: No  . Drug Use: No  . Sexually Active: Yes    Birth Control/ Protection: Inserts   Other Topics Concern  . Not on file   Social History Narrative  . No narrative on file    Family History  Problem Relation Age of Onset  . Stroke Mother   . Breast cancer Mother   . Cancer Mother     breast  . Breast cancer Paternal Aunt   . Crohn's disease Father   . Colitis Maternal Aunt   . Colon cancer Paternal Grandmother     ? cancer, part of colon removed    Current Outpatient Prescriptions  Medication Sig Dispense Refill  . etonogestrel-ethinyl estradiol (NUVARING) 0.12-0.015 MG/24HR vaginal ring Place 1 each vaginally every 28 (twenty-eight) days. Insert vaginally and leave in place for 3 consecutive weeks, then remove for 1 week.       . mercaptopurine (PURINETHOL) 50 MG tablet Take 1 tablet (50 mg total) by mouth daily. Give on an empty stomach 1 hour before or 2 hours after meals. Caution: Chemotherapy.  30 tablet  2  . promethazine (PHENERGAN) 12.5 MG tablet Take 1-2 tablets (12.5-25 mg total) by mouth every 6 (six) hours as needed for nausea.  20 tablet  3     Allergies  Allergen Reactions  . Prednisone     Blurred vision, ringing in ears, insomnia, shaky, body convulsions,nausea    BP 100/58  Pulse 64  Temp 97.6 F (36.4 C) (Temporal)  Resp 12  Ht 5\' 3"  (1.6 m)  Wt 122 lb 3.2 oz (55.43 kg)  BMI 21.65 kg/m2  LMP 04/30/2012  No results found.   Review of Systems     Objective:   Physical Exam     Assessment:     ***    Plan:     ***

## 2012-05-13 ENCOUNTER — Other Ambulatory Visit (INDEPENDENT_AMBULATORY_CARE_PROVIDER_SITE_OTHER): Payer: BC Managed Care – PPO

## 2012-05-13 DIAGNOSIS — K5080 Crohn's disease of both small and large intestine without complications: Secondary | ICD-10-CM

## 2012-05-13 LAB — CBC WITH DIFFERENTIAL/PLATELET
Basophils Absolute: 0 10*3/uL (ref 0.0–0.1)
Basophils Relative: 0.7 % (ref 0.0–3.0)
Eosinophils Absolute: 0.1 10*3/uL (ref 0.0–0.7)
HCT: 35.8 % — ABNORMAL LOW (ref 36.0–46.0)
Hemoglobin: 11.9 g/dL — ABNORMAL LOW (ref 12.0–15.0)
Lymphs Abs: 1.1 10*3/uL (ref 0.7–4.0)
MCHC: 33.1 g/dL (ref 30.0–36.0)
MCV: 83.6 fl (ref 78.0–100.0)
Monocytes Absolute: 0.2 10*3/uL (ref 0.1–1.0)
Neutro Abs: 3.8 10*3/uL (ref 1.4–7.7)
RBC: 4.28 Mil/uL (ref 3.87–5.11)
RDW: 14.1 % (ref 11.5–14.6)

## 2012-05-13 LAB — COMPREHENSIVE METABOLIC PANEL
ALT: 6 U/L (ref 0–35)
AST: 11 U/L (ref 0–37)
CO2: 22 mEq/L (ref 19–32)
Creatinine, Ser: 0.7 mg/dL (ref 0.4–1.2)
Sodium: 136 mEq/L (ref 135–145)
Total Bilirubin: 0.7 mg/dL (ref 0.3–1.2)
Total Protein: 8.1 g/dL (ref 6.0–8.3)

## 2012-05-26 ENCOUNTER — Telehealth: Payer: Self-pay | Admitting: Gastroenterology

## 2012-05-26 ENCOUNTER — Observation Stay (HOSPITAL_COMMUNITY)
Admission: AD | Admit: 2012-05-26 | Discharge: 2012-05-28 | Disposition: A | Discharge status: Home / Self Care | Payer: BC Managed Care – PPO | Source: Ambulatory Visit | Attending: Internal Medicine | Admitting: Internal Medicine

## 2012-05-26 ENCOUNTER — Observation Stay (HOSPITAL_COMMUNITY): Payer: BC Managed Care – PPO

## 2012-05-26 ENCOUNTER — Encounter (HOSPITAL_COMMUNITY): Payer: Self-pay | Admitting: *Deleted

## 2012-05-26 ENCOUNTER — Ambulatory Visit (INDEPENDENT_AMBULATORY_CARE_PROVIDER_SITE_OTHER): Payer: BC Managed Care – PPO | Admitting: Physician Assistant

## 2012-05-26 ENCOUNTER — Encounter: Payer: Self-pay | Admitting: Physician Assistant

## 2012-05-26 VITALS — BP 122/78 | HR 60 | Ht 63.0 in | Wt 127.0 lb

## 2012-05-26 DIAGNOSIS — K50819 Crohn's disease of both small and large intestine with unspecified complications: Secondary | ICD-10-CM

## 2012-05-26 DIAGNOSIS — Z79899 Other long term (current) drug therapy: Secondary | ICD-10-CM | POA: Insufficient documentation

## 2012-05-26 DIAGNOSIS — R109 Unspecified abdominal pain: Secondary | ICD-10-CM | POA: Insufficient documentation

## 2012-05-26 DIAGNOSIS — N23 Unspecified renal colic: Secondary | ICD-10-CM

## 2012-05-26 DIAGNOSIS — R112 Nausea with vomiting, unspecified: Secondary | ICD-10-CM

## 2012-05-26 DIAGNOSIS — N201 Calculus of ureter: Principal | ICD-10-CM | POA: Insufficient documentation

## 2012-05-26 DIAGNOSIS — N1330 Unspecified hydronephrosis: Secondary | ICD-10-CM | POA: Insufficient documentation

## 2012-05-26 DIAGNOSIS — K5080 Crohn's disease of both small and large intestine without complications: Secondary | ICD-10-CM

## 2012-05-26 DIAGNOSIS — R52 Pain, unspecified: Secondary | ICD-10-CM

## 2012-05-26 DIAGNOSIS — R1031 Right lower quadrant pain: Secondary | ICD-10-CM

## 2012-05-26 DIAGNOSIS — K5090 Crohn's disease, unspecified, without complications: Secondary | ICD-10-CM

## 2012-05-26 DIAGNOSIS — N200 Calculus of kidney: Secondary | ICD-10-CM | POA: Diagnosis present

## 2012-05-26 LAB — COMPREHENSIVE METABOLIC PANEL
ALT: 5 U/L (ref 0–35)
Alkaline Phosphatase: 59 U/L (ref 39–117)
BUN: 10 mg/dL (ref 6–23)
CO2: 20 mEq/L (ref 19–32)
GFR calc Af Amer: >90 mL/min (ref >90)
GFR calc non Af Amer: >90 mL/min (ref >90)
Glucose, Bld: 107 mg/dL — ABNORMAL HIGH (ref 70–99)
Potassium: 4 mEq/L (ref 3.5–5.1)
Sodium: 136 mEq/L (ref 135–145)
Total Bilirubin: 0.5 mg/dL (ref 0.3–1.2)
Total Protein: 7.5 g/dL (ref 6.0–8.3)

## 2012-05-26 LAB — URINALYSIS, ROUTINE W REFLEX MICROSCOPIC
Bilirubin Urine: NEGATIVE
Glucose, UA: NEGATIVE mg/dL
Ketones, ur: 40 mg/dL — AB
Leukocytes, UA: NEGATIVE
Nitrite: NEGATIVE
Specific Gravity, Urine: 1.033 — ABNORMAL HIGH (ref 1.005–1.030)
pH: 6 (ref 5.0–8.0)

## 2012-05-26 LAB — CBC
HCT: 34.8 % — ABNORMAL LOW (ref 36.0–46.0)
Hemoglobin: 11.5 g/dL — ABNORMAL LOW (ref 12.0–15.0)
MCHC: 33 g/dL (ref 30.0–36.0)
RBC: 4.06 MIL/uL (ref 3.87–5.11)

## 2012-05-26 LAB — PROTIME-INR: Prothrombin Time: 14.3 seconds (ref 11.6–15.2)

## 2012-05-26 LAB — URINE MICROSCOPIC-ADD ON

## 2012-05-26 MED ORDER — ONDANSETRON HCL 4 MG/2ML IJ SOLN
4.0000 mg | Freq: Four times a day (QID) | INTRAMUSCULAR | Status: DC | PRN
  Administered 2012-05-26 – 2012-05-27 (×2): 4 mg via INTRAVENOUS
  Filled 2012-05-26 (×2): qty 2

## 2012-05-26 MED ORDER — ONDANSETRON HCL 4 MG PO TABS: 4.0000 mg | ORAL_TABLET | Freq: Four times a day (QID) | ORAL | Status: DC | PRN

## 2012-05-26 MED ORDER — DEXTROSE-NACL 5-0.45 % IV SOLN
INTRAVENOUS | Status: DC
  Administered 2012-05-26 – 2012-05-28 (×4): via INTRAVENOUS

## 2012-05-26 MED ORDER — HYDROMORPHONE HCL PF 1 MG/ML IJ SOLN: 0.5000 mg | INTRAMUSCULAR | Status: DC | PRN

## 2012-05-26 MED ORDER — TAMSULOSIN HCL 0.4 MG PO CAPS
0.4000 mg | ORAL_CAPSULE | Freq: Every day | ORAL | Status: DC
  Administered 2012-05-26 – 2012-05-27 (×2): 0.4 mg via ORAL
  Filled 2012-05-26 (×2): qty 1

## 2012-05-26 MED ORDER — ACETAMINOPHEN 650 MG RE SUPP: 650.0000 mg | Freq: Four times a day (QID) | RECTAL | Status: DC | PRN

## 2012-05-26 MED ORDER — IOHEXOL 300 MG/ML  SOLN
80.0000 mL | Freq: Once | INTRAMUSCULAR | Status: AC | PRN
  Administered 2012-05-26: 80 mL via INTRAVENOUS

## 2012-05-26 MED ORDER — HYDROMORPHONE HCL PF 1 MG/ML IJ SOLN
1.0000 mg | Freq: Once | INTRAMUSCULAR | Status: AC
  Administered 2012-05-26: 1 mg via INTRAVENOUS
  Filled 2012-05-26: qty 1

## 2012-05-26 MED ORDER — ACETAMINOPHEN 325 MG PO TABS: 650.0000 mg | ORAL_TABLET | Freq: Four times a day (QID) | ORAL | Status: DC | PRN

## 2012-05-26 MED ORDER — IOHEXOL 300 MG/ML  SOLN: 50.0000 mL | Freq: Once | INTRAMUSCULAR | Status: AC | PRN

## 2012-05-26 MED ORDER — HYDROMORPHONE HCL PF 1 MG/ML IJ SOLN
0.5000 mg | INTRAMUSCULAR | Status: DC | PRN
  Administered 2012-05-26 – 2012-05-27 (×3): 1 mg via INTRAVENOUS
  Filled 2012-05-26 (×3): qty 1

## 2012-05-26 NOTE — Telephone Encounter (Signed)
Agree she needs evaluation today.

## 2012-05-26 NOTE — Progress Notes (Deleted)
  Subjective:    Patient ID: Robin Miller, female    DOB: 28-Oct-1990, 22 y.o.   MRN: [id number]  HPI    Review of Systems     Objective:   Physical Exam        Assessment & Plan:

## 2012-05-26 NOTE — H&P (Signed)
Primary Care Physician:  [name], MD Primary Gastroenterologist:  Dr.Stark  CHIEF COMPLAINT:  Acute severe right back and right lower quadrant abdominal pain with nausea and vomiting  HPI: Robin Miller is a 22 y.o. female known to [name] who was just diagnosed with Crohn's disease earlier this year. She developed disease refractory to steroids and required a lap assisted ileocecectomy on 03/16/2012 per Dr. Michaell Cowing for active Crohn's disease with an ileal stricture and fistula. She did well postoperatively and was started on 6-MP about a month ago at 50 mg by mouth daily. She says this is made her feel slightly queasy but otherwise she has tolerated it well and was doing fine until last night when she awakened in the middle of the night with acute progressively severe pain in her right back radiating around into the right lower quadrant. Her pain has been constant and associated with nausea and multiple episodes of vomiting. She says she's been unable to keep anything down including sips of water and ginger ale today. Her last bowel movement was on Sunday, 05/24/2011. She has not had any documented fever but did feel that she had chills during the night. She also feels that she's not able to empty her bladder completely today. Is uncomfortable with walking. She rates her pain as an 8/10.   Past Medical History  Diagnosis Date  . Depression   . Vasovagal syncope   . Anemia   . Headache     last one 03/08/12  . Nausea vomiting and diarrhea   . Abdominal pain   . Cancer     basal cell back and face  . 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 Tonsillectomy and adenoidectomy   . Skin cancer excision   . Ileocetomy 03/16/2012  . Laparoscopic ileocecectomy 03/16/2012    Procedure: LAPAROSCOPIC ILEOCECECTOMY;  Surgeon: [name], MD;  Location: [facility] OR;   Service: General;  Laterality: N/A;  lap assisted ileocectomy    Prior to Admission medications   Medication Sig Start Date End Date Taking? Authorizing Provider  etonogestrel-ethinyl estradiol (NUVARING) 0.12-0.015 MG/24HR vaginal ring Place 1 each vaginally every 28 (twenty-eight) days. Insert vaginally and leave in place for 3 consecutive weeks, then remove for 1 week.    Yes Historical Provider, MD  mercaptopurine (PURINETHOL) 50 MG tablet Take 1 tablet (50 mg total) by mouth daily. Give on an empty stomach 1 hour before or 2 hours after meals. Caution: Chemotherapy. 04/30/12  Yes [name], MD,FACG  promethazine (PHENERGAN) 12.5 MG tablet Take 1-2 tablets (12.5-25 mg total) by mouth every 6 (six) hours as needed for nausea. 03/23/12  Yes [name], MD    No current outpatient prescriptions on file.    Allergies as of 05/26/2012 - Review Complete 05/26/2012  Allergen Reaction Noted  . Prednisone  11/04/2011    Family History  Problem Relation Age of Onset  . Stroke Mother   . Breast cancer Mother   . Cancer Mother     breast  . Breast cancer Paternal Aunt   . Crohn's disease Father   . Colitis Maternal Aunt   . Colon cancer Paternal Grandmother     ? cancer, part of colon removed    History   Social History  . Marital Status: Single    Spouse Name: N/A    Number of  Children: 0  . Years of Education: N/A   Occupational History  . student    Social History Main Topics  . Smoking status: Never Smoker   . Smokeless tobacco: Never Used  . Alcohol Use: No  . Drug Use: No  . Sexually Active: Yes    Birth Control/ Protection: Inserts   Other Topics Concern  . Not on file   Social History Narrative  . No narrative on file    Review of Systems: Pertinent positive and negative review of systems were noted in the above HPI section.  All other review of systems was otherwise negative.    Physical Exam: Vital signs in last 24 hours: Blood pressure  122/78 pulse 60 height 5 foot 3 weight 127 temp was 97 5    General:   Alert,  well-developed, cooperative  thin young white female  in NAD, uncomfortable appearing Head:  Normocephalic and atraumatic. Eyes:  Sclera clear, no icterus.   Conjunctiva pink. Ears:  Normal auditory acuity. Mouth:  No deformity or lesions.  Oropharynx pink & moist. Neck:  Supple; no masses or thyromegaly. Lungs:  Clear throughout to auscultation.   No wheezes, crackles, or rhonchi. No acute distress. Heart:  Regular rate and rhythm; no murmurs. Abdomen:  Soft she is tender in the left and right lower quadrant, with significant right lower quadrant tenderness and guarding as well as rebound. Her abdomen is slightly distended bowel sounds are present but hypoactive she has a well healed periumbilical incisional scar.    Rectal:   not done  Msk:  Symmetrical without gross deformities. Normal posture. Pulses:  Normal pulses noted. Extremities:  Without edema. Neurologic:  Alert and  oriented x4;  grossly normal neurologically. Skin:  Intact without significant lesions or rashes. Cervical Nodes:  No significant cervical adenopathy. Psych:  Alert and cooperative. Normal mood and affect, anxious and tearful  Lab Results: No results found for this basename: WBC:3,HGB:3,HCT:3,PLT:3 in the last 72 hours BMET No results found for this basename: NA:3,K:3,CL:3,CO2:3,GLUCOSE:3,BUN:3,CREATININE:3,CALCIUM:3 in the last 72 hours LFT No results found for this basename: PROT,ALBUMIN,AST,ALT,ALKPHOS,BILITOT,BILIDIR,IBILI in the last 72 hours PT/INR No results found for this basename: LABPROT:2,INR:2 in the last 72 hours Hepatitis Panel No results found for this basename: HEPBSAG,HCVAB,HEPAIGM,HEPBIGM in the last 72 hours    Impression / Plan:  #41  22 year old white female with Crohn's disease status post lap assisted ileocecectomy November 2013 due to active Crohn's disease with stricture and fistula formation. Recently  started immunomodulators therapy with 6-MP and now presenting with acute progressively severe right back and right lower quadrant abdominal pain over the past 12 hours associated with multiple episodes of nausea and vomiting. Will need to rule out bowel obstruction, abscess formation anastomotic breakdown or other acute intra-abdominal inflammatory process    Plan ; Patient is admitted to the GI service for pain control baseline labs and stat CT scan of the abdomen and pelvis. Further plans and management pending results of CT      @RRHLOS @  Amy Esterwood  05/26/2012, 4:19 PM

## 2012-05-26 NOTE — Patient Instructions (Addendum)
Go directly to [facility], admitting, first floor. You will be in the 5 [place] section of the hospital.

## 2012-05-26 NOTE — H&P (Signed)
Please see full History and Physical note from [employer], PA-C. Patient seen, examined, and I agree with the documentation, including the assessment and plan. CT scan abd/pelvis is pending for today. IVFs, pain control. Given pain starting in right lower back and radiating around, combined with urinary freq and hesitancy, I have ordered UA with culture to exclude infection.  Obstructing kidney stone +/- pyleo could cause similar symptoms. I seems a bit too early for recurrence of Crohn's disease, but obstruction at anastomosis is possible.  CT should help sort this out.

## 2012-05-26 NOTE — Consult Note (Signed)
Urology Consult   Physician requesting consult:  Dr. Erick Blinks  Reason for consult:  Right ureteral calculus  History of Present Illness: Robin Miller is a 22 y.o. female with a history of Crohn's disease s/p ileocecectomy in November.  She presented to her gastroenterologist today with complaints of severe right flank pain with radiation to her right lower quadrant.  Her pain began around 5 am this morning.  She has had associated nausea and vomiting but denies fever.  Her pain has been controlled with IV narcotics today. She denies a history of kidney stones.  She underwent a CT scan this evening for further evaluation and was found to have a 3 mm right UVJ calculus.  She does take mercaptopurine.   She denies a history of voiding or storage urinary symptoms, hematuria, UTIs, STDs, urolithiasis, GU malignancy/trauma/surgery.  Past Medical History  Diagnosis Date  . Depression   . Vasovagal syncope   . Anemia   . Headache     last one 03/08/12  . Nausea vomiting and diarrhea   . Abdominal pain   . Cancer     basal cell back and face  . 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 Tonsillectomy and adenoidectomy   . Skin cancer excision   . Ileocetomy 03/16/2012  . Laparoscopic ileocecectomy 03/16/2012    Procedure: LAPAROSCOPIC ILEOCECECTOMY;  Surgeon: [name], MD;  Location: [facility] OR;  Service: General;  Laterality: N/A;  lap assisted ileocectomy     Current Hospital Medications: Scheduled Meds:  Continuous Infusions:   . dextrose 5 % and 0.45% NaCl 100 mL/hr at 05/26/12 1716   PRN Meds:.acetaminophen, acetaminophen, HYDROmorphone (DILAUDID) injection, iohexol, ondansetron (ZOFRAN) IV, ondansetron  Allergies:  Allergies  Allergen Reactions  . Prednisone     Blurred vision, ringing in ears, insomnia, shaky, body convulsions,nausea     Family History  Problem Relation Age of Onset  . Stroke Mother   . Breast cancer Mother   . Cancer Mother     breast  . Breast cancer Paternal Aunt   . Crohn's disease Father   . Colitis Maternal Aunt   . Colon cancer Paternal Grandmother     ? cancer, part of colon removed   No family history of kidney stones.  Social History:  reports that she has never smoked. She has never used smokeless tobacco. She reports that she does not drink alcohol or use illicit drugs.  ROS: A complete review of systems was performed.  All systems are negative except for pertinent findings as noted.  Physical Exam:  Vital signs in last 24 hours: Temp:  [98.3 F (36.8 C)-98.4 F (36.9 C)] 98.3 F (36.8 C) (01/29 1824) Pulse Rate:  [60-67] 63  (01/29 1824) Resp:  [20] 20  (01/29 1824) BP: (122-126)/(78-84) 124/82 mmHg (01/29 1824) SpO2:  [99 %] 99 % (01/29 1824) Weight:  [56.7 kg (125 lb)-57.607 kg (127 lb)] 56.7 kg (125 lb) (01/29 1824) General:  Alert and oriented, No acute distress HEENT: Normocephalic, atraumatic Neck: No JVD or lymphadenopathy Cardiovascular: Regular rate and rhythm Lungs: Clear bilaterally Abdomen: Soft, ND, moderate right abdominal tenderness, no rebound tenderness or guarding Back: Severe right CVAT Extremities: No edema Neurologic: Grossly intact  Laboratory Data:   Basename 05/26/12 1743  WBC 7.1  HGB 11.5*  HCT 34.8*  PLT 267     Basename  05/26/12 1743  NA 136  K 4.0  CL 102  GLUCOSE 107*  BUN 10  CALCIUM 9.4  CREATININE 0.80     Results for orders placed during the hospital encounter of 05/26/12 (from the past 24 hour(s))  CBC     Status: Abnormal   Collection Time   05/26/12  5:43 PM      Component Value Range   WBC 7.1  4.0 - 10.5 K/uL   RBC 4.06  3.87 - 5.11 MIL/uL   Hemoglobin 11.5 (*) 12.0 - 15.0 g/dL   HCT 14.7 (*) 82.9 - 56.2 %   MCV 85.7  78.0 - 100.0 fL   MCH 28.3  26.0 - 34.0 pg   MCHC 33.0  30.0 - 36.0 g/dL   RDW 13.0   86.5 - 78.4 %   Platelets 267  150 - 400 K/uL  COMPREHENSIVE METABOLIC PANEL     Status: Abnormal   Collection Time   05/26/12  5:43 PM      Component Value Range   Sodium 136  135 - 145 mEq/L   Potassium 4.0  3.5 - 5.1 mEq/L   Chloride 102  96 - 112 mEq/L   CO2 20  19 - 32 mEq/L   Glucose, Bld 107 (*) 70 - 99 mg/dL   BUN 10  6 - 23 mg/dL   Creatinine, Ser 6.96  0.50 - 1.10 mg/dL   Calcium 9.4  8.4 - 29.5 mg/dL   Total Protein 7.5  6.0 - 8.3 g/dL   Albumin 3.5  3.5 - 5.2 g/dL   AST 11  0 - 37 U/L   ALT 5  0 - 35 U/L   Alkaline Phosphatase 59  39 - 117 U/L   Total Bilirubin 0.5  0.3 - 1.2 mg/dL   GFR calc non Af Amer >90  >90 mL/min   GFR calc Af Amer >90  >90 mL/min  PROTIME-INR     Status: Normal   Collection Time   05/26/12  5:43 PM      Component Value Range   Prothrombin Time 14.3  11.6 - 15.2 seconds   INR 1.13  0.00 - 1.49  LIPASE, BLOOD     Status: Normal   Collection Time   05/26/12  5:43 PM      Component Value Range   Lipase 28  11 - 59 U/L  URINALYSIS, ROUTINE W REFLEX MICROSCOPIC     Status: Abnormal   Collection Time   05/26/12  6:53 PM      Component Value Range   Color, Urine YELLOW  YELLOW   APPearance CLEAR  CLEAR   Specific Gravity, Urine 1.033 (*) 1.005 - 1.030   pH 6.0  5.0 - 8.0   Glucose, UA NEGATIVE  NEGATIVE mg/dL   Hgb urine dipstick MODERATE (*) NEGATIVE   Bilirubin Urine NEGATIVE  NEGATIVE   Ketones, ur 40 (*) NEGATIVE mg/dL   Protein, ur NEGATIVE  NEGATIVE mg/dL   Urobilinogen, UA 1.0  0.0 - 1.0 mg/dL   Nitrite NEGATIVE  NEGATIVE   Leukocytes, UA NEGATIVE  NEGATIVE  URINE MICROSCOPIC-ADD ON     Status: Abnormal   Collection Time   05/26/12  6:53 PM      Component Value Range   Squamous Epithelial / LPF FEW (*) RARE   RBC / HPF 3-6  <3 RBC/hpf   Bacteria, UA FEW (*) RARE   Urine-Other MUCOUS PRESENT     No results found for this  or any previous visit (from the past 240 hour(s)).  Urine culture is pending.  Renal Function:  Basename  05/26/12 1743  CREATININE 0.80   Estimated Creatinine Clearance: 91.2 ml/min (by C-G formula based on Cr of 0.8).  Radiologic Imaging: Ct Abdomen Pelvis W Contrast  05/26/2012  *RADIOLOGY REPORT*  Clinical Data: History of Crohn's disease.  Abdominal pain. Previous ileocecectomy.  CT ABDOMEN AND PELVIS WITH CONTRAST  Technique:  Multidetector CT imaging of the abdomen and pelvis was performed following the standard protocol during bolus administration of intravenous contrast.  Contrast: 80mL OMNIPAQUE IOHEXOL 300 MG/ML  SOLN  Comparison: 09/17/2011  Findings: Lung bases are clear.  No pleural or pericardial fluid. There is low density in the medial segment of the left lobe of the liver is presumed represent focal fatty change.  One could not rule out the possibility of and early focus of hepatic inflammation, but that seems less likely.  No calcified gallstones.  The spleen is normal.  The pancreas is normal.  The adrenal glands are normal. The aorta and IVC are normal.  The left kidney is normal.  The right kidney shows hydroureteronephrosis.  There is mild surrounding edema probably related to pyelosinus extravasation. The right ureter is dilated all the way to the UVJ where there is a 3 mm stone.  The patient has had previous ileocecectomy.  I do not see any evidence of active Crohn's inflammation.  Uterus and adnexal regions appear unremarkable.  There is a moderate amount of gas and fecal matter in the colon.  IMPRESSION: 3 mm stone at the right UVJ with mild right hydroureteronephrosis and peri nephric edema on the right consistent with pyelosinus extravasation.  No other urinary tract calculi are identified. This stone was not visible on the study of 09/17/2011 and presumably has developed since that time.  Previous ileocecectomy for Crohn's disease.  No evidence of active Crohn's change.  Low density in the medial segment of the left lobe of liver probably representing focal fatty change.   Original  Report Authenticated By: Paulina Fusi, M.D.     I independently reviewed the above imaging studies.  Impression/Assessment:  Right UVJ calculus  Plan:  - IV hydration and strain urine overnight. Continue IV pain control as needed but would work toward oral pain medication for control of pain.   - Considering she has a good chance of passing her stone, I have recommended medical expulsion therapy with tamsulosin and will begin tonight.  - I will reassess her in the morning.  If her pain is controlled and she is tolerating po intake and po pain medication, she can likely be discharged home with plans to followup as an outpatient.  If she continues to have severe nausea/vomiting or uncontrolled pain, she may need to be observed in the hospital longer and may require ureteroscopic intervention.  Luvern Mcisaac,LES 05/26/2012, 8:09 PM  Moody Bruins. MD   CC: Dr. Erick Blinks

## 2012-05-26 NOTE — Progress Notes (Signed)
Patient ID: Robin Miller, female   DOB: 12-11-1990, 22 y.o.   MRN: [id number]  Robin Miller is a 22 year old female known to [name] with diagnosis of Crohn's ileal colitis who underwent ileocecal rectum he 03/16/2012 per Dr. Michaell Cowing secondary to active Crohn's with stricture of the terminal ileum and fistula formation. She has done well postoperatively until last evening when she was awakened in the middle the night with acute right-sided back pain which then radiated around into her right lower quadrant and has been associated with nausea and vomiting She was started on 6-MP about a month ago. She is seen as an urgent add-on in the office this afternoon and admitted to [facility] for medical management and CT of the abdomen and pelvis. For details please see the dictated H&P.

## 2012-05-26 NOTE — Telephone Encounter (Signed)
Patient woke this am with lower right sided back pain, the pain progressed to right lower quad.  She developed vomiting this am around 5, she is not able to tolerate any fluids or foods.  No BM since Sunday.  She feels febrile, but she does not have a thermometer.  She has a history of crohn's and ileocectomy in the past.  She will come in and see Mike Gip PA today at 2:30

## 2012-05-27 ENCOUNTER — Observation Stay (HOSPITAL_COMMUNITY): Payer: BC Managed Care – PPO | Admitting: Anesthesiology

## 2012-05-27 ENCOUNTER — Encounter (HOSPITAL_COMMUNITY): Payer: Self-pay | Admitting: Anesthesiology

## 2012-05-27 ENCOUNTER — Encounter (HOSPITAL_COMMUNITY)
Admission: AD | Disposition: A | Discharge status: Home / Self Care | Payer: Self-pay | Source: Ambulatory Visit | Attending: Internal Medicine

## 2012-05-27 ENCOUNTER — Other Ambulatory Visit: Payer: Self-pay | Admitting: Internal Medicine

## 2012-05-27 DIAGNOSIS — N23 Unspecified renal colic: Secondary | ICD-10-CM | POA: Diagnosis present

## 2012-05-27 DIAGNOSIS — N200 Calculus of kidney: Secondary | ICD-10-CM | POA: Diagnosis present

## 2012-05-27 HISTORY — PX: HOLMIUM LASER APPLICATION: SHX5852

## 2012-05-27 HISTORY — PX: CYSTOSCOPY WITH RETROGRADE PYELOGRAM, URETEROSCOPY AND STENT PLACEMENT: SHX5789

## 2012-05-27 SURGERY — HOLMIUM LASER APPLICATION
Anesthesia: General | Site: Ureter | Laterality: Right | Wound class: Clean Contaminated

## 2012-05-27 MED ORDER — PROMETHAZINE HCL 25 MG/ML IJ SOLN: 6.2500 mg | INTRAMUSCULAR | Status: DC | PRN

## 2012-05-27 MED ORDER — LIDOCAINE HCL (CARDIAC) 20 MG/ML IV SOLN
INTRAVENOUS | Status: DC | PRN
  Administered 2012-05-27: 50 mg via INTRAVENOUS

## 2012-05-27 MED ORDER — SODIUM CHLORIDE 0.9 % IR SOLN
Status: DC | PRN
  Administered 2012-05-27: 3000 mL via INTRAVESICAL

## 2012-05-27 MED ORDER — ACETAMINOPHEN 10 MG/ML IV SOLN
INTRAVENOUS | Status: DC | PRN
  Administered 2012-05-27: 1000 mg via INTRAVENOUS

## 2012-05-27 MED ORDER — HYDROMORPHONE HCL PF 1 MG/ML IJ SOLN: 0.2500 mg | INTRAMUSCULAR | Status: DC | PRN

## 2012-05-27 MED ORDER — OXYCODONE HCL 5 MG PO TABS
5.0000 mg | ORAL_TABLET | ORAL | Status: DC | PRN
  Administered 2012-05-28: 5 mg via ORAL
  Filled 2012-05-27: qty 1

## 2012-05-27 MED ORDER — DEXTROSE 5 % IV SOLN
INTRAVENOUS | Status: DC | PRN
  Administered 2012-05-27: 17:00:00 via INTRAVENOUS

## 2012-05-27 MED ORDER — CIPROFLOXACIN IN D5W 400 MG/200ML IV SOLN
400.0000 mg | Freq: Once | INTRAVENOUS | Status: AC
  Administered 2012-05-27: 400 mg via INTRAVENOUS

## 2012-05-27 MED ORDER — SUCCINYLCHOLINE CHLORIDE 20 MG/ML IJ SOLN
INTRAMUSCULAR | Status: DC | PRN
  Administered 2012-05-27: 100 mg via INTRAVENOUS

## 2012-05-27 MED ORDER — IOHEXOL 300 MG/ML  SOLN
INTRAMUSCULAR | Status: DC | PRN
  Administered 2012-05-27: 10 mL via URETHRAL

## 2012-05-27 MED ORDER — LACTATED RINGERS IV SOLN
INTRAVENOUS | Status: DC
  Administered 2012-05-27: 1000 mL via INTRAVENOUS

## 2012-05-27 MED ORDER — FENTANYL CITRATE 0.05 MG/ML IJ SOLN
INTRAMUSCULAR | Status: DC | PRN
  Administered 2012-05-27 (×5): 50 ug via INTRAVENOUS

## 2012-05-27 MED ORDER — ACETAMINOPHEN 10 MG/ML IV SOLN: 1000.0000 mg | Freq: Once | INTRAVENOUS | Status: DC | PRN

## 2012-05-27 MED ORDER — OXYCODONE HCL 5 MG PO TABS: 5.0000 mg | ORAL_TABLET | Freq: Once | ORAL | Status: DC | PRN

## 2012-05-27 MED ORDER — SODIUM CHLORIDE 0.9 % IV SOLN
INTRAVENOUS | Status: DC | PRN
  Administered 2012-05-27: 18:00:00 via INTRAVENOUS

## 2012-05-27 MED ORDER — PROMETHAZINE HCL 25 MG/ML IJ SOLN
12.5000 mg | Freq: Four times a day (QID) | INTRAMUSCULAR | Status: DC | PRN
  Administered 2012-05-27: 12.5 mg via INTRAVENOUS
  Filled 2012-05-27 (×2): qty 1

## 2012-05-27 MED ORDER — OXYCODONE HCL 5 MG/5ML PO SOLN
5.0000 mg | Freq: Once | ORAL | Status: DC | PRN
  Filled 2012-05-27: qty 5

## 2012-05-27 MED ORDER — HYDROMORPHONE HCL PF 1 MG/ML IJ SOLN
0.5000 mg | INTRAMUSCULAR | Status: DC | PRN
  Administered 2012-05-27 (×4): 2 mg via INTRAVENOUS
  Filled 2012-05-27 (×4): qty 2

## 2012-05-27 MED ORDER — MEPERIDINE HCL 50 MG/ML IJ SOLN: 6.2500 mg | INTRAMUSCULAR | Status: DC | PRN

## 2012-05-27 MED ORDER — METOCLOPRAMIDE HCL 5 MG/ML IJ SOLN
INTRAMUSCULAR | Status: DC | PRN
  Administered 2012-05-27: 5 mg via INTRAVENOUS

## 2012-05-27 MED ORDER — HYDROMORPHONE HCL PF 1 MG/ML IJ SOLN
0.5000 mg | INTRAMUSCULAR | Status: DC | PRN
  Administered 2012-05-27 (×2): 1 mg via INTRAVENOUS
  Administered 2012-05-28: 2 mg via INTRAVENOUS
  Administered 2012-05-28: 1 mg via INTRAVENOUS
  Filled 2012-05-27 (×2): qty 1
  Filled 2012-05-27 (×2): qty 2

## 2012-05-27 MED ORDER — ONDANSETRON HCL 4 MG/2ML IJ SOLN
4.0000 mg | Freq: Four times a day (QID) | INTRAMUSCULAR | Status: DC
  Administered 2012-05-27 – 2012-05-28 (×2): 4 mg via INTRAVENOUS
  Filled 2012-05-27 (×2): qty 2

## 2012-05-27 MED ORDER — PROPOFOL 10 MG/ML IV EMUL
INTRAVENOUS | Status: DC | PRN
  Administered 2012-05-27: 150 mg via INTRAVENOUS

## 2012-05-27 MED ORDER — ONDANSETRON HCL 4 MG/2ML IJ SOLN
INTRAMUSCULAR | Status: DC | PRN
  Administered 2012-05-27 (×2): 2 mg via INTRAVENOUS

## 2012-05-27 MED ORDER — MIDAZOLAM HCL 5 MG/5ML IJ SOLN
INTRAMUSCULAR | Status: DC | PRN
  Administered 2012-05-27: 1 mg via INTRAVENOUS

## 2012-05-27 MED ORDER — LACTATED RINGERS IV SOLN
INTRAVENOUS | Status: DC | PRN
  Administered 2012-05-27: 17:00:00 via INTRAVENOUS

## 2012-05-27 MED ORDER — ONDANSETRON HCL 4 MG PO TABS
4.0000 mg | ORAL_TABLET | Freq: Four times a day (QID) | ORAL | Status: DC
  Administered 2012-05-28: 4 mg via ORAL
  Filled 2012-05-27 (×4): qty 1

## 2012-05-27 SURGICAL SUPPLY — 18 items
ADAPTER CATH URET PLST 4-6FR (CATHETERS) IMPLANT
BAG URO CATCHER STRL LF (DRAPE) ×3 IMPLANT
BASKET ZERO TIP NITINOL 2.4FR (BASKET) ×3 IMPLANT
CATH INTERMIT  6FR 70CM (CATHETERS) ×3 IMPLANT
CLOTH BEACON ORANGE TIMEOUT ST (SAFETY) ×3 IMPLANT
DRAPE CAMERA CLOSED 9X96 (DRAPES) ×3 IMPLANT
GLOVE BIOGEL M STRL SZ7.5 (GLOVE) ×3 IMPLANT
GOWN PREVENTION PLUS XLARGE (GOWN DISPOSABLE) ×3 IMPLANT
GOWN STRL NON-REIN LRG LVL3 (GOWN DISPOSABLE) ×6 IMPLANT
GUIDEWIRE ANG ZIPWIRE 038X150 (WIRE) IMPLANT
GUIDEWIRE STR DUAL SENSOR (WIRE) ×6 IMPLANT
LASER FIBER DISP (UROLOGICAL SUPPLIES) ×3 IMPLANT
MANIFOLD NEPTUNE II (INSTRUMENTS) ×3 IMPLANT
MASK EYE SHIELD (GAUZE/BANDAGES/DRESSINGS) ×3 IMPLANT
PACK CYSTO (CUSTOM PROCEDURE TRAY) ×3 IMPLANT
STENT CONTOUR 6FRX24X.038 (STENTS) ×3 IMPLANT
TUBING CONNECTING 10 (TUBING) ×3 IMPLANT
WIRE COONS/BENSON .038X145CM (WIRE) IMPLANT

## 2012-05-27 NOTE — Care Management Note (Signed)
    Page 1 of 1   05/27/2012     1:17:55 PM   CARE MANAGEMENT NOTE 05/27/2012  Patient:  Robin Miller, Robin Miller   Account Number:  [id number]  Date Initiated:  05/27/2012  Documentation initiated by:  Lorenda Ishihara  Subjective/Objective Assessment:   22 yo female admitted with kidny stone. PTA lived at home alone.     Action/Plan:   Home when stable   Anticipated DC Date:  05/28/2012   Anticipated DC Plan:  HOME/SELF CARE      DC Planning Services  CM consult      Choice offered to / List presented to:             Status of service:  Completed, signed off Medicare Important Message given?   (If response is "NO", the following Medicare IM given date fields will be blank) Date Medicare IM given:   Date Additional Medicare IM given:    Discharge Disposition:  HOME/SELF CARE  Per UR Regulation:  Reviewed for med. necessity/level of care/duration of stay  If discussed at Long Length of Stay Meetings, dates discussed:    Comments:

## 2012-05-27 NOTE — Progress Notes (Signed)
INITIAL NUTRITION ASSESSMENT  Pt meets criteria for severe MALNUTRITION in the context of chronic illness as evidenced by <75% estimated energy intake with 10.7-13.8% weight loss in the past 3 months.  DOCUMENTATION CODES Per approved criteria  -Severe malnutrition in the context of chronic illness   INTERVENTION: - Diet advancement per MD - Will continue to monitor   NUTRITION DIAGNOSIS: Altered GI function related to nausea/vomiting as evidenced by pt statement.   Goal: 1. Resolution of nausea/vomiting 2. Advance diet as tolerated to regular diet  Monitor:  Weights, labs, intake, nausea/vomiting, diet advancement  Reason for Assessment: Nutrition risk   22 y.o. female  Admitting Dx: Acute severe right back and right lower quadrant abdominal pain with nausea and vomiting   ASSESSMENT: Pt reports poor appetite since ileocecectomy surgery in November 2013. Pt reports 15-20 pound unintended weight loss during this time. Pt reports she has been eating 2 meals/day and drinking 1 Boost every other day. Pt admitted with vomiting that started yesterday accompanied by nausea and RLQ pain. Pt reports her pain and nausea are better however she had 3 episodes of emesis so far today - one at midnight and two before 7am. Pt reports this emesis was mostly green/yellow foamy spit up. Pt has not been interested in eating/drinking other than a Coke.   Height: Ht Readings from Last 1 Encounters:  05/26/12 5\' 3"  (1.6 m)    Weight: Wt Readings from Last 1 Encounters:  05/26/12 125 lb (56.7 kg)    Ideal Body Weight: 115 lb  % Ideal Body Weight: 109  Wt Readings from Last 10 Encounters:  05/26/12 125 lb (56.7 kg)  05/26/12 127 lb (57.607 kg)  05/03/12 122 lb 3.2 oz (55.43 kg)  04/30/12 123 lb (55.792 kg)  03/31/12 126 lb (57.153 kg)  03/16/12 135 lb (61.236 kg)  03/16/12 135 lb (61.236 kg)  03/09/12 133 lb 9.6 oz (60.6 kg)  02/05/12 138 lb 6.4 oz (62.778 kg)  01/26/12 138 lb 12.8 oz  (62.959 kg)    Usual Body Weight: 140-145 lb  % Usual Body Weight: 86-89  BMI:  Body mass index is 22.14 kg/(m^2).  Estimated Nutritional Needs: Kcal: 1425-1700 Protein: 70-80g Fluid: 1.4-1.7L/day  Skin: Intact   Diet Order: Clear Liquid  EDUCATION NEEDS: -No education needs identified at this time   Intake/Output Summary (Last 24 hours) at 05/27/12 1135 Last data filed at 05/27/12 1103  Gross per 24 hour  Intake 2633.33 ml  Output    975 ml  Net 1658.33 ml    Last BM: 1/26  Labs:   Lab 05/26/12 1743  NA 136  K 4.0  CL 102  CO2 20  BUN 10  CREATININE 0.80  CALCIUM 9.4  MG --  PHOS --  GLUCOSE 107*    CBG (last 3)  No results found for this basename: GLUCAP:3 in the last 72 hours  Scheduled Meds:   . ondansetron (ZOFRAN) IV  4 mg Intravenous Q6H   Or  . ondansetron  4 mg Oral Q6H  . Tamsulosin HCl  0.4 mg Oral Daily    Continuous Infusions:   . dextrose 5 % and 0.45% NaCl 100 mL/hr at 05/27/12 1610    Past Medical History  Diagnosis Date  . Depression   . Vasovagal syncope   . Anemia   . Headache     last one 03/08/12  . Nausea vomiting and diarrhea   . Abdominal pain   . Cancer  basal cell back and face  . 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 Tonsillectomy and adenoidectomy   . Skin cancer excision   . Ileocetomy 03/16/2012  . Laparoscopic ileocecectomy 03/16/2012    Procedure: LAPAROSCOPIC ILEOCECECTOMY;  Surgeon: [name], MD;  Location: [facility] OR;  Service: General;  Laterality: N/A;  lap assisted ileocectomy    Levon Hedger MS, RD, LDN [phone] Pager [phone] After Hours Pager

## 2012-05-27 NOTE — Preoperative (Signed)
Beta Blockers   Reason not to administer Beta Blockers:Not Applicable 

## 2012-05-27 NOTE — Op Note (Signed)
Preoperative diagnosis: Right ureteral calculus  Postoperative diagnosis: Right ureteral calculus  Procedure:  1. Cystoscopy 2. Right ureteroscopy and stone removal 3. Ureteroscopic laser lithotripsy 4. Right ureteral stent placement (6 x 24) 5. Right retrograde pyelography with interpretation  Surgeon: Moody Bruins. M.D.  Anesthesia: General  Complications: None  Intraoperative findings: Right retrograde pyelography demonstrated a filling defect within the distal right ureter consistent with the patient's known calculus without other abnormalities.  EBL: Minimal  Specimens: 1. Right ureteral calculus  Disposition of specimens: [facility] for stone analysis  Indication: Robin Miller is a 22 y.o. year old patient with urolithiasis. After reviewing the management options for treatment, the patient elected to proceed with the above surgical procedure(s). We have discussed the potential benefits and risks of the procedure, side effects of the proposed treatment, the likelihood of the patient achieving the goals of the procedure, and any potential problems that might occur during the procedure or recuperation. Informed consent has been obtained.  Description of procedure:  The patient was taken to the operating room and general anesthesia was induced.  The patient was placed in the dorsal lithotomy position, prepped and draped in the usual sterile fashion, and preoperative antibiotics were administered. A preoperative time-out was performed.   Cystourethroscopy was performed.  The patient's urethra was examined and was normal. The bladder was then systematically examined in its entirety. There was no evidence for any bladder tumors, stones, or other mucosal pathology.    Attention then turned to the right ureteral orifice and a ureteral catheter was used to intubate the ureteral orifice.  Omnipaque contrast was injected through the ureteral catheter and a  retrograde pyelogram was performed with findings as dictated above.  A 0.38 sensor guidewire was then advanced up the right ureter into the renal pelvis under fluoroscopic guidance. The 6 Fr semirigid ureteroscope was then advanced into the ureter next to the guidewire.  The distal ureter was quite narrow and a 2nd wire was used to help guide the ureteroscope past this narrowed area.  Beyond this point, the ureter was dilated and the calculus was identified.   The stone was then fragmented with the 365 micron holmium laser fiber on a setting of 0.6 J and frequency of 6 Hz.   All stones were then removed from the ureter with a zero tip nitinol basket.  Reinspection of the ureter revealed no remaining visible stones or fragments.   The wire was then backloaded through the cystoscope and a ureteral stent was advance over the wire using Seldinger technique.  The stent was positioned appropriately under fluoroscopic and cystoscopic guidance.  The wire was then removed with an adequate stent curl noted in the renal pelvis as well as in the bladder.  The bladder was then emptied and the procedure ended.  The patient appeared to tolerate the procedure well and without complications.  The patient was able to be awakened and transferred to the recovery unit in satisfactory condition.

## 2012-05-27 NOTE — Anesthesia Postprocedure Evaluation (Signed)
Anesthesia Post Note  Patient: Robin Miller  Procedure(s) Performed: Procedure(s) (LRB): HOLMIUM LASER APPLICATION (Right) CYSTOSCOPY WITH RETROGRADE PYELOGRAM, URETEROSCOPY AND STENT PLACEMENT (Right)  Anesthesia type: General  Patient location: PACU  Post pain: Pain level controlled  Post assessment: Post-op Vital signs reviewed  Last Vitals: BP 121/79  Pulse 103  Temp 36.7 C (Oral)  Resp 12  Ht 5\' 3"  (1.6 m)  Wt 125 lb (56.7 kg)  BMI 22.14 kg/m2  SpO2 98%  LMP 04/30/2012  Post vital signs: Reviewed  Level of consciousness: sedated  Complications: No apparent anesthesia complications

## 2012-05-27 NOTE — Progress Notes (Signed)
Patient ID: Robin Miller, female   DOB: [birth year], 22 y.o.   MRN: [id number]    Subjective: Pt still with significant nausea and emesis overnight.   Pain controlled but requiring IV pain medication for control.  Objective: Vital signs in last 24 hours: Temp:  [98.3 F (36.8 C)-98.8 F (37.1 C)] 98.8 F (37.1 C) (01/29 2218) Pulse Rate:  [60-67] 67  (01/29 2218) Resp:  [18-20] 18  (01/29 2218) BP: (116-126)/(78-84) 116/80 mmHg (01/29 2218) SpO2:  [99 %-100 %] 100 % (01/29 2218) Weight:  [56.7 kg (125 lb)-57.607 kg (127 lb)] 56.7 kg (125 lb) (01/29 1824)  Intake/Output from previous day: 01/29 0701 - 01/30 0700 In: 1453.3 [P.O.:600; I.V.:853.3] Out: 550 [Urine:300; Emesis/NG output:250] Intake/Output this shift: Total I/O In: 1453.3 [P.O.:600; I.V.:853.3] Out: 550 [Urine:300; Emesis/NG output:250]  Physical Exam:  General: Alert and oriented Abdomen: Severe right CVAT   Lab Results:  Basename 05/26/12 1743  HGB 11.5*  HCT 34.8*   BMET  Basename 05/26/12 1743  NA 136  K 4.0  CL 102  CO2 20  GLUCOSE 107*  BUN 10  CREATININE 0.80  CALCIUM 9.4      Assessment/Plan: 1) Right distal ureteral calculus: Pt is not ready to go home today considering her severe nausea/vomiting and requirement of IV pain medication overnight.  I have given her the option of proceeding with ureteroscopic surgical intervention vs. continued observation in the hospital with IV hydration and pain control.  She elects the latter for now although I will reassess her later today to make further plans.   LOS: 1 day   Fey Coghill,LES 05/27/2012, 6:14 AM

## 2012-05-27 NOTE — Progress Notes (Signed)
Patient ID: LENORA GOMES, female   DOB: 04/20/1991, 22 y.o.   MRN: [id number] [facility] Progress Note  Subjective: Pain is about the same-Dilaudid relieves but when it wears off pain comes right back... Still nauseated, no appetite  Objective:  Vital signs in last 24 hours: Temp:  [98.1 F (36.7 C)-98.8 F (37.1 C)] 98.1 F (36.7 C) (01/30 0610) Pulse Rate:  [60-74] 74  (01/30 0610) Resp:  [18-20] 18  (01/30 0610) BP: (102-126)/(68-84) 102/68 mmHg (01/30 0610) SpO2:  [98 %-100 %] 98 % (01/30 0610) Weight:  [125 lb (56.7 kg)-127 lb (57.607 kg)] 125 lb (56.7 kg) (01/29 1824) Last BM Date: 05/23/12 General:   Alert,  Well-developed,    in NAD Heart:  Regular rate and rhythm; no murmurs Pulm;clear Abdomen:  Soft,tender  RLQ and nondistended. Normal bowel sounds, without guarding, and without rebound.   Extremities:  Without edema. Neurologic:  Alert and  oriented x4;  grossly normal neurologically. Psych:  Alert and cooperative. Normal mood and affect.  Intake/Output from previous day: 01/29 0701 - 01/30 0700 In: 2183.3 [P.O.:840; I.V.:1343.3] Out: 550 [Urine:300; Emesis/NG output:250] Intake/Output this shift:    Lab Results:  Basename 05/26/12 1743  WBC 7.1  HGB 11.5*  HCT 34.8*  PLT 267   BMET  Basename 05/26/12 1743  NA 136  K 4.0  CL 102  CO2 20  GLUCOSE 107*  BUN 10  CREATININE 0.80  CALCIUM 9.4   LFT  Basename 05/26/12 1743  PROT 7.5  ALBUMIN 3.5  AST 11  ALT 5  ALKPHOS 59  BILITOT 0.5  BILIDIR --  IBILI --   PT/INR  Basename 05/26/12 1743  LABPROT 14.3  INR 1.13    Assessment / Plan: #1  22 yo female with right UPJ stone with hydro- Urology consult appreciated-plan is to continue observation today and if no improvement then proceed with stone extraction  prior to discharge.  #2 Crohns disease-s/p ileocecectomy 11/13. Will resume 6mp when tolerating pos. Thankfully, kidney stone will be a lot easier to deal with than an  anastomotic obstruction, etc.      LOS: 1 day   Amy Esterwood  05/27/2012, 9:12 AM

## 2012-05-27 NOTE — Transfer of Care (Signed)
Immediate Anesthesia Transfer of Care Note  Patient: Robin Miller  Procedure(s) Performed: Procedure(s) (LRB) with comments: HOLMIUM LASER APPLICATION (Right) -   CYSTOSCOPY WITH RETROGRADE PYELOGRAM, URETEROSCOPY AND STENT PLACEMENT (Right)  Patient Location: PACU  Anesthesia Type:General  Level of Consciousness: awake, alert , oriented, patient cooperative, lethargic and responds to stimulation  Airway & Oxygen Therapy: Patient Spontanous Breathing and Patient connected to face mask oxygen  Post-op Assessment: Report given to PACU RN, Post -op Vital signs reviewed and stable and Patient moving all extremities  Post vital signs: Reviewed and stable  Complications: No apparent anesthesia complications

## 2012-05-27 NOTE — Progress Notes (Signed)
Patient ID: Robin Miller, female   DOB: [birth year], 22 y.o.   MRN: [id number]  I re-evaluated Ms. Robin Miller this afternoon.  She continues to have severe pain and nausea.  At this time, she wishes to proceed with surgical intervention. We have reviewed options and she would like to proceed with right ureteroscopic laser lithotripsy and stone removal.  I discussed the potential benefits and risks of the procedure, side effects of the proposed treatment, the likelihood of the patient achieving the goals of the procedure, and any potential problems that might occur during the procedure or recuperation. She gives her informed consent.  She will be added onto the OR schedule for later tonight and likely would be ready for discharge in the morning.

## 2012-05-27 NOTE — Progress Notes (Signed)
Patient seen, examined, and I agree with the above documentation, including the assessment and plan. Still uncomfortable with nausea.  She has required IV narcotics this morning for pain control Appreciate urology input and consultation; plan is for symptomatic control until she passes the stone versus ureteroscopic intervention Supportive care, IVFs, strain urine

## 2012-05-27 NOTE — Anesthesia Preprocedure Evaluation (Addendum)
Anesthesia Evaluation  Patient identified by MRN, date of birth, ID band Patient awake    Reviewed: Allergy & Precautions, H&P , NPO status , Patient's Chart, lab work & pertinent test results, reviewed documented beta blocker date and time   Airway Mallampati: II TM Distance: >3 FB Neck ROM: full    Dental  (+) Teeth Intact and Dental Advisory Given   Pulmonary neg pulmonary ROS,  breath sounds clear to auscultation  Pulmonary exam normal       Cardiovascular negative cardio ROS  Rhythm:regular Rate:Normal     Neuro/Psych  Headaches, PSYCHIATRIC DISORDERS Depression    GI/Hepatic negative GI ROS, Neg liver ROS,   Endo/Other  negative endocrine ROS  Renal/GU Renal diseasenegative Renal ROS     Musculoskeletal   Abdominal   Peds  Hematology negative hematology ROS (+)   Anesthesia Other Findings   Reproductive/Obstetrics negative OB ROS                          Anesthesia Physical  Anesthesia Plan  ASA: II  Anesthesia Plan: General   Post-op Pain Management:    Induction: Intravenous and Rapid sequence  Airway Management Planned: Oral ETT  Additional Equipment:   Intra-op Plan:   Post-operative Plan: Extubation in OR  Informed Consent: I have reviewed the patients History and Physical, chart, labs and discussed the procedure including the risks, benefits and alternatives for the proposed anesthesia with the patient or authorized representative who has indicated his/her understanding and acceptance.   Dental advisory given  Plan Discussed with: CRNA  Anesthesia Plan Comments:         Anesthesia Quick Evaluation

## 2012-05-28 ENCOUNTER — Ambulatory Visit: Payer: BC Managed Care – PPO | Admitting: Gastroenterology

## 2012-05-28 ENCOUNTER — Encounter (HOSPITAL_COMMUNITY): Payer: Self-pay | Admitting: Urology

## 2012-05-28 LAB — URINE CULTURE
Colony Count: NO GROWTH
Culture: NO GROWTH

## 2012-05-28 MED ORDER — OXYCODONE HCL 10 MG PO TABS: 10.0000 mg | ORAL_TABLET | Freq: Four times a day (QID) | ORAL | Status: DC | PRN

## 2012-05-28 MED ORDER — ONDANSETRON HCL 4 MG PO TABS: 4.0000 mg | ORAL_TABLET | Freq: Three times a day (TID) | ORAL | Status: DC | PRN

## 2012-05-28 MED FILL — Ciprofloxacin 400 MG/200ML in D5W: INTRAVENOUS | Qty: 200 | Status: AC

## 2012-05-28 NOTE — Progress Notes (Signed)
DC instructions reviewed using teach back method. All questions and concerns addressed. Pt A&Ox4, VSS, NAD, tolerating a regular diet without any pain or nausea, skin intact. All belongings sent home with pt and wheeled out by NT.

## 2012-05-28 NOTE — Progress Notes (Signed)
Patient ID: Robin Miller, female   DOB: 1990-11-15, 22 y.o.   MRN: [id number] [facility] Progress Note  Subjective: Robin Miller is feeling much better since stone removal last pm-some cramping, eating breakfast  Objective:  Vital signs in last 24 hours: Temp:  [97.7 F (36.5 C)-99 F (37.2 C)] 98.1 F (36.7 C) (01/31 0604) Pulse Rate:  [64-107] 90  (01/31 0604) Resp:  [12-16] 16  (01/31 0604) BP: (92-121)/(57-79) 110/76 mmHg (01/31 0604) SpO2:  [96 %-100 %] 97 % (01/31 0604) Last BM Date: 05/23/12 General:   Alert,  Well-developed,    in NAD Heart:  Regular rate and rhythm; no murmurs Pulm;clear Abdomen:  Soft, tender RLQ  and nondistended. Normal bowel sounds, without guarding, and without rebound.   Extremities:  Without edema. Neurologic:  Alert and  oriented x4;  grossly normal neurologically. Psych:  Alert and cooperative. Normal mood and affect.  Intake/Output from previous day: 01/30 0701 - 01/31 0700 In: 3166.7 [P.O.:240; I.V.:2926.7] Out: 1925 [Urine:1925] Intake/Output this shift:    Lab Results:  Basename 05/26/12 1743  WBC 7.1  HGB 11.5*  HCT 34.8*  PLT 267   BMET  Basename 05/26/12 1743  NA 136  K 4.0  CL 102  CO2 20  GLUCOSE 107*  BUN 10  CREATININE 0.80  CALCIUM 9.4   LFT  Basename 05/26/12 1743  PROT 7.5  ALBUMIN 3.5  AST 11  ALT 5  ALKPHOS 59  BILITOT 0.5  BILIDIR --  IBILI --   PT/INR  Basename 05/26/12 1743  LABPROT 14.3  INR 1.13      Assessment / Plan: #1  22 yo female stable s/p Right UPJ stone removal and stent placement #2 Crohns disease  Will discharge home later this am-let her try some solid food Resume Follow up Dr. Laverle Patter 10 days Vicodin for pain Active Problems:  Renal stone  Renal colic on right side     LOS: 2 days   Jacy Howat  05/28/2012, 9:05 AM

## 2012-05-28 NOTE — Discharge Summary (Signed)
[facility] Discharge Summary  Name: Robin Miller MRN: [id number] DOB: 1990-12-25 21 y.o. PCP:  [name], MD  Date of Admission: 05/26/2012  3:29 PM Date of Discharge: 05/28/2012 Attending Physician: Beverley Fiedler, MD  Discharge Diagnosis: Active Problems:  Renal stone  Renal colic on right side #1  Right UPJ stone-s/p removal /lithotripsy and stent placement 1/30 2014 #2 Crohns disease- stable- s/p ileocecectomy 02/2012  Consultations: Treatment Team:  Crecencio Mc, MD  Procedures Performed:  Ct Abdomen Pelvis W Contrast  05/26/2012  *RADIOLOGY REPORT*  Clinical Data: History of Crohn's disease.  Abdominal pain. Previous ileocecectomy.  CT ABDOMEN AND PELVIS WITH CONTRAST  Technique:  Multidetector CT imaging of the abdomen and pelvis was performed following the standard protocol during bolus administration of intravenous contrast.  Contrast: 80mL OMNIPAQUE IOHEXOL 300 MG/ML  SOLN  Comparison: 09/17/2011  Findings: Lung bases are clear.  No pleural or pericardial fluid. There is low density in the medial segment of the left lobe of the liver is presumed represent focal fatty change.  One could not rule out the possibility of and early focus of hepatic inflammation, but that seems less likely.  No calcified gallstones.  The spleen is normal.  The pancreas is normal.  The adrenal glands are normal. The aorta and IVC are normal.  The left kidney is normal.  The right kidney shows hydroureteronephrosis.  There is mild surrounding edema probably related to pyelosinus extravasation. The right ureter is dilated all the way to the UVJ where there is a 3 mm stone.  The patient has had previous ileocecectomy.  I do not see any evidence of active Crohn's inflammation.  Uterus and adnexal regions appear unremarkable.  There is a moderate amount of gas and fecal matter in the colon.  IMPRESSION: 3 mm stone at the right UVJ with mild right hydroureteronephrosis and peri nephric edema on the  right consistent with pyelosinus extravasation.  No other urinary tract calculi are identified. This stone was not visible on the study of 09/17/2011 and presumably has developed since that time.  Previous ileocecectomy for Crohn's disease.  No evidence of active Crohn's change.  Low density in the medial segment of the left lobe of liver probably representing focal fatty change.   Original Report Authenticated By: Paulina Fusi, M.D.     GI Procedures: none  History/Physical Exam:  See Admission H&P  Admission HPI:  Robin Miller is a 22 year old female who was admitted on 05/26/2012 after abrupt onset of acute severe right flank and right lower quadrant pain early in the morning hours. This was associated with multiple episodes of nausea and vomiting. She was seen in the office and admitted with acute abdominal pain for further diagnostic evaluation. There initially was concern for possible partial bowel obstruction versus anastomotic complication as she had recently undergone and ileocecectomy for Crohn's disease refractory to steroids with development of ileal stricture and fistula.Marland Kitchen  Hospital Course by problem list: Patient was admitted to the hospital on 05/26/2012 with acute severe right flank and right lower cautery pain associated with multiple episodes of nausea and vomiting. She was admitted for IV fluid hydration pain control and diagnostic evaluation. She had CT scan of the abdomen and pelvis done on the day of admission which confirmed a right UPJ stone with associated hydronephrosis. She was seen in consultation by Dr. Laverle Patter for urology and was given about 24 hours to see if she could pass the stone on her own. On the afternoon of 05/27/2012  she was still having severe pain and nausea and was taken to the OR last evening for stone extraction and stent placement into the distal right ureter Patient feels much better today, as been able to tolerate by mouth's and will be discharged this afternoon to  home.  Discharge Vitals:  BP 97/62  Pulse 80  Temp 98.8 F (37.1 C) (Oral)  Resp 14  Ht 5\' 3"  (1.6 m)  Wt 125 lb (56.7 kg)  BMI 22.14 kg/m2  SpO2 99%  LMP 04/30/2012  Discharge Labs:  Results for orders placed during the hospital encounter of 05/26/12 (from the past 24 hour(s))  MRSA PCR SCREENING     Status: Normal   Collection Time   05/27/12  3:26 PM      Component Value Range   MRSA by PCR NEGATIVE  NEGATIVE    Disposition and follow-up:   Robin Miller was discharged from [facility] in stable condition.    Follow-up Appointments: Dr. Laverle Patter -10 days Sr Russella Dar- 3-4 weeks -call for appt   Discharge Medications:   Medication List     As of 05/28/2012 11:25 AM    STOP taking these medications         promethazine 12.5 MG tablet   Commonly known as: PHENERGAN      TAKE these medications         etonogestrel-ethinyl estradiol 0.12-0.015 MG/24HR vaginal ring   Commonly known as: NUVARING   Place 1 each vaginally every 28 (twenty-eight) days. Insert vaginally and leave in place for 3 consecutive weeks, then remove for 1 week.      mercaptopurine 50 MG tablet   Commonly known as: PURINETHOL   Take 1 tablet (50 mg total) by mouth daily. Give on an empty stomach 1 hour before or 2 hours after meals. Caution: Chemotherapy.      ondansetron 4 MG tablet   Commonly known as: ZOFRAN   Take 1 tablet (4 mg total) by mouth every 8 (eight) hours as needed for nausea.      Oxycodone HCl 10 MG Tabs   Take 1 tablet (10 mg total) by mouth 4 (four) times daily as needed. For pain.        Signed: Mike Gip 05/28/2012, 11:25 AM

## 2012-05-28 NOTE — Progress Notes (Signed)
Patient seen, examined, and I agree with the above documentation, including the assessment and plan. Follow-up with [name] at previously recommended interval for Crohn's

## 2012-05-28 NOTE — Progress Notes (Signed)
Patient ID: Robin Miller, female   DOB: [birth year], 22 y.o.   MRN: [id number]  1 Day Post-Op Subjective: Pt feeling better after ureteroscopic stone removal and stent placement last evening. She continues to have some nausea but no emesis.  Her pain is improved aside from expected stent symptoms include intermittent flank discomfort and suprapubic discomfort.  Objective: Vital signs in last 24 hours: Temp:  [97.7 F (36.5 C)-99 F (37.2 C)] 98.1 F (36.7 C) (01/31 0604) Pulse Rate:  [64-107] 90  (01/31 0604) Resp:  [12-16] 16  (01/31 0604) BP: (92-121)/(57-79) 110/76 mmHg (01/31 0604) SpO2:  [96 %-100 %] 97 % (01/31 0604)  Intake/Output from previous day: 01/30 0701 - 01/31 0700 In: 3166.7 [P.O.:240; I.V.:2926.7] Out: 1925 [Urine:1925] Intake/Output this shift:    Physical Exam:  General: Alert and oriented Abdomen: Soft, ND   Lab Results:  Basename 05/26/12 1743  HGB 11.5*  HCT 34.8*   BMET  Basename 05/26/12 1743  NA 136  K 4.0  CL 102  CO2 20  GLUCOSE 107*  BUN 10  CREATININE 0.80  CALCIUM 9.4      Assessment/Plan: Right UVJ calculus s/p ureteroscopic removal:  Robin Miller can be discharged today assuming she can tolerate her diet this morning and her pain is easily controlled with oral pain medication which is should be. I will arrange follow up for removal of her stent in 10-14 days and she has been counseled about expected symptoms related to her stent. She does have oxycodone at home and can use this prn for pain if necessary.   LOS: 2 days   Rickardo Brinegar,LES 05/28/2012, 7:08 AM

## 2012-05-28 NOTE — Discharge Summary (Signed)
Agree 

## 2012-06-12 ENCOUNTER — Other Ambulatory Visit: Payer: Self-pay

## 2012-07-05 ENCOUNTER — Other Ambulatory Visit (INDEPENDENT_AMBULATORY_CARE_PROVIDER_SITE_OTHER): Payer: BC Managed Care – PPO

## 2012-07-05 ENCOUNTER — Ambulatory Visit (INDEPENDENT_AMBULATORY_CARE_PROVIDER_SITE_OTHER): Payer: BC Managed Care – PPO | Admitting: Gastroenterology

## 2012-07-05 ENCOUNTER — Encounter: Payer: Self-pay | Admitting: Gastroenterology

## 2012-07-05 VITALS — BP 92/42 | HR 84 | Ht 64.5 in | Wt 123.0 lb

## 2012-07-05 DIAGNOSIS — K5080 Crohn's disease of both small and large intestine without complications: Secondary | ICD-10-CM

## 2012-07-05 LAB — COMPREHENSIVE METABOLIC PANEL
ALT: 6 U/L (ref 0–35)
AST: 13 U/L (ref 0–37)
Alkaline Phosphatase: 48 U/L (ref 39–117)
CO2: 25 mEq/L (ref 19–32)
Sodium: 140 mEq/L (ref 135–145)
Total Bilirubin: 0.5 mg/dL (ref 0.3–1.2)
Total Protein: 7.3 g/dL (ref 6.0–8.3)

## 2012-07-05 LAB — CBC WITH DIFFERENTIAL/PLATELET
Basophils Absolute: 0 10*3/uL (ref 0.0–0.1)
Eosinophils Absolute: 0.1 10*3/uL (ref 0.0–0.7)
HCT: 35.4 % — ABNORMAL LOW (ref 36.0–46.0)
Lymphs Abs: 1 10*3/uL (ref 0.7–4.0)
MCHC: 34.1 g/dL (ref 30.0–36.0)
Monocytes Absolute: 0.2 10*3/uL (ref 0.1–1.0)
Monocytes Relative: 3.8 % (ref 3.0–12.0)
Platelets: 447 10*3/uL — ABNORMAL HIGH (ref 150.0–400.0)
RDW: 19.7 % — ABNORMAL HIGH (ref 11.5–14.6)

## 2012-07-05 MED ORDER — BUDESONIDE 3 MG PO CP24: 9.0000 mg | ORAL_CAPSULE | ORAL | Status: DC

## 2012-07-05 NOTE — Patient Instructions (Addendum)
Your physician has requested that you go to the basement for the following lab work before leaving today: Thio purine metabolites, CMP, Lipase, CBC.  We have sent the following medications to your pharmacy for you to pick up at your convenience: Entocort.  Thank you for choosing me and [facility].  Venita Lick. Pleas Koch., MD., Clementeen Graham

## 2012-07-05 NOTE — Progress Notes (Signed)
History of Present Illness: This is a 22 year old female with a history of Crohn's ileocolitis status post ileocecal in November resection, started on 6-MP in early January. She was hospitalized for right lower quadrant pain related to a right UPJ stone which was treated by Dr. Lolita Cram. She has completed treatment in the stent has been removed. She notes episodes every several days of very mild right lower quadrant pain associated with diarrhea and occasional small amounts of bleeding. This is started about 3 weeks ago. In between these episodes she is completely asymptomatic and has normal bowel movements.  Current Medications, Allergies, Past Medical History, Past Surgical History, Family History and Social History were reviewed in [employer] record.  Physical Exam: General: Well developed , well nourished, no acute distress Head: Normocephalic and atraumatic Eyes:  sclerae anicteric, EOMI Ears: Normal auditory acuity Mouth: No deformity or lesions Lungs: Clear throughout to auscultation Heart: Regular rate and rhythm; no murmurs, rubs or bruits Abdomen: Soft, Minimal right lower quadrant tenderness to deep palpation without rebound or guarding and non distended. No masses, hepatosplenomegaly or hernias noted. Normal Bowel sounds Musculoskeletal: Symmetrical with no gross deformities  Pulses:  Normal pulses noted Extremities: No clubbing, cyanosis, edema or deformities noted Neurological: Alert oriented x 4, grossly nonfocal Psychological:  Alert and cooperative. Normal mood and affect  Assessment and Recommendations:  1. Crohn's ileocolitis, mild flare. Continue 6 MP 50 mg daily. Obtain standard blood work to monitor 6-MP and obtain 6-MP metabolite levels. Begin budesonide 9 mg daily. Of her metabolite levels are adequate and she does not respond promptly to budesonide we will need to reconsider biologic therapy. Return office visit in 4 weeks. Advised to call if her  symptoms worsen.

## 2012-07-13 LAB — THIOPURINE METABOLITES: 6-MMPN Metaboilte: 1554 pmol/8x 10E8

## 2012-07-19 ENCOUNTER — Other Ambulatory Visit: Payer: Self-pay | Admitting: *Deleted

## 2012-07-19 DIAGNOSIS — K5080 Crohn's disease of both small and large intestine without complications: Secondary | ICD-10-CM

## 2012-07-19 MED ORDER — MERCAPTOPURINE 50 MG PO TABS: 50.0000 mg | ORAL_TABLET | Freq: Every day | ORAL | Status: DC

## 2012-08-09 ENCOUNTER — Ambulatory Visit (INDEPENDENT_AMBULATORY_CARE_PROVIDER_SITE_OTHER): Payer: BC Managed Care – PPO | Admitting: Gastroenterology

## 2012-08-09 ENCOUNTER — Encounter: Payer: Self-pay | Admitting: Gastroenterology

## 2012-08-09 VITALS — BP 96/60 | HR 92 | Ht 64.0 in | Wt 120.0 lb

## 2012-08-09 DIAGNOSIS — R109 Unspecified abdominal pain: Secondary | ICD-10-CM

## 2012-08-09 DIAGNOSIS — K5080 Crohn's disease of both small and large intestine without complications: Secondary | ICD-10-CM

## 2012-08-09 MED ORDER — HYOSCYAMINE SULFATE 0.125 MG SL SUBL: 0.1250 mg | SUBLINGUAL_TABLET | SUBLINGUAL | Status: DC | PRN

## 2012-08-09 MED ORDER — TRAMADOL HCL 50 MG PO TABS: 50.0000 mg | ORAL_TABLET | Freq: Four times a day (QID) | ORAL | Status: DC

## 2012-08-09 NOTE — Patient Instructions (Addendum)
We have sent the following medications to your pharmacy for you to pick up at your convenience: Ultram and hyoscyamine.  You have been scheduled for a CT scan of the abdomen and pelvis at [facility] ([address]---this is in the same building as [facility]).   You are scheduled on 08/11/12 at 8:30am. You should arrive 15 minutes prior to your appointment time for registration. Please follow the written instructions below on the day of your exam:  WARNING: IF YOU ARE ALLERGIC TO IODINE/X-RAY DYE, PLEASE NOTIFY RADIOLOGY IMMEDIATELY AT [phone]! YOU WILL BE GIVEN A 13 HOUR PREMEDICATION PREP.  1) Do not eat or drink anything after 4:30am (4 hours prior to your test) 2) You have been given 2 bottles of oral contrast to drink. The solution may taste better if refrigerated, but do NOT add ice or any other liquid to this solution. Shake well before drinking.    Drink 1 bottle of contrast @ 6:30am (2 hours prior to your exam)  Drink 1 bottle of contrast @ 7:30am (1 hour prior to your exam)  You may take any medications as prescribed with a small amount of water except for the following: Metformin, Glucophage, Glucovance, Avandamet, Riomet, Fortamet, Actoplus Met, Janumet, Glumetza or Metaglip. The above medications must be held the day of the exam AND 48 hours after the exam.  The purpose of you drinking the oral contrast is to aid in the visualization of your intestinal tract. The contrast solution may cause some diarrhea. Before your exam is started, you will be given a small amount of fluid to drink. Depending on your individual set of symptoms, you may also receive an intravenous injection of x-ray contrast/dye. Plan on being at [facility] for 30 minutes or long, depending on the type of exam you are having performed.  This test typically takes 30-45 minutes to complete.  If you have any questions regarding your exam or if you need to reschedule, you may call the  CT department at [phone] between the hours of 8:00 am and 5:00 pm, Monday-Friday.  ________________________________________________________________________

## 2012-08-09 NOTE — Progress Notes (Signed)
History of Present Illness: This is a 22 year old female returning for followup of Crohn's ileocolitis status post a laparoscopically assisted ileocecectomy in November 2013. Despite starting budesonide one month ago she continues to have suprapubic and right lower quadrant abdominal pain. She states the pain is active about half the time. She occasionally improves following bowel movements. Her typical bowel pattern for the past several weeks is no bowel movements one day and 5-6 bowel movements the next day. Her appetite is good and her weight is stable. She notes no rectal bleeding or fevers. She was recently treated for kidney stone.  Current Medications, Allergies, Past Medical History, Past Surgical History, Family History and Social History were reviewed in [employer] record.  Physical Exam: General: Well developed , well nourished, no acute distress Head: Normocephalic and atraumatic Eyes:  sclerae anicteric, EOMI Ears: Normal auditory acuity Mouth: No deformity or lesions Lungs: Clear throughout to auscultation Heart: Regular rate and rhythm; no murmurs, rubs or bruits Abdomen: Soft, moderate suprapubic tenderness without rebound or guarding and non distended. No masses, hepatosplenomegaly or hernias noted. Normal Bowel sounds Musculoskeletal: Symmetrical with no gross deformities  Pulses:  Normal pulses noted Extremities: No clubbing, cyanosis, edema or deformities noted Neurological: Alert oriented x 4, grossly nonfocal Psychological:  Alert and cooperative. Normal mood and affect  Assessment and Recommendations:  1. Crohn's ileocolitis. Recurrent suprapubic and right lower quadrant pain associated with diarrhea. Rule out Crohn's activity adjacent to or associated with the anastomosis. 6-MP metabolites were above her therapeutic range. The addition of budesonide has not yet been helpful. Schedule CT of the abdomen/pelvis. Consider colonoscopy to reevaluate.  Continue 6-MP and budesonide 9 mg daily. She did not tolerate prednisone previously. Begin Ultram as needed and hyoscyamine as needed. We will need to reconsider biologic therapy. Return office visit in 4 weeks. Advised to call if her symptoms worsen.

## 2012-08-11 ENCOUNTER — Ambulatory Visit (INDEPENDENT_AMBULATORY_CARE_PROVIDER_SITE_OTHER)
Admission: RE | Admit: 2012-08-11 | Discharge: 2012-08-11 | Disposition: A | Discharge status: Home / Self Care | Payer: BC Managed Care – PPO | Source: Ambulatory Visit | Attending: Gastroenterology | Admitting: Gastroenterology

## 2012-08-11 DIAGNOSIS — R109 Unspecified abdominal pain: Secondary | ICD-10-CM

## 2012-08-11 DIAGNOSIS — K5080 Crohn's disease of both small and large intestine without complications: Secondary | ICD-10-CM

## 2012-08-11 MED ORDER — IOHEXOL 300 MG/ML  SOLN
80.0000 mL | Freq: Once | INTRAMUSCULAR | Status: AC | PRN
  Administered 2012-08-11: 80 mL via INTRAVENOUS

## 2012-08-16 ENCOUNTER — Encounter: Payer: Self-pay | Admitting: *Deleted

## 2012-08-16 ENCOUNTER — Other Ambulatory Visit: Payer: Self-pay

## 2012-08-16 DIAGNOSIS — K5080 Crohn's disease of both small and large intestine without complications: Secondary | ICD-10-CM

## 2012-08-16 MED ORDER — MERCAPTOPURINE 50 MG PO TABS: 50.0000 mg | ORAL_TABLET | Freq: Every day | ORAL | Status: DC

## 2012-09-13 ENCOUNTER — Encounter: Payer: Self-pay | Admitting: Gastroenterology

## 2012-09-13 ENCOUNTER — Other Ambulatory Visit (INDEPENDENT_AMBULATORY_CARE_PROVIDER_SITE_OTHER): Payer: BC Managed Care – PPO

## 2012-09-13 ENCOUNTER — Ambulatory Visit (INDEPENDENT_AMBULATORY_CARE_PROVIDER_SITE_OTHER): Payer: BC Managed Care – PPO | Admitting: Gastroenterology

## 2012-09-13 VITALS — BP 110/78 | HR 80 | Ht 63.0 in | Wt 116.4 lb

## 2012-09-13 DIAGNOSIS — K5080 Crohn's disease of both small and large intestine without complications: Secondary | ICD-10-CM

## 2012-09-13 LAB — CBC WITH DIFFERENTIAL/PLATELET
Basophils Relative: 0.7 % (ref 0.0–3.0)
Eosinophils Relative: 0.5 % (ref 0.0–5.0)
HCT: 37.3 % (ref 36.0–46.0)
Lymphs Abs: 0.9 10*3/uL (ref 0.7–4.0)
MCV: 94.4 fl (ref 78.0–100.0)
Monocytes Absolute: 0.3 10*3/uL (ref 0.1–1.0)
Monocytes Relative: 9.3 % (ref 3.0–12.0)
RBC: 3.95 Mil/uL (ref 3.87–5.11)
WBC: 3.4 10*3/uL — ABNORMAL LOW (ref 4.5–10.5)

## 2012-09-13 LAB — LIPASE: Lipase: 25 U/L (ref 11.0–59.0)

## 2012-09-13 LAB — COMPREHENSIVE METABOLIC PANEL
Albumin: 3.9 g/dL (ref 3.5–5.2)
Alkaline Phosphatase: 42 U/L (ref 39–117)
BUN: 7 mg/dL (ref 6–23)
CO2: 24 mEq/L (ref 19–32)
GFR: 120.24 mL/min (ref >60.00)
Glucose, Bld: 81 mg/dL (ref 70–99)
Potassium: 4.6 mEq/L (ref 3.5–5.1)
Total Bilirubin: 0.4 mg/dL (ref 0.3–1.2)

## 2012-09-13 MED ORDER — HYOSCYAMINE SULFATE 0.125 MG SL SUBL: SUBLINGUAL_TABLET | SUBLINGUAL | Status: DC

## 2012-09-13 NOTE — Patient Instructions (Addendum)
Your physician has requested that you go to the basement for the following lab work before leaving today: CBC, Cmet, and Lipase.  We have sent the following medications to your pharmacy for you to pick up at your convenience: Hyoscyamine increased to 1-2 tablets by mouth or under tongue every 4 hours with meals.  Thank you for choosing me and [facility].  Venita Lick. Pleas Koch., MD., Clementeen Graham

## 2012-09-13 NOTE — Progress Notes (Signed)
History of Present Illness: This is a 22 year old female returning for followup of Crohn's ileocolitis. She has persistent abdominal pain primarily in the suprapubic area but it generalizes frequently. It is exacerbated by meals. She has 1-4 loose bowel movements each day. A recent abdominal pelvic CT scan showed only for surgical changes. She notes a slight weight loss and slight decrease in appetite.  Current Medications, Allergies, Past Medical History, Past Surgical History, Family History and Social History were reviewed in [employer] record.  Physical Exam: General: Well developed , well nourished, no acute distress Head: Normocephalic and atraumatic Eyes:  sclerae anicteric, EOMI Ears: Normal auditory acuity Mouth: No deformity or lesions Lungs: Clear throughout to auscultation Heart: Regular rate and rhythm; no murmurs, rubs or bruits Abdomen: Soft, mild to moderate periumbilical and abdominal tenderness, right greater than left, no rebound or guarding and non distended. No masses, hepatosplenomegaly or hernias noted. Normal Bowel sounds Musculoskeletal: Symmetrical with no gross deformities  Pulses:  Normal pulses noted Extremities: No clubbing, cyanosis, edema or deformities noted Neurological: Alert oriented x 4, grossly nonfocal Psychological:  Alert and cooperative. Normal mood and affect  Assessment and Recommendations:  1. Crohn's ileocolitis. Recurrent abdominal pain associated with diarrhea. Rule out Crohn's activity. 6-MP metabolites were above her therapeutic range. The addition of budesonide has not yet been helpful. CT of the abdomen/pelvis did not reveal Crohn's activity. I recommended proceeding with colonoscopy to further evaluate but she would like to put this off for now to see if she will improve with continuing her current medications. Possible post surgical diarrhea however she had a symptom-free interval following surgery. Continue 6-MP and  budesonide 9 mg daily. We discussed beginning a biologic if colonoscopy shows Crohn's activity. She did not tolerate prednisone previously. Continue Ultram as needed and change hyoscyamine to 1-2 before meals and every 4 hours as needed. Blood work today. Return office visit in 4 weeks. Advised to call if her symptoms worsen

## 2012-10-18 ENCOUNTER — Ambulatory Visit: Payer: BC Managed Care – PPO | Admitting: Gastroenterology

## 2012-11-03 ENCOUNTER — Encounter: Payer: Self-pay | Admitting: Gastroenterology

## 2012-11-03 ENCOUNTER — Ambulatory Visit (INDEPENDENT_AMBULATORY_CARE_PROVIDER_SITE_OTHER): Payer: BC Managed Care – PPO | Admitting: Gastroenterology

## 2012-11-03 ENCOUNTER — Other Ambulatory Visit: Payer: BC Managed Care – PPO

## 2012-11-03 VITALS — BP 90/60 | HR 72 | Ht 63.0 in | Wt 108.4 lb

## 2012-11-03 DIAGNOSIS — R109 Unspecified abdominal pain: Secondary | ICD-10-CM

## 2012-11-03 DIAGNOSIS — K50118 Crohn's disease of large intestine with other complication: Secondary | ICD-10-CM

## 2012-11-03 DIAGNOSIS — K50818 Crohn's disease of both small and large intestine with other complication: Secondary | ICD-10-CM

## 2012-11-03 DIAGNOSIS — K5080 Crohn's disease of both small and large intestine without complications: Secondary | ICD-10-CM

## 2012-11-03 DIAGNOSIS — R634 Abnormal weight loss: Secondary | ICD-10-CM

## 2012-11-03 DIAGNOSIS — K5010 Crohn's disease of large intestine without complications: Secondary | ICD-10-CM

## 2012-11-03 MED ORDER — OXYCODONE HCL 5 MG PO TABS: 5.0000 mg | ORAL_TABLET | ORAL | Status: DC | PRN

## 2012-11-03 MED ORDER — ADALIMUMAB 40 MG/0.8ML ~~LOC~~ KIT: PACK | SUBCUTANEOUS | Status: DC

## 2012-11-03 MED ORDER — HUMIRA PEN-CROHNS STARTER 40 MG/0.8ML ~~LOC~~ KIT: PACK | SUBCUTANEOUS | Status: DC

## 2012-11-03 NOTE — Patient Instructions (Addendum)
You have been given a prescription for Oxycodone to take to your pharmacy.   You have been given a Humira information. We have sent the prescription also to your pharmacy. Contact Sheri once you have picked up the Humira starter kit from your pharmacy.   Your physician has requested that you go to the basement for the following lab work before leaving today: Hepatitis B surface antibody and a Hepatitis A total.   You have been given a TB skin test and Pneumovax vaccine. Please return in 48-72 hours to have your TB skin test read. If you do not return within that time frame then you will have to repeat the test.    Thank you for choosing me and [facility].  Venita Lick. Pleas Koch., MD., Clementeen Graham

## 2012-11-03 NOTE — Progress Notes (Signed)
History of Present Illness: This is a 22 year old female with Crohn's ileocolitis status post resection in 02/2012 who has ongoing lower abdominal pain and diarrhea. Her symptoms have slightly improved since her last office visit but she has daily pain and some days the pain is not controlled with Ultram. She has about 2 semi-formed bowel movements each day on good days but several days per week she has multiple episodes of diarrhea with increased abdominal pain. She has lost 8 pounds since her last office visit.   Current Medications, Allergies, Past Medical History, Past Surgical History, Family History and Social History were reviewed in [employer] record.  Physical Exam: General: Well developed , well nourished, no acute distress Head: Normocephalic and atraumatic Eyes:  sclerae anicteric, EOMI Ears: Normal auditory acuity Mouth: No deformity or lesions Lungs: Clear throughout to auscultation Heart: Regular rate and rhythm; no murmurs, rubs or bruits Abdomen: Soft, lower abdominal and periumbilical tenderness to moderate palpation without rebound or guarding and non distended. No masses, hepatosplenomegaly or hernias noted. Normal Bowel sounds Musculoskeletal: Symmetrical with no gross deformities  Pulses:  Normal pulses noted Extremities: No clubbing, cyanosis, edema or deformities noted Neurological: Alert oriented x 4, grossly nonfocal Psychological:  Alert and cooperative. Normal mood and affect  Assessment and Recommendations:  1. Crohn's ileocolitis with ongoing abdominal pain, diarrhea and weight loss. 6-MP and budesonide have not been effective in controlling her disease. I recommended proceeding with colonoscopy to further evaluate but she would like to put this off for now. We discussed beginning biologic therapy along with 6-mercaptopurine to help gain better control of her disease. She would like a stronger pain medication for days when her pain is more  severe and we will use oxycodone when her pain beyond the reach of Ultram as this has worked well in the past. She did not tolerate prednisone previously. She was like to discuss beginning biologic therapy with her father before starting. I strongly encouraged her to begin biologic therapy either with or without a colonoscopy. I recommended starting with standard dosing of Humira and we will see what is covered by her insurance plan. Update PPD, check vaccines status and confirm hepatitis A, B markers. Return office visit in 4 weeks. Advised to call if her symptoms worsen

## 2012-11-04 ENCOUNTER — Telehealth: Payer: Self-pay | Admitting: Gastroenterology

## 2012-11-04 DIAGNOSIS — Z23 Encounter for immunization: Secondary | ICD-10-CM

## 2012-11-04 NOTE — Telephone Encounter (Signed)
Left message for patient to call back  

## 2012-11-05 ENCOUNTER — Ambulatory Visit (INDEPENDENT_AMBULATORY_CARE_PROVIDER_SITE_OTHER): Payer: BC Managed Care – PPO | Admitting: Gastroenterology

## 2012-11-05 DIAGNOSIS — Z23 Encounter for immunization: Secondary | ICD-10-CM

## 2012-11-05 LAB — TB SKIN TEST: TB Skin Test: NEGATIVE

## 2012-11-05 NOTE — Telephone Encounter (Signed)
Patient would like to take Humira.  She also is aware she needs vaccination against Hep A.  She will come this am and get vaccine.  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

## 2012-11-22 ENCOUNTER — Telehealth: Payer: Self-pay | Admitting: Gastroenterology

## 2012-11-22 NOTE — Telephone Encounter (Signed)
Patient will pick up Humira from her specialty pharmacy on Wed.  She is scheduled for Humira teaching for Thursday 11/25/12 9:00

## 2012-12-09 ENCOUNTER — Telehealth: Payer: Self-pay

## 2012-12-09 MED ORDER — OXYCODONE HCL 5 MG PO TABS: 5.0000 mg | ORAL_TABLET | ORAL | Status: DC | PRN

## 2012-12-09 NOTE — Telephone Encounter (Signed)
Patient walked in today for her next Humira shots.  She does not feel she can inject herself.  I worked with her again this am and she was able to properly demonstrate the proper technique for self administration, but she does not feel she will be able to give herself an injection on her own.  She does not have anyone else who is able to help her with this.  I have advised her that I will enroll her in the Humira nurse program and I will get back to her once I hear from them.

## 2012-12-10 NOTE — Telephone Encounter (Signed)
I have left a message for the patient to call back and we can discuss [employer].

## 2012-12-13 NOTE — Telephone Encounter (Signed)
Left message for patient to call back  

## 2012-12-13 NOTE — Telephone Encounter (Signed)
Patient is not interested in the ambassador program at this time, but she does want to have the Humira nurse come out for teaching for her next injection.  I have enrolled her in the nursing services with [facility] and they will be faxing an order for [place] to sign.  She will call back if she decides she does want to use the [employer].

## 2012-12-14 NOTE — Telephone Encounter (Signed)
Referral form received and faxed.  Nesha aware

## 2012-12-23 ENCOUNTER — Telehealth: Payer: Self-pay | Admitting: Gastroenterology

## 2012-12-23 NOTE — Telephone Encounter (Signed)
Patient was given the syringe not the pens.  She will have the home health nurse inject the syringe today.  She will come here in 2 weeks and I will give her the 2nd syringe.  [facility] has arranged for the [facility] nurse to come back once she has the pens

## 2012-12-24 ENCOUNTER — Telehealth: Payer: Self-pay | Admitting: Gastroenterology

## 2012-12-24 NOTE — Telephone Encounter (Signed)
Order signed by Mike Gip PA

## 2013-01-05 ENCOUNTER — Telehealth: Payer: Self-pay | Admitting: Gastroenterology

## 2013-01-05 NOTE — Telephone Encounter (Signed)
Patient will come in tomorrow at 10:00 for me to inject her Cimzia

## 2013-01-06 NOTE — Progress Notes (Signed)
Patient ID: Robin Miller, female   DOB: 11/10/1990, 22 y.o.   MRN: [id number] Robin Miller came in today for her Humira injection.  She will get her next injection with the home health nurse.  She states for the last 2 weeks she has had increasing pain, occasional  vomiting, and diarrhea.  She is scheduled to see [name] next Tuesday.

## 2013-01-11 ENCOUNTER — Other Ambulatory Visit (INDEPENDENT_AMBULATORY_CARE_PROVIDER_SITE_OTHER): Payer: BC Managed Care – PPO

## 2013-01-11 ENCOUNTER — Ambulatory Visit (INDEPENDENT_AMBULATORY_CARE_PROVIDER_SITE_OTHER): Payer: BC Managed Care – PPO | Admitting: Gastroenterology

## 2013-01-11 ENCOUNTER — Encounter: Payer: Self-pay | Admitting: Gastroenterology

## 2013-01-11 VITALS — BP 102/70 | HR 68 | Ht 63.0 in | Wt 107.0 lb

## 2013-01-11 DIAGNOSIS — R109 Unspecified abdominal pain: Secondary | ICD-10-CM

## 2013-01-11 DIAGNOSIS — K5080 Crohn's disease of both small and large intestine without complications: Secondary | ICD-10-CM

## 2013-01-11 LAB — CBC WITH DIFFERENTIAL/PLATELET
Eosinophils Relative: 0.6 % (ref 0.0–5.0)
HCT: 38.5 % (ref 36.0–46.0)
Lymphocytes Relative: 33.8 % (ref 12.0–46.0)
Monocytes Relative: 5.9 % (ref 3.0–12.0)
Neutrophils Relative %: 59.2 % (ref 43.0–77.0)
Platelets: 208 10*3/uL (ref 150.0–400.0)
WBC: 4.1 10*3/uL — ABNORMAL LOW (ref 4.5–10.5)

## 2013-01-11 LAB — COMPREHENSIVE METABOLIC PANEL
CO2: 28 mEq/L (ref 19–32)
Calcium: 9.5 mg/dL (ref 8.4–10.5)
GFR: 129.01 mL/min (ref >60.00)
Glucose, Bld: 75 mg/dL (ref 70–99)
Sodium: 138 mEq/L (ref 135–145)
Total Bilirubin: 0.6 mg/dL (ref 0.3–1.2)
Total Protein: 7.6 g/dL (ref 6.0–8.3)

## 2013-01-11 NOTE — Progress Notes (Signed)
History of Present Illness: This is a 22 year-old female with Crohn's ileocolitis returning for followup. She's been treated with Humira for about 6 weeks. She remains on 6-MP. She has noted a slight improvement in symptoms but still has ongoing mild lower abdominal pain that is occasionally exacerbated by meals. Occasionally hyoscyamine is helpful in controlling mild pain but she has had to use narcotics for pain control on at least 2 occasions in the past 6 weeks. Her weight is stable and she states her appetite is fair. She feels somewhat limited in eating larger meals as it may lead to diarrhea and abdominal pain.   Current Medications, Allergies, Past Medical History, Past Surgical History, Family History and Social History were reviewed in [employer] record.  Physical Exam: General: Well developed , well nourished, no acute distress Head: Normocephalic and atraumatic Eyes:  sclerae anicteric, EOMI Ears: Normal auditory acuity Mouth: No deformity or lesions Lungs: Clear throughout to auscultation Heart: Regular rate and rhythm; no murmurs, rubs or bruits Abdomen: Soft, mild to moderate lower abdominal tenderness without rebound or guarding and non distended. No masses, hepatosplenomegaly or hernias noted. Normal Bowel sounds Musculoskeletal: Symmetrical with no gross deformities  Pulses:  Normal pulses noted Extremities: No clubbing, cyanosis, edema or deformities noted Neurological: Alert oriented x 4, grossly nonfocal Psychological:  Alert and cooperative. Normal mood and affect  Assessment and Recommendations:  1. Crohn's ileocolitis. Humira and . Symptoms may have slightly improved but she still is experiencing significant symptoms. Continue Humira and 6 MP. Obtain standard blood work today. Return office visit in 2 months.

## 2013-01-11 NOTE — Patient Instructions (Signed)
Your physician has requested that you go to the basement for the following lab work before leaving today: CBC, CMet and Lipase.  Thank you for choosing me and [facility].  Venita Lick. Pleas Koch., MD., Clementeen Graham

## 2013-01-17 ENCOUNTER — Other Ambulatory Visit: Payer: Self-pay | Admitting: *Deleted

## 2013-01-17 DIAGNOSIS — K5080 Crohn's disease of both small and large intestine without complications: Secondary | ICD-10-CM

## 2013-01-17 MED ORDER — MERCAPTOPURINE 50 MG PO TABS: 50.0000 mg | ORAL_TABLET | Freq: Every day | ORAL | Status: DC

## 2013-03-01 ENCOUNTER — Encounter: Payer: Self-pay | Admitting: Nurse Practitioner

## 2013-03-01 ENCOUNTER — Observation Stay (HOSPITAL_COMMUNITY): Payer: BC Managed Care – PPO

## 2013-03-01 ENCOUNTER — Inpatient Hospital Stay (HOSPITAL_COMMUNITY)
Admission: AD | Admit: 2013-03-01 | Discharge: 2013-03-11 | DRG: 418 | Disposition: A | Discharge status: Home / Self Care | Payer: BC Managed Care – PPO | Source: Ambulatory Visit | Attending: Gastroenterology | Admitting: Gastroenterology

## 2013-03-01 ENCOUNTER — Encounter (HOSPITAL_COMMUNITY): Payer: Self-pay | Admitting: Surgery

## 2013-03-01 ENCOUNTER — Telehealth: Payer: Self-pay | Admitting: Gastroenterology

## 2013-03-01 ENCOUNTER — Ambulatory Visit (INDEPENDENT_AMBULATORY_CARE_PROVIDER_SITE_OTHER): Payer: BC Managed Care – PPO | Admitting: Nurse Practitioner

## 2013-03-01 VITALS — BP 102/70 | HR 109 | Ht 63.0 in | Wt 119.0 lb

## 2013-03-01 DIAGNOSIS — Z803 Family history of malignant neoplasm of breast: Secondary | ICD-10-CM | POA: Diagnosis not present

## 2013-03-01 DIAGNOSIS — R52 Pain, unspecified: Secondary | ICD-10-CM | POA: Diagnosis present

## 2013-03-01 DIAGNOSIS — K5080 Crohn's disease of both small and large intestine without complications: Secondary | ICD-10-CM

## 2013-03-01 DIAGNOSIS — K209 Esophagitis, unspecified without bleeding: Secondary | ICD-10-CM | POA: Diagnosis present

## 2013-03-01 DIAGNOSIS — G8929 Other chronic pain: Secondary | ICD-10-CM | POA: Diagnosis present

## 2013-03-01 DIAGNOSIS — K449 Diaphragmatic hernia without obstruction or gangrene: Secondary | ICD-10-CM | POA: Diagnosis present

## 2013-03-01 DIAGNOSIS — K660 Peritoneal adhesions (postprocedural) (postinfection): Secondary | ICD-10-CM | POA: Diagnosis present

## 2013-03-01 DIAGNOSIS — K8020 Calculus of gallbladder without cholecystitis without obstruction: Secondary | ICD-10-CM | POA: Diagnosis present

## 2013-03-01 DIAGNOSIS — R630 Anorexia: Secondary | ICD-10-CM | POA: Diagnosis present

## 2013-03-01 DIAGNOSIS — F3289 Other specified depressive episodes: Secondary | ICD-10-CM | POA: Diagnosis present

## 2013-03-01 DIAGNOSIS — Z800 Family history of malignant neoplasm of digestive organs: Secondary | ICD-10-CM

## 2013-03-01 DIAGNOSIS — F329 Major depressive disorder, single episode, unspecified: Secondary | ICD-10-CM | POA: Diagnosis present

## 2013-03-01 DIAGNOSIS — R1033 Periumbilical pain: Secondary | ICD-10-CM

## 2013-03-01 DIAGNOSIS — Z9049 Acquired absence of other specified parts of digestive tract: Secondary | ICD-10-CM | POA: Diagnosis not present

## 2013-03-01 DIAGNOSIS — K5000 Crohn's disease of small intestine without complications: Secondary | ICD-10-CM

## 2013-03-01 DIAGNOSIS — R112 Nausea with vomiting, unspecified: Secondary | ICD-10-CM | POA: Diagnosis present

## 2013-03-01 DIAGNOSIS — K8010 Calculus of gallbladder with chronic cholecystitis without obstruction: Principal | ICD-10-CM | POA: Diagnosis present

## 2013-03-01 DIAGNOSIS — R197 Diarrhea, unspecified: Secondary | ICD-10-CM | POA: Diagnosis present

## 2013-03-01 DIAGNOSIS — Z87442 Personal history of urinary calculi: Secondary | ICD-10-CM

## 2013-03-01 DIAGNOSIS — Z85828 Personal history of other malignant neoplasm of skin: Secondary | ICD-10-CM | POA: Diagnosis not present

## 2013-03-01 DIAGNOSIS — R1115 Cyclical vomiting syndrome unrelated to migraine: Secondary | ICD-10-CM | POA: Diagnosis present

## 2013-03-01 DIAGNOSIS — K50013 Crohn's disease of small intestine with fistula: Secondary | ICD-10-CM

## 2013-03-01 DIAGNOSIS — Z823 Family history of stroke: Secondary | ICD-10-CM | POA: Diagnosis not present

## 2013-03-01 DIAGNOSIS — Z862 Personal history of diseases of the blood and blood-forming organs and certain disorders involving the immune mechanism: Secondary | ICD-10-CM

## 2013-03-01 DIAGNOSIS — E440 Moderate protein-calorie malnutrition: Secondary | ICD-10-CM | POA: Diagnosis present

## 2013-03-01 LAB — URINALYSIS, ROUTINE W REFLEX MICROSCOPIC
Bilirubin Urine: NEGATIVE
Glucose, UA: NEGATIVE mg/dL
Protein, ur: NEGATIVE mg/dL
Specific Gravity, Urine: 1.036 — ABNORMAL HIGH (ref 1.005–1.030)
pH: 5.5 (ref 5.0–8.0)

## 2013-03-01 LAB — COMPREHENSIVE METABOLIC PANEL
Albumin: 3.6 g/dL (ref 3.5–5.2)
Alkaline Phosphatase: 43 U/L (ref 39–117)
BUN: 9 mg/dL (ref 6–23)
Calcium: 9.6 mg/dL (ref 8.4–10.5)
GFR calc Af Amer: >90 mL/min (ref >90)
GFR calc non Af Amer: >90 mL/min (ref >90)
Glucose, Bld: 90 mg/dL (ref 70–99)
Potassium: 3.8 mEq/L (ref 3.5–5.1)
Sodium: 137 mEq/L (ref 135–145)
Total Protein: 7.4 g/dL (ref 6.0–8.3)

## 2013-03-01 LAB — CBC
HCT: 37.6 % (ref 36.0–46.0)
Hemoglobin: 12.6 g/dL (ref 12.0–15.0)
MCHC: 33.5 g/dL (ref 30.0–36.0)
MCV: 90.2 fL (ref 78.0–100.0)
RBC: 4.17 MIL/uL (ref 3.87–5.11)
RDW: 13.5 % (ref 11.5–15.5)
WBC: 5.8 10*3/uL (ref 4.0–10.5)

## 2013-03-01 LAB — URINE MICROSCOPIC-ADD ON

## 2013-03-01 MED ORDER — HEPARIN SODIUM (PORCINE) 5000 UNIT/ML IJ SOLN
5000.0000 [IU] | Freq: Three times a day (TID) | INTRAMUSCULAR | Status: DC
  Administered 2013-03-02 – 2013-03-06 (×11): 5000 [IU] via SUBCUTANEOUS
  Filled 2013-03-01 (×18): qty 1

## 2013-03-01 MED ORDER — HYDROMORPHONE HCL 2 MG PO TABS: 1.0000 mg | ORAL_TABLET | Freq: Once | ORAL | Status: AC | PRN

## 2013-03-01 MED ORDER — ONDANSETRON HCL 4 MG/2ML IJ SOLN
4.0000 mg | Freq: Four times a day (QID) | INTRAMUSCULAR | Status: DC | PRN
  Administered 2013-03-01 – 2013-03-02 (×2): 4 mg via INTRAVENOUS
  Filled 2013-03-01 (×2): qty 2

## 2013-03-01 MED ORDER — HYDROMORPHONE HCL PF 1 MG/ML IJ SOLN
1.0000 mg | INTRAMUSCULAR | Status: DC | PRN
  Administered 2013-03-01 – 2013-03-02 (×4): 1 mg via INTRAVENOUS
  Filled 2013-03-01 (×4): qty 1

## 2013-03-01 MED ORDER — MERCAPTOPURINE 50 MG PO TABS
50.0000 mg | ORAL_TABLET | Freq: Every day | ORAL | Status: DC
  Administered 2013-03-01 – 2013-03-08 (×7): 50 mg via ORAL
  Filled 2013-03-01 (×8): qty 1

## 2013-03-01 MED ORDER — ONDANSETRON HCL 4 MG PO TABS: 4.0000 mg | ORAL_TABLET | Freq: Four times a day (QID) | ORAL | Status: DC | PRN

## 2013-03-01 MED ORDER — POTASSIUM CHLORIDE IN NACL 20-0.9 MEQ/L-% IV SOLN
INTRAVENOUS | Status: DC
Start: 2013-03-01 — End: 2013-03-11
  Administered 2013-03-01 – 2013-03-11 (×16): via INTRAVENOUS
  Filled 2013-03-01 (×25): qty 1000

## 2013-03-01 NOTE — H&P (Signed)
Admission Note   Primary Care Physician: [name], MD  Primary Gastroenterologist: Erick Blinks, MD   HPI: Robin Miller is a 22 y.o. female followed by 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 She was intolerant to steroids and Entocort wasn't effective. Alternative meds (immunomodulators and biologics) discussed but patient ultimately decided on segmental resection. In November she underwent an ileocecectomy by Dr Michaell Cowing. Specimen pathology c/w ulceration /stricture / fistula . In early January we started her on 6mp. A few weeks later we admitted her to the hospital for acute abdominal pain which turned out to be a ureteral stone. In March patient was started on Entocort for mild crohn's flare. CTscan obtained and showed possible transverse colon thickening vrs underdistention. Colonoscopy was recommended, patient wanted to hold off. She was seen again in May with pain and diarrhea. She declined colonoscopy. Symptoms improved by time of follow up in July, Again we recommended colonoscopy, she declined. We recommended starting biologics which she started in early August. Since being on Humira diarrhea and abdominal pain have been getting better. At 3am patient woke up with nausea, vomiting, abdominal pain. Later this morning she developed diarrhea. She has had several episodes of vomiting today. Abdominal pain is constant, dull with frequent sharp pains. Pain similar to when she had kidney stones minus flank pain. No chills. No sick contacts. No recent antibiotics.   Past Medical History   Diagnosis  Date   .  Depression    .  Vasovagal syncope    .  Anemia    .  Cancer      basal cell back and face   .  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  Crohn's ileocolitis  11/04/2011    Past Surgical History   Procedure  Laterality  Date   .  Tonsillectomy and adenoidectomy     .  Skin cancer excision     .  Ileocetomy    03/16/2012   .  Laparoscopic ileocecectomy   03/16/2012     Procedure: LAPAROSCOPIC ILEOCECECTOMY; Surgeon: [name], MD; Location: [facility] OR; Service: General; Laterality: N/A; lap assisted ileocectomy   .  Holmium laser application   05/27/2012     Procedure: HOLMIUM LASER APPLICATION; Surgeon: Crecencio Mc, MD; Location: [facility] ORS; Service: Urology; Laterality: Right;   .  Cystoscopy with retrograde pyelogram, ureteroscopy and stent placement   05/27/2012     Procedure: CYSTOSCOPY WITH RETROGRADE PYELOGRAM, URETEROSCOPY AND STENT PLACEMENT; Surgeon: Crecencio Mc, MD; Location: [facility] ORS; Service: Urology; Laterality: Right;    Prior to Admission medications   Medication  Sig  Start Date  End Date  Taking?  Authorizing Provider   adalimumab (HUMIRA PEN) 40 MG/0.8ML injection  Inject 40 mg (1 pen) subcutaneous every 14 days following the completion of the starter kit.  12/04/12   Yes  [name], MD   etonogestrel-ethinyl estradiol (NUVARING) 0.12-0.015 MG/24HR vaginal ring  Place 1 each vaginally every 28 (twenty-eight) days. Insert vaginally and leave in place for 3 consecutive weeks, then remove for 1 week.    Yes  Historical Provider, MD   HUMIRA PEN STARTER 40 MG/0.8ML injection  Inject subcutaneous 160mg  on day one, then inject subcutaneous 80 mg on day 15  11/03/12   Yes  [name], MD   hyoscyamine (LEVSIN SL) 0.125 MG SL tablet  Take 1-2 tablets by mouth every 4 hours before meals as  needed  09/13/12   Yes  [name], MD   mercaptopurine (PURINETHOL) 50 MG tablet  Take 1 tablet (50 mg total) by mouth daily. Give on an empty stomach 1 hour before or 2 hours after meals. Caution: Chemotherapy.  01/17/13   Yes  [name], MD   oxyCODONE (OXY IR/ROXICODONE) 5 MG immediate release tablet  Take 1-2 tablets (5-10 mg total) by mouth every 4 (four) hours as needed for pain.  12/09/12   Yes  [name], MD   traMADol (ULTRAM) 50 MG tablet  Take 1 tablet (50 mg total) by mouth 4 (four)  times daily.  08/09/12   Yes  [name], MD    Current Outpatient Prescriptions   Medication  Sig  Dispense  Refill   .  adalimumab (HUMIRA PEN) 40 MG/0.8ML injection  Inject 40 mg (1 pen) subcutaneous every 14 days following the completion of the starter kit.  2 each  3   .  etonogestrel-ethinyl estradiol (NUVARING) 0.12-0.015 MG/24HR vaginal ring  Place 1 each vaginally every 28 (twenty-eight) days. Insert vaginally and leave in place for 3 consecutive weeks, then remove for 1 week.     Marland Kitchen  HUMIRA PEN STARTER 40 MG/0.8ML injection  Inject subcutaneous 160mg  on day one, then inject subcutaneous 80 mg on day 15  1 each  0   .  hyoscyamine (LEVSIN SL) 0.125 MG SL tablet  Take 1-2 tablets by mouth every 4 hours before meals as needed  100 tablet  11   .  mercaptopurine (PURINETHOL) 50 MG tablet  Take 1 tablet (50 mg total) by mouth daily. Give on an empty stomach 1 hour before or 2 hours after meals. Caution: Chemotherapy.  30 tablet  1   .  oxyCODONE (OXY IR/ROXICODONE) 5 MG immediate release tablet  Take 1-2 tablets (5-10 mg total) by mouth every 4 (four) hours as needed for pain.  30 tablet  0   .  traMADol (ULTRAM) 50 MG tablet  Take 1 tablet (50 mg total) by mouth 4 (four) times daily.  120 tablet  1    No current facility-administered medications for this visit.    Allergies as of 03/01/2013 - Review Complete 03/01/2013   Allergen  Reaction  Noted   .  Prednisone   11/04/2011    Family History   Problem  Relation  Age of Onset   .  Stroke  Mother    .  Breast cancer  Mother    .  Breast cancer  Paternal Aunt    .  Crohn's disease  Father    .  Colitis  Maternal Aunt    .  Colon cancer  Paternal Grandmother      ? cancer, part of colon removed    History    Social History   .  Marital Status:  Single     Spouse Name:  N/A     Number of Children:  0   .  Years of Education:  N/A    Occupational History   .  student     Social History Main Topics   .  Smoking status:   Never Smoker   .  Smokeless tobacco:  Never Used   .  Alcohol Use:  No   .  Drug Use:  No   .  Sexual Activity:  Yes     Birth Control/ Protection:  Inserts    Studies: Colonoscopy July 2013  FINDINGS: There were mucosal changes consistent with Crohn's disease at the ileocecal valve. They were ulcerated, friable and nodular with IC valve deformity. Unable to enter the TI. Two areas adjacent to the IC valve that may have been fistulae were noted, one possibly was a stenotic distal TI. Multiple biopsies were obtained and sent to pathology. Otherwise normal colonoscopy without other polyps, masses, vascular ectasias, or inflammatory  changes. Retroflexed views in the rectum revealed no  abnormalities.  Pathology= chronic active ileocolitis   Review of Systems:  All systems reviewed an negative except where noted in HPI.   Physical Exam:  General: Pleasant, well-developed, white female in NAD  Head: Normocephalic and atraumatic.  Eyes: Sclera clear, no icterus. Conjunctiva pink.  Ears: Normal auditory acuity.  Mouth: No deformity or lesions.  Neck: Supple; no masses .  Lungs: Clear throughout to auscultation. No wheezes, crackles, or rhonchi. No acute distress.  Heart: Regular rhythm, slightly tachy  Abdomen: Soft, nondistended, significant diffuse tenderness, hypoactive bowel sounds. .  Msk: Symmetrical without gross deformities..  Pulses: Normal pulses noted.  Extremities: Without edema.  Neurologic: Alert and oriented x4; grossly normal neurologically.  Skin: Intact without significant lesions or rashes.  Cervical Nodes: No significant cervical adenopathy.  Psych: Alert and cooperative. Normal mood and affect.   Impression / Plan:   22 year old female with history of fistulizing Crohn's ileocolitis diagnosed July 2013. She is s/p ileocecectomy November 2013. Patient had been improving on 6mp (started January 2014) and Humira (started early August 2104). Now with acute diffuse  abdominal pain, nausea, vomiting, diarrhea. Rule out Crohn's flare. Rule out recurrent kidney stone. Patient needs CTscan but I don't think she can tolerate the contrast. I think she needs 23 hour observation for IV fluids, symptom management, labs, stool studies and plain films of the abdomen to rule out partial obstruction. Patient will be admitted to [facility]. She will likely need CTscan tomorrow. Unfortunately patient is intolerant to steroids. I have spoken with our inpatient GI team at [facility].   Willette Cluster 03/01/2013, 3:53 PM

## 2013-03-01 NOTE — Telephone Encounter (Signed)
Patient reports terrible pain, diarrhea, and vomiting today.  She will come in and see Willette Cluster RNP at 3:00

## 2013-03-01 NOTE — H&P (Signed)
Attending physician's note   I have taken a history, examined the patient and reviewed the chart. I agree with the Advanced Practitioner's note, impression and recommendations. R/O Crohn's flare, partial SBO, infectious gastroenteritis.  [name], MD Clementeen Graham

## 2013-03-01 NOTE — Progress Notes (Signed)
Admission Note  Primary Care Physician:  [name], MD Primary Gastroenterologist:    Erick Blinks, MD  HPI: Robin Miller is a 22 y.o. female followed by 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 She was intolerant to steroids and Entocort wasn't effective. Alternative meds (immunomodulators and biologics) discussed but patient ultimately decided on segmental resection. In November she underwent an ileocecectomy by Dr Michaell Cowing. Specimen pathology c/w ulceration /stricture / fistula . In early January we started her on 6mp. A few weeks later we admitted her to the hospital for acute abdominal pain which turned out to be a ureteral stone. In March patient was started on Entocort for mild crohn's flare. CTscan obtained and showed possible transverse colon thickening vrs underdistention.  Colonoscopy was recommended, patient wanted to hold off. She was seen again in May with pain and diarrhea. She declined colonoscopy. Symptoms improved by time of follow up in July, Again we recommended colonoscopy, she declined. We recommended starting biologics which she started in early August. Since being on Humira diarrhea and abdominal pain have been getting better. At 3am patient woke up with nausea, vomiting, abdominal pain. Later this morning she developed diarrhea. She has had several episodes of vomiting today. Abdominal pain is constant, dull with frequent sharp pains. Pain similar to when she had kidney stones minus flank pain.  No chills. No sick contacts. No recent antibiotics.   Past Medical History  Diagnosis Date  . Depression   . Vasovagal syncope   . Anemia   . Nausea vomiting and diarrhea   . Cancer     basal cell back and face  . 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 Crohn's ileocolitis 11/04/2011    Past Surgical History  Procedure Laterality Date  . Tonsillectomy and adenoidectomy    . Skin cancer excision    .  Ileocetomy  03/16/2012  . Laparoscopic ileocecectomy  03/16/2012    Procedure: LAPAROSCOPIC ILEOCECECTOMY;  Surgeon: [name], MD;  Location: [facility] OR;  Service: General;  Laterality: N/A;  lap assisted ileocectomy  . Holmium laser application  05/27/2012    Procedure: HOLMIUM LASER APPLICATION;  Surgeon: Crecencio Mc, MD;  Location: [facility] ORS;  Service: Urology;  Laterality: Right;     . Cystoscopy with retrograde pyelogram, ureteroscopy and stent placement  05/27/2012    Procedure: CYSTOSCOPY WITH RETROGRADE PYELOGRAM, URETEROSCOPY AND STENT PLACEMENT;  Surgeon: Crecencio Mc, MD;  Location: [facility] ORS;  Service: Urology;  Laterality: Right;    Prior to Admission medications   Medication Sig Start Date End Date Taking? Authorizing Provider  adalimumab (HUMIRA PEN) 40 MG/0.8ML injection Inject 40 mg (1 pen) subcutaneous every 14 days following the completion of the starter kit. 12/04/12  Yes [name], MD  etonogestrel-ethinyl estradiol (NUVARING) 0.12-0.015 MG/24HR vaginal ring Place 1 each vaginally every 28 (twenty-eight) days. Insert vaginally and leave in place for 3 consecutive weeks, then remove for 1 week.    Yes Historical Provider, MD  HUMIRA PEN STARTER 40 MG/0.8ML injection Inject subcutaneous 160mg  on day one, then inject subcutaneous 80 mg on day 15 11/03/12  Yes [name], MD  hyoscyamine (LEVSIN SL) 0.125 MG SL tablet Take 1-2 tablets by mouth every 4 hours before meals as needed 09/13/12  Yes [name], MD  mercaptopurine (PURINETHOL) 50 MG tablet Take 1 tablet (50 mg total) by mouth daily. Give on an empty stomach 1 hour before  or 2 hours after meals. Caution: Chemotherapy. 01/17/13  Yes [name], MD  oxyCODONE (OXY IR/ROXICODONE) 5 MG immediate release tablet Take 1-2 tablets (5-10 mg total) by mouth every 4 (four) hours as needed for pain. 12/09/12  Yes [name], MD  traMADol (ULTRAM) 50 MG tablet Take 1 tablet (50 mg total) by mouth 4 (four) times daily. 08/09/12   Yes [name], MD    Current Outpatient Prescriptions  Medication Sig Dispense Refill  . adalimumab (HUMIRA PEN) 40 MG/0.8ML injection Inject 40 mg (1 pen) subcutaneous every 14 days following the completion of the starter kit.  2 each  3  . etonogestrel-ethinyl estradiol (NUVARING) 0.12-0.015 MG/24HR vaginal ring Place 1 each vaginally every 28 (twenty-eight) days. Insert vaginally and leave in place for 3 consecutive weeks, then remove for 1 week.       Marland Kitchen HUMIRA PEN STARTER 40 MG/0.8ML injection Inject subcutaneous 160mg  on day one, then inject subcutaneous 80 mg on day 15  1 each  0  . hyoscyamine (LEVSIN SL) 0.125 MG SL tablet Take 1-2 tablets by mouth every 4 hours before meals as needed  100 tablet  11  . mercaptopurine (PURINETHOL) 50 MG tablet Take 1 tablet (50 mg total) by mouth daily. Give on an empty stomach 1 hour before or 2 hours after meals. Caution: Chemotherapy.  30 tablet  1  . oxyCODONE (OXY IR/ROXICODONE) 5 MG immediate release tablet Take 1-2 tablets (5-10 mg total) by mouth every 4 (four) hours as needed for pain.  30 tablet  0  . traMADol (ULTRAM) 50 MG tablet Take 1 tablet (50 mg total) by mouth 4 (four) times daily.  120 tablet  1   No current facility-administered medications for this visit.    Allergies as of 03/01/2013 - Review Complete 03/01/2013  Allergen Reaction Noted  . Prednisone  11/04/2011    Family History  Problem Relation Age of Onset  . Stroke Mother   . Breast cancer Mother   . Breast cancer Paternal Aunt   . Crohn's disease Father   . Colitis Maternal Aunt   . Colon cancer Paternal Grandmother     ? cancer, part of colon removed    History   Social History  . Marital Status: Single    Spouse Name: N/A    Number of Children: 0  . Years of Education: N/A   Occupational History  . student    Social History Main Topics  . Smoking status: Never Smoker   . Smokeless tobacco: Never Used  . Alcohol Use: No  . Drug Use: No  .  Sexual Activity: Yes    Birth Control/ Protection: Inserts   Colonoscopy July 2013 FINDINGS: There were mucosal changes consistent with Crohn's disease at the ileocecal valve. They were ulcerated, friable and nodular with IC valve deformity. Unable to enter the TI. Two areas adjacent to the IC valve that may have been fistulae were noted, one possibly was a stenotic distal TI. Multiple biopsies were obtained and sent to pathology. Otherwise normal colonoscopy without other polyps, masses, vascular ectasias, or inflammatory  changes. Retroflexed views in the rectum revealed no  abnormalities.   Pathology= chronic active ileocolitis  Review of Systems:  All systems reviewed an negative except where noted in HPI.  Physical Exam: General:  Pleasant, well-developed, white female in NAD Head:  Normocephalic and atraumatic. Eyes:  Sclera clear, no icterus.   Conjunctiva pink. Ears:  Normal auditory acuity. Mouth:  No  deformity or lesions.  Neck:  Supple; no masses . Lungs:  Clear throughout to auscultation.   No wheezes, crackles, or rhonchi. No acute distress. Heart:  Regular rhythm, slightly tachy Abdomen:  Soft, nondistended, significant diffuse tenderness, hypoactive bowel sounds.  .    Msk:  Symmetrical without gross deformities.. Pulses:  Normal pulses noted. Extremities:  Without edema. Neurologic:  Alert and  oriented x4;  grossly normal neurologically. Skin:  Intact without significant lesions or rashes. Cervical Nodes:  No significant cervical adenopathy. Psych:  Alert and cooperative. Normal mood and affect.   Impression / Plan:  22 year old female with history of fistulizing Crohn's ileocolitis diagnosed July 2013. She is s/p ileocecectomy November 2013. Patient had been improving on 6mp (started January 2014) and Humira (started early August 2104). Now with acute diffuse abdominal pain, nausea, vomiting diarrhea. Rule out Crohn's flare. Rule out recurrent kidney stone. Patient  needs CTscan but I don't think she can hold down contrast. She is at risk for dehydration. I think she needs 23 hours observation for IV fluids, symptom management, labs, stool studies and plain films. She will be admitted to [facility]. She will likely need CTscan tomorrow. Unfortunately patient is intolerant to steroids. I have spoken with our inpatient GI team at [facility].     Willette Cluster  03/01/2013, 3:53 PM

## 2013-03-01 NOTE — Progress Notes (Signed)
Reviewed and agree with management plan.  Rolondo Pierre T. Mashonda Broski, MD FACG 

## 2013-03-02 ENCOUNTER — Inpatient Hospital Stay (HOSPITAL_COMMUNITY): Payer: BC Managed Care – PPO

## 2013-03-02 ENCOUNTER — Encounter (HOSPITAL_COMMUNITY): Payer: Self-pay | Admitting: Radiology

## 2013-03-02 DIAGNOSIS — R1033 Periumbilical pain: Secondary | ICD-10-CM

## 2013-03-02 DIAGNOSIS — R112 Nausea with vomiting, unspecified: Secondary | ICD-10-CM

## 2013-03-02 MED ORDER — ADULT MULTIVITAMIN W/MINERALS CH
1.0000 | ORAL_TABLET | Freq: Every day | ORAL | Status: DC
  Administered 2013-03-03 – 2013-03-11 (×7): 1 via ORAL
  Filled 2013-03-02 (×10): qty 1

## 2013-03-02 MED ORDER — HYDROMORPHONE HCL PF 1 MG/ML IJ SOLN
1.0000 mg | INTRAMUSCULAR | Status: DC | PRN
  Administered 2013-03-02 – 2013-03-11 (×55): 1 mg via INTRAVENOUS
  Filled 2013-03-02 (×57): qty 1

## 2013-03-02 MED ORDER — IOHEXOL 300 MG/ML  SOLN
100.0000 mL | Freq: Once | INTRAMUSCULAR | Status: AC | PRN
  Administered 2013-03-02: 100 mL via INTRAVENOUS

## 2013-03-02 MED ORDER — IOHEXOL 300 MG/ML  SOLN
25.0000 mL | Freq: Once | INTRAMUSCULAR | Status: AC | PRN
  Administered 2013-03-02 (×2): 25 mL via ORAL

## 2013-03-02 MED ORDER — PROMETHAZINE HCL 25 MG/ML IJ SOLN
25.0000 mg | Freq: Four times a day (QID) | INTRAMUSCULAR | Status: DC | PRN
  Administered 2013-03-02 – 2013-03-05 (×11): 25 mg via INTRAVENOUS
  Filled 2013-03-02 (×12): qty 1

## 2013-03-02 MED ORDER — METOCLOPRAMIDE HCL 5 MG/ML IJ SOLN
5.0000 mg | Freq: Four times a day (QID) | INTRAMUSCULAR | Status: DC | PRN
  Administered 2013-03-02 – 2013-03-03 (×6): 5 mg via INTRAVENOUS
  Administered 2013-03-04: 08:00:00 via INTRAVENOUS
  Filled 2013-03-02 (×7): qty 2

## 2013-03-02 MED ORDER — BOOST / RESOURCE BREEZE PO LIQD
1.0000 | Freq: Three times a day (TID) | ORAL | Status: DC
  Administered 2013-03-02 – 2013-03-11 (×22): 1 via ORAL

## 2013-03-02 NOTE — Progress Notes (Addendum)
INITIAL NUTRITION ASSESSMENT  DOCUMENTATION CODES Per approved criteria  -Not Applicable   INTERVENTION: - Diet advancement per MD - Resource Breeze TID - Multivitamin 1 tablet PO daily - Educated pt on importance of staying hydrated when having diarrhea and encouraged adequate protein intake once diet advanced to prevent loss of lean body mass - Will continue to monitor   NUTRITION DIAGNOSIS: Inadequate oral intake related to clear liquid diet as evidenced by diet order.   Goal: 1. No further nausea/vomiting/diarrhea 2. Advance diet as tolerated to regular diet  Monitor:  Weights, labs, diet advancement, nausea/vomiting/diarrhea  Reason for Assessment: Nutrition risk   22 y.o. female  Admitting Dx: Acute diffuse abdominal pain, nausea, vomiting, diarrhea  ASSESSMENT: Pt with hx of fistulizing Crohn's ileocolitis and ileocecectomy. Pt admitted with acute diffuse abdominal pain, nausea, vomiting, and diarrhea that started yesterday. Pt reports emesis was bile and her diarrhea was loose and dark green. Did not eat anything yesterday. Denies any vomiting or diarrhea today, only dry heaving. Typically eats 2 meals and 2 snacks/day and reports her meals are well balanced. Takes daily multivitamin. Reports 20 pound unintended weight loss in the past 2 months r/t having diarrhea 3 days out of the week which occurs in episodes lasting for a few hours. States she has been trying to stay hydrated. Thinks she gained 1 pound back recently. Past weight trend shows pt's weight up 12 pounds in the past 2 months. Performed nutrition focused physical exam. Pt c/o occasionally feeling weak after having episodes of diarrhea. States she feels like she has lost a moderate amount of muscle mass in her upper arms, evident by exam.   Nutrition Focused Physical Exam:  Subcutaneous Fat:  Orbital Region: WNL Upper Arm Region: mild/moderate muscle wasting Thoracic and Lumbar Region: WNL  Muscle:  Temple  Region: WNL Clavicle Bone Region: WNL Clavicle and Acromion Bone Region: WNL Scapular Bone Region: NA Dorsal Hand: WNL Patellar Region: WNL Anterior Thigh Region: WNL Posterior Calf Region: WNL  Edema: None noted   Height: Ht Readings from Last 1 Encounters:  03/01/13 5\' 3"  (1.6 m)    Weight: Wt Readings from Last 1 Encounters:  03/01/13 119 lb (53.978 kg)    Ideal Body Weight: 115 lb   % Ideal Body Weight: 103%  Wt Readings from Last 10 Encounters:  03/01/13 119 lb (53.978 kg)  03/01/13 119 lb (53.978 kg)  01/11/13 107 lb (48.535 kg)  11/03/12 108 lb 6 oz (49.159 kg)  09/13/12 116 lb 6.4 oz (52.799 kg)  08/09/12 120 lb (54.432 kg)  07/05/12 123 lb (55.792 kg)  05/26/12 125 lb (56.7 kg)  05/26/12 125 lb (56.7 kg)  05/26/12 127 lb (57.607 kg)    Usual Body Weight: 139 lb 2 months ago per pt  % Usual Body Weight: 86%  BMI:  Body mass index is 21.09 kg/(m^2).  Estimated Nutritional Needs: Kcal: 1350-1550 Protein: 55-65g Fluid: 1.3-1.5L/day  Skin: Intact  Diet Order: Clear Liquid  EDUCATION NEEDS: -No education needs identified at this time   Intake/Output Summary (Last 24 hours) at 03/02/13 1014 Last data filed at 03/02/13 0600  Gross per 24 hour  Intake 1313.33 ml  Output    200 ml  Net 1113.33 ml    Last BM: 11/4, diarrhea  Labs:   Recent Labs Lab 03/01/13 1738  NA 137  K 3.8  CL 103  CO2 22  BUN 9  CREATININE 0.74  CALCIUM 9.6  GLUCOSE 90    CBG (  last 3)  No results found for this basename: GLUCAP,  in the last 72 hours  Scheduled Meds: . heparin  5,000 Units Subcutaneous Q8H  . mercaptopurine  50 mg Oral Daily    Continuous Infusions: . 0.9 % NaCl with KCl 20 mEq / L 100 mL/hr at 03/02/13 0449    Past Medical History  Diagnosis Date  . Depression   . Vasovagal syncope   . Anemia   . Nausea vomiting and diarrhea   . Cancer     basal cell back and face  . 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 Crohn's ileocolitis 11/04/2011    Past Surgical History  Procedure Laterality Date  . Tonsillectomy and adenoidectomy    . Skin cancer excision    . Ileocetomy  03/16/2012  . Laparoscopic ileocecectomy  03/16/2012    Procedure: LAPAROSCOPIC ILEOCECECTOMY;  Surgeon: [name], MD;  Location: [facility] OR;  Service: General;  Laterality: N/A;  lap assisted ileocectomy  . Holmium laser application  05/27/2012    Procedure: HOLMIUM LASER APPLICATION;  Surgeon: Crecencio Mc, MD;  Location: [facility] ORS;  Service: Urology;  Laterality: Right;     . Cystoscopy with retrograde pyelogram, ureteroscopy and stent placement  05/27/2012    Procedure: CYSTOSCOPY WITH RETROGRADE PYELOGRAM, URETEROSCOPY AND STENT PLACEMENT;  Surgeon: Crecencio Mc, MD;  Location: [facility] ORS;  Service: Urology;  Laterality: Right;    Levon Hedger MS, RD, LDN [phone] Pager [phone] After Hours Pager

## 2013-03-02 NOTE — Progress Notes (Signed)
Pt vomited, unrelieved by zofran. Pt requests phenergan. Dr. Marina Goodell was paged and orders received. Eugene Garnet RN

## 2013-03-02 NOTE — Progress Notes (Signed)
Utilization review completed.  

## 2013-03-02 NOTE — Progress Notes (Signed)
Patient seen, examined, and I agree with the above documentation, including the assessment and plan. Agree with CT scan to try and tease out what is driving her symptoms.  Concern is Crohn's flare, though her symptoms were quite abrupt and in the past she had renal stones which confused the picture a bit.  She is still having significant N/V and we will change anti-emetics to reglan (from zofran) and continue with phenergan. Zofran not helping presently.   May need colonoscopy if diagnosis remains in question Supportive care

## 2013-03-02 NOTE — Progress Notes (Signed)
[facility] Progress Note  Subjective:  Still having dry heaves.  Says that the zofran does not help but the phenergan helps some.  Has periumbilical abdominal pain.  No BM since admission.  Objective:  Vital signs in last 24 hours: Temp:  [97.7 F (36.5 C)-98.7 F (37.1 C)] 98.5 F (36.9 C) (11/05 0650) Pulse Rate:  [77-113] 77 (11/05 0650) Resp:  [16-20] 20 (11/05 0650) BP: (95-116)/(56-84) 95/56 mmHg (11/05 0650) SpO2:  [98 %-99 %] 98 % (11/05 0650) Weight:  [119 lb (53.978 kg)] 119 lb (53.978 kg) (11/04 1630) Last BM Date: 03/01/13 General:  Alert, Well-developed, in NAD Heart:  Regular rate and rhythm; no murmurs Pulm:  CTAB.  No W/R/R. Abdomen:  Soft, non-distended.  BS present.  TTP in the periumbilical region and lower abdomen. Extremities:  Without edema. Neurologic:  Alert and  oriented x4;  grossly normal neurologically. Psych:  Alert and cooperative. Normal mood and affect.  Intake/Output from previous day: 11/04 0701 - 11/05 0700 In: 1313.3 [P.O.:240; I.V.:1073.3] Out: 200 [Urine:200]  Lab Results:  Recent Labs  03/01/13 1738  WBC 5.8  HGB 12.6  HCT 37.6  PLT 231   BMET  Recent Labs  03/01/13 1738  NA 137  K 3.8  CL 103  CO2 22  GLUCOSE 90  BUN 9  CREATININE 0.74  CALCIUM 9.6   LFT  Recent Labs  03/01/13 1738  PROT 7.4  ALBUMIN 3.6  AST 13  ALT 7  ALKPHOS 43  BILITOT 0.2*   Acute Abdominal Series  03/01/2013   CLINICAL DATA:  Diffuse abdominal pain. Nausea. Vomiting. History of Crohn's disease. History of renal calculus on the right.  EXAM: ACUTE ABDOMEN SERIES (ABDOMEN 2 VIEW & CHEST 1 VIEW)  COMPARISON:  CT 08/11/2012.  Chest 07/30/2007.  FINDINGS: Cardiac silhouette is normal size shape. No mediastinal or hilar lesions are seen. Mediastinal and hilar contours appear stable. Lungs are free of infiltrates. No masses are seen. No pleural disease is evident. There is thoracic scoliosis convexity to the right.  No  pneumoperitoneum is evident. Bowel gas is nonspecific and within limits of normal. No opaque calculi are seen. There is lumbar scoliosis convexity to the left. There is moderate fecal distention of colon in the pelvis.  IMPRESSION: No acute or active cardiopulmonary or pleural abnormalities are seen. Thoracolumbar scoliosis is present.  No pneumoperitoneum is evident. Bowel gas is nonspecific and within limits of normal. No opaque calculi are seen. There is moderate fecal distention of colon in the pelvis.   Electronically Signed   By: Onalee Hua  Call M.D.   On: 03/01/2013 17:14    Assessment / Plan: -22 year old female with history of fistulizing Crohn's ileocolitis diagnosed July 2013. She is s/p ileocecectomy November 2013. Patient had been improving on 6mp (started January 2014) and Humira (started early August 2104). Now with acute diffuse abdominal pain, nausea, vomiting, diarrhea. Rule out Crohn's flare vs PSBO vs recurrent kidney stone.  We will order CT scan abdomen and pelvis with contrast today.  We did consider CT enterography, however, this required her to drink more volume of contrast, which we did not think that she could tolerate.  Will discontinue Zofran.  Continue phenergan IV every 6 hours prn and will add reglan 5 mg IV every 6 hours prn to be alternated.  Will increase dilaudid to every 2 hours prn.  BMP in AM.    LOS: 1 day   Lavontay Kirk D.  03/02/2013, 8:53 AM  Pager number [phone]

## 2013-03-03 ENCOUNTER — Other Ambulatory Visit: Payer: Self-pay

## 2013-03-03 ENCOUNTER — Inpatient Hospital Stay (HOSPITAL_COMMUNITY): Payer: BC Managed Care – PPO

## 2013-03-03 LAB — URINE CULTURE: Colony Count: NO GROWTH

## 2013-03-03 LAB — BASIC METABOLIC PANEL
BUN: 7 mg/dL (ref 6–23)
CO2: 18 mEq/L — ABNORMAL LOW (ref 19–32)
Calcium: 8.5 mg/dL (ref 8.4–10.5)
Chloride: 109 mEq/L (ref 96–112)
Creatinine, Ser: 0.6 mg/dL (ref 0.50–1.10)
Potassium: 3.7 mEq/L (ref 3.5–5.1)
Sodium: 138 mEq/L (ref 135–145)

## 2013-03-03 NOTE — Progress Notes (Signed)
Patient ID: Robin Miller, female   DOB: [birth year], 22 y.o.   MRN: [id number]  Gastroenterology Progress Note  Subjective: Still very nauseated,keeping some sips of boost down. No Bm or diarrhea since Tuesday .Abdomen sore especially around umbilicus,intermittent sharp pains  cdiff never collected- no stools Ct abd/pelvis 11/5- normal except mildly distended GB- no inflammatory changes  Objective:  Vital signs in last 24 hours: Temp:  [98 F (36.7 C)-98.7 F (37.1 C)] 98 F (36.7 C) (11/06 1143) Pulse Rate:  [88-98] 98 (11/06 1143) Resp:  [16-18] 18 (11/06 1143) BP: (96-112)/(64-70) 112/66 mmHg (11/06 1143) SpO2:  [97 %-99 %] 99 % (11/06 1143) Last BM Date: 03/01/13 General:   Alert,  Well-developed,thin young WF     in NAD Heart: tachy Regular rate and rhythm; no murmurs Pulm;clear Abdomen:  Soft, tender and nondistended. Normal bowel sounds, rather diffuse upper abdominal tenderness with guarding, no rebound  Extremities:  Without edema. Neurologic:  Alert and  oriented x4;  grossly normal neurologically. Psych:  Alert and cooperative. Normal mood and affect.  Intake/Output from previous day: 11/05 0701 - 11/06 0700 In: 3533.3 [P.O.:1160; I.V.:2373.3] Out: 800 [Urine:800] Intake/Output this shift:    Lab Results:  Recent Labs  03/01/13 1738  WBC 5.8  HGB 12.6  HCT 37.6  PLT 231   BMET  Recent Labs  03/01/13 1738 03/03/13 0334  NA 137 138  K 3.8 3.7  CL 103 109  CO2 22 18*  GLUCOSE 90 102*  BUN 9 7  CREATININE 0.74 0.60  CALCIUM 9.6 8.5   LFT  Recent Labs  03/01/13 1738  PROT 7.4  ALBUMIN 3.6  AST 13  ALT 7  ALKPHOS 43  BILITOT 0.2*   PT/INR No results found for this basename: LABPROT, INR,  in the last 72 hours   Assessment / Plan: #1  22 yo female with hx Crohns ileocolitis s/p resection -with acute abdominal pain, n/v,mild transirnt diarrhea Workup negative thus far, no evidence for ureterolithiasis,obstruction or active  Crohns ? 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

## 2013-03-03 NOTE — Progress Notes (Signed)
Patient seen, examined, and I agree with the above documentation, including the assessment and plan. Unclear cause of GI symptoms, pain/n/v. CT unrevealing and no evidence of active IBD  Agree with [place] to r/o gallbladder inflammation

## 2013-03-04 ENCOUNTER — Encounter (HOSPITAL_COMMUNITY)
Admission: AD | Disposition: A | Discharge status: Home / Self Care | Payer: Self-pay | Source: Ambulatory Visit | Attending: Gastroenterology

## 2013-03-04 ENCOUNTER — Encounter (HOSPITAL_COMMUNITY): Payer: Self-pay | Admitting: *Deleted

## 2013-03-04 ENCOUNTER — Inpatient Hospital Stay (HOSPITAL_COMMUNITY): Payer: BC Managed Care – PPO

## 2013-03-04 DIAGNOSIS — R112 Nausea with vomiting, unspecified: Secondary | ICD-10-CM | POA: Diagnosis not present

## 2013-03-04 DIAGNOSIS — K5090 Crohn's disease, unspecified, without complications: Secondary | ICD-10-CM

## 2013-03-04 DIAGNOSIS — K8010 Calculus of gallbladder with chronic cholecystitis without obstruction: Secondary | ICD-10-CM | POA: Diagnosis not present

## 2013-03-04 DIAGNOSIS — R1011 Right upper quadrant pain: Secondary | ICD-10-CM

## 2013-03-04 HISTORY — PX: ESOPHAGOGASTRODUODENOSCOPY: SHX5428

## 2013-03-04 LAB — HEPATIC FUNCTION PANEL
AST: 10 U/L (ref 0–37)
Alkaline Phosphatase: 45 U/L (ref 39–117)
Bilirubin, Direct: <0.1 mg/dL (ref 0.0–0.3)
Total Bilirubin: 0.3 mg/dL (ref 0.3–1.2)

## 2013-03-04 LAB — C-REACTIVE PROTEIN: CRP: 1.8 mg/dL — ABNORMAL HIGH (ref <0.60)

## 2013-03-04 LAB — CBC
MCH: 30.2 pg (ref 26.0–34.0)
MCHC: 33.2 g/dL (ref 30.0–36.0)
Platelets: 197 10*3/uL (ref 150–400)
RBC: 3.88 MIL/uL (ref 3.87–5.11)

## 2013-03-04 SURGERY — EGD (ESOPHAGOGASTRODUODENOSCOPY)
Anesthesia: Moderate Sedation

## 2013-03-04 MED ORDER — PSYLLIUM 95 % PO PACK
1.0000 | PACK | Freq: Two times a day (BID) | ORAL | Status: DC
  Administered 2013-03-04 – 2013-03-05 (×3): 1 via ORAL
  Filled 2013-03-04 (×5): qty 1

## 2013-03-04 MED ORDER — OXYCODONE HCL 5 MG PO TABS
5.0000 mg | ORAL_TABLET | ORAL | Status: DC | PRN
  Administered 2013-03-04 – 2013-03-08 (×8): 5 mg via ORAL
  Filled 2013-03-04 (×8): qty 1

## 2013-03-04 MED ORDER — PANTOPRAZOLE SODIUM 40 MG PO TBEC
40.0000 mg | DELAYED_RELEASE_TABLET | Freq: Every day | ORAL | Status: DC
  Administered 2013-03-05 – 2013-03-11 (×6): 40 mg via ORAL
  Filled 2013-03-04 (×11): qty 1

## 2013-03-04 MED ORDER — FENTANYL CITRATE 0.05 MG/ML IJ SOLN
INTRAMUSCULAR | Status: AC
  Filled 2013-03-04: qty 2

## 2013-03-04 MED ORDER — FENTANYL CITRATE 0.05 MG/ML IJ SOLN
INTRAMUSCULAR | Status: DC | PRN
  Administered 2013-03-04 (×2): 25 ug via INTRAVENOUS

## 2013-03-04 MED ORDER — MIDAZOLAM HCL 10 MG/2ML IJ SOLN
INTRAMUSCULAR | Status: DC | PRN
  Administered 2013-03-04: 2 mg via INTRAVENOUS
  Administered 2013-03-04: 1 mg via INTRAVENOUS
  Administered 2013-03-04: 2 mg via INTRAVENOUS
  Administered 2013-03-04 (×3): 1 mg via INTRAVENOUS

## 2013-03-04 MED ORDER — TECHNETIUM TC 99M MEBROFENIN IV KIT
5.5000 | PACK | Freq: Once | INTRAVENOUS | Status: AC | PRN
  Administered 2013-03-04: 6 via INTRAVENOUS

## 2013-03-04 MED ORDER — MAGIC MOUTHWASH
15.0000 mL | Freq: Four times a day (QID) | ORAL | Status: DC | PRN
  Filled 2013-03-04: qty 15

## 2013-03-04 MED ORDER — BUTAMBEN-TETRACAINE-BENZOCAINE 2-2-14 % EX AERO
INHALATION_SPRAY | CUTANEOUS | Status: DC | PRN
  Administered 2013-03-04: 2 via TOPICAL

## 2013-03-04 MED ORDER — PROMETHAZINE HCL 25 MG PO TABS
25.0000 mg | ORAL_TABLET | Freq: Four times a day (QID) | ORAL | Status: DC | PRN
  Administered 2013-03-04 – 2013-03-05 (×3): 25 mg via ORAL
  Filled 2013-03-04 (×4): qty 1

## 2013-03-04 MED ORDER — MIDAZOLAM HCL 10 MG/2ML IJ SOLN
INTRAMUSCULAR | Status: AC
  Filled 2013-03-04: qty 2

## 2013-03-04 MED ORDER — SACCHAROMYCES BOULARDII 250 MG PO CAPS
250.0000 mg | ORAL_CAPSULE | Freq: Two times a day (BID) | ORAL | Status: DC
  Administered 2013-03-04 – 2013-03-11 (×13): 250 mg via ORAL
  Filled 2013-03-04 (×15): qty 1

## 2013-03-04 MED ORDER — LIP MEDEX EX OINT
1.0000 "application " | TOPICAL_OINTMENT | Freq: Two times a day (BID) | CUTANEOUS | Status: DC
  Administered 2013-03-04 – 2013-03-11 (×13): 1 via TOPICAL
  Filled 2013-03-04 (×3): qty 7

## 2013-03-04 MED ORDER — ALUM & MAG HYDROXIDE-SIMETH 200-200-20 MG/5ML PO SUSP: 30.0000 mL | Freq: Four times a day (QID) | ORAL | Status: DC | PRN

## 2013-03-04 MED ORDER — PANTOPRAZOLE SODIUM 40 MG IV SOLR
40.0000 mg | Freq: Once | INTRAVENOUS | Status: AC
  Administered 2013-03-04: 40 mg via INTRAVENOUS
  Filled 2013-03-04: qty 40

## 2013-03-04 NOTE — Progress Notes (Signed)
Patient ID: MALAI LADY, female   DOB: [birth year], 22 y.o.   MRN: [id number] [facility] Progress Note  Subjective: Pain continues,constant across upper abdomen-now feeling more into her back-vomited last night. Using pain meds regularly  Abd [place]- multiple layering stones,  slightly distended GB but no wall thickening  Objective:  Vital signs in last 24 hours: Temp:  [98 F (36.7 C)-98.9 F (37.2 C)] 98.9 F (37.2 C) (11/07 0600) Pulse Rate:  [85-98] 88 (11/07 0600) Resp:  [16-18] 18 (11/07 0600) BP: (102-115)/(66-75) 113/72 mmHg (11/07 0600) SpO2:  [99 %-100 %] 100 % (11/07 0600) Last BM Date: 03/01/13 General:   Alert,  Well-developed,  WF  in NAD Heart:  Regular rate and rhythm; no murmurs Pulm;clear Abdomen:  Soft, tender epigastrium and across upper abdomen and nondistended. Normal bowel sounds,+  guarding,  Extremities:  Without edema. Neurologic:  Alert and  oriented x4;  grossly normal neurologically. Psych:  Alert and cooperative. Normal mood and affect.  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

## 2013-03-04 NOTE — Interval H&P Note (Signed)
History and Physical Interval Note: Persistent mid abdominal and right mid to upper abdominal pain requiring IV pain medications and antiemetic medications Ultrasound showed layering gallstones but no thickening. HIDA scan was normal without evidence for cholecystitis Unclear cause of her pain, nausea and vomiting. Proceeding to upper endoscopy for further evaluation though this also may be low yield The nature of the procedure, as well as the risks, benefits, and alternatives were carefully and thoroughly reviewed with the patient. Ample time for discussion and questions allowed. The patient understood, was satisfied, and agreed to proceed.      03/04/2013 3:50 PM  Nino Glow  has presented today for surgery, with the diagnosis of epigastric pain, N/V  The various methods of treatment have been discussed with the patient and family. After consideration of risks, benefits and other options for treatment, the patient has consented to  Procedure(s): ESOPHAGOGASTRODUODENOSCOPY (EGD) (N/A) as a surgical intervention .  The patient's history has been reviewed, patient examined, no change in status, stable for surgery.  I have reviewed the patient's chart and labs.  Questions were answered to the patient's satisfaction.     Robin Miller

## 2013-03-04 NOTE — Consult Note (Addendum)
HIDA scan, negative.  She has pain on the right side where she has her ileocolonic anastomosis.  I wonder if this is a Crohn's flare instead, despite the CT scan being underwhelming  EGD somewhat underwhelming, but agree with PPI.   Complete GI workup. Consider colonoscopy.  This seems like she is overdue for this.  Would be helpful to make sure that disease is well controlled.  .  Consider gastric emptying study to make sure that is not contributing to things - I ordered for AM but pt afraid/confused to do it so help off & see what GI thinks  Fiber bowel regimen.  Treat symptoms / prob Crohn's flare.  If colonoscopy negative and rest of workup negative, could reconsider biliary etiology - but I am skeptical for now

## 2013-03-04 NOTE — Consult Note (Signed)
Robin Miller 1991/02/14  [id number].    Requesting MD: Mike Gip Chief Complaint/Reason for Consult: cholelithiasis ? cholecystitis HPI:  22 y.o. 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 She was intolerant to steroids and Entocort wasn't effective.  Alternative meds (immunomodulators and biologics) discussed but patient ultimately decided on segmental resection. In November she underwent an laparoscopic ileocecectomy by Dr Michaell Cowing. Specimen pathology c/w ulceration /stricture / fistula . In early January we started her on 6mp. A few weeks later we admitted her to the hospital for acute abdominal pain which turned out to be a ureteral stone. In March patient was started on Entocort for mild crohn's flare. CT scan obtained and showed possible transverse colon thickening vs underdistention. Colonoscopy was recommended, patient wanted to hold off. She was seen again in May with pain and diarrhea. She declined colonoscopy. Symptoms improved by time of follow up in July, Again we recommended colonoscopy, she declined. We recommended starting biologics which she started in early August. Since being on Humira diarrhea and abdominal pain have been getting better. At 3am on 03/01/13 the patient woke up with nausea, vomiting, abdominal pain. Then later developed diarrhea and had several episodes of vomiting. Abdominal pain was a constant, dull with frequent sharp pains. She was in her normal state of health going to school and working prior to Tuesday.  GI consulted [place] regarding evaluation of gallbladder as a potential cause of her pain.  She is being treated for an acute crohns flair.  She has an US showing multiple laying stones and slightly distended GB but no wall thickening.  CT scan shows the same.  HIDA scan pending.  LFT's and WBC are normal.  She continues to be symptomatic with N/V, anorexia, and RUQ pain/right flank pain.  No fever, chills, CP/SOB, new changes in bowel/bladder  problems.  Food does not precipitate symptoms.  No alleviating factors.  No sick contacts or traveling.     ROS: All systems reviewed and otherwise negative except for as above  Family History  Problem Relation Age of Onset  . Stroke Mother   . Breast cancer Mother   . Breast cancer Paternal Aunt   . Crohn's disease Father   . Colitis Maternal Aunt   . Colon cancer Paternal Grandmother     ? cancer, part of colon removed    Past Medical History  Diagnosis Date  . Depression   . Vasovagal syncope   . Anemia   . Nausea vomiting and diarrhea   . Cancer     basal cell back and face  . 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 Crohn's ileocolitis 11/04/2011    Past Surgical History  Procedure Laterality Date  . Tonsillectomy and adenoidectomy    . Skin cancer excision    . Ileocetomy  03/16/2012  . Laparoscopic ileocecectomy  03/16/2012    Procedure: LAPAROSCOPIC ILEOCECECTOMY;  Surgeon: [name], MD;  Location: [facility] OR;  Service: General;  Laterality: N/A;  lap assisted ileocectomy  . Holmium laser application  05/27/2012    Procedure: HOLMIUM LASER APPLICATION;  Surgeon: Crecencio Mc, MD;  Location: [facility] ORS;  Service: Urology;  Laterality: Right;     . Cystoscopy with retrograde pyelogram, ureteroscopy and stent placement  05/27/2012    Procedure: CYSTOSCOPY WITH RETROGRADE PYELOGRAM, URETEROSCOPY AND STENT PLACEMENT;  Surgeon: Crecencio Mc, MD;  Location: [facility] OR[facility];  Service: Urology;  Laterality: Right;    Social  History:  reports that she has never smoked. She has never used smokeless tobacco. She reports that she does not drink alcohol or use illicit drugs.  Allergies:  Allergies  Allergen Reactions  . Prednisone     Blurred vision, ringing in ears, insomnia, shaky, body convulsions,nausea    Medications Prior to Admission  Medication Sig Dispense Refill  . adalimumab (HUMIRA PEN) 40 MG/0.8ML injection Inject 40  mg (1 pen) subcutaneous every 14 days following the completion of the starter kit.  2 each  3  . etonogestrel-ethinyl estradiol (NUVARING) 0.12-0.015 MG/24HR vaginal ring Place 1 each vaginally every 28 (twenty-eight) days. Insert vaginally and leave in place for 3 consecutive weeks, then remove for 1 week.       . hyoscyamine (LEVSIN SL) 0.125 MG SL tablet Take 1-2 tablets by mouth every 4 hours before meals as needed  100 tablet  11  . mercaptopurine (PURINETHOL) 50 MG tablet Take 1 tablet (50 mg total) by mouth daily. Give on an empty stomach 1 hour before or 2 hours after meals. Caution: Chemotherapy.  30 tablet  1  . Multiple Vitamin (MULTIVITAMIN WITH MINERALS) TABS tablet Take 1 tablet by mouth daily.      Marland Kitchen oxyCODONE (OXY IR/ROXICODONE) 5 MG immediate release tablet Take 1-2 tablets (5-10 mg total) by mouth every 4 (four) hours as needed for pain.  30 tablet  0  . traMADol (ULTRAM) 50 MG tablet Take 1 tablet (50 mg total) by mouth 4 (four) times daily.  120 tablet  1    Blood pressure 113/72, pulse 88, temperature 98.9 F (37.2 C), temperature source Oral, resp. rate 18, height 5\' 3"  (1.6 m), weight 119 lb (53.978 kg), last menstrual period 01/29/2013, SpO2 100.00%. Physical Exam: General: pleasant, WD/WN white female who is laying in bed in NAD HEENT: head is normocephalic, atraumatic.  Sclera are noninjected.  PERRL.  Ears and nose without any masses or lesions.  Mouth is pink and moist Heart: regular, rate, and rhythm.  No obvious murmurs, gallops, or rubs noted.  Palpable pedal pulses bilaterally Lungs: CTAB, no wheezes, rhonchi, or rales noted.  Respiratory effort nonlabored Abd: soft, moderate tenderness in the RUQ, +BS, no masses, hernias, or organomegaly, laparoscopic scars at the umbilicus and one in RLQ and LLQ well healed MS: all 4 extremities are symmetrical with no cyanosis, clubbing, or edema. Skin: warm and dry with no masses, lesions, or rashes Psych: A&Ox3 with an  appropriate affect.  Results for orders placed during the hospital encounter of 03/01/13 (from the past 48 hour(s))  BASIC METABOLIC PANEL     Status: Abnormal   Collection Time    03/03/13  3:34 AM      Result Value Range   Sodium 138  135 - 145 mEq/L   Potassium 3.7  3.5 - 5.1 mEq/L   Chloride 109  96 - 112 mEq/L   CO2 18 (*) 19 - 32 mEq/L   Glucose, Bld 102 (*) 70 - 99 mg/dL   BUN 7  6 - 23 mg/dL   Creatinine, Ser 2.53  0.50 - 1.10 mg/dL   Calcium 8.5  8.4 - 66.4 mg/dL   GFR calc non Af Amer >90  >90 mL/min   GFR calc Af Amer >90  >90 mL/min   Comment: (NOTE)     The eGFR has been calculated using the CKD EPI equation.     This calculation has not been validated in all clinical situations.  eGFR's persistently <90 mL/min signify possible Chronic Kidney     Disease.  LIPASE, BLOOD     Status: None   Collection Time    03/04/13  5:34 AM      Result Value Range   Lipase 33  11 - 59 U/L  HEPATIC FUNCTION PANEL     Status: Abnormal   Collection Time    03/04/13  5:34 AM      Result Value Range   Total Protein 6.8  6.0 - 8.3 g/dL   Albumin 3.0 (*) 3.5 - 5.2 g/dL   AST 10  0 - 37 U/L   ALT 5  0 - 35 U/L   Alkaline Phosphatase 45  39 - 117 U/L   Total Bilirubin 0.3  0.3 - 1.2 mg/dL   Bilirubin, Direct <1.6  0.0 - 0.3 mg/dL   Indirect Bilirubin NOT CALCULATED  0.3 - 0.9 mg/dL  CBC     Status: Abnormal   Collection Time    03/04/13  5:34 AM      Result Value Range   WBC 4.8  4.0 - 10.5 K/uL   RBC 3.88  3.87 - 5.11 MIL/uL   Hemoglobin 11.7 (*) 12.0 - 15.0 g/dL   HCT 10.9 (*) 60.4 - 54.0 %   MCV 90.7  78.0 - 100.0 fL   MCH 30.2  26.0 - 34.0 pg   MCHC 33.2  30.0 - 36.0 g/dL   RDW 98.1  19.1 - 47.8 %   Platelets 197  150 - 400 K/uL   US Abdomen Complete  03/03/2013   CLINICAL DATA:  22-year- female with distended gallbladder on CT. Abdominal pain. History of Crohn disease. Initial encounter.  EXAM: ULTRASOUND ABDOMEN COMPLETE  COMPARISON:  CT Abdomen and Pelvis  03/02/2013 and earlier.  FINDINGS: Gallbladder  Layering gallstones within the gallbladder which is mildly distended. Gallbladder wall thickness remains normal at 1 to 2 mm. Multiple small stones, individually approximately 5 mm diameter. No sonographic Murphy sign elicited.  Common bile duct  Diameter: 4 mm, normal.  Liver  No focal lesion identified. Within normal limits in parenchymal echogenicity.  IVC  No abnormality visualized.  Pancreas  Visualized portion unremarkable.  Spleen  Size and appearance within normal limits.  Right Kidney  Length: 10.8 cm. Echogenicity within normal limits. No mass or hydronephrosis visualized.  Left Kidney  Length: 11.1 cm. Echogenicity within normal limits. No mass or hydronephrosis visualized.  Abdominal aorta  No aneurysm visualized.  IMPRESSION: Cholelithiasis, without evidence of acute cholecystitis.   Electronically Signed   By: Augusto Gamble M.D.   On: 03/03/2013 20:45   Ct Abdomen Pelvis W Contrast  03/02/2013   CLINICAL DATA:  Patient with abdominal pain. History of Crohn's disease as well as right-sided renal stone.  EXAM: CT ABDOMEN AND PELVIS WITH CONTRAST  TECHNIQUE: Multidetector CT imaging of the abdomen and pelvis was performed using the standard protocol following bolus administration of intravenous contrast.  CONTRAST:  OMNIPAQUE IOHEXOL 300 MG/ML  SOLN  COMPARISON:  CT 08/11/2012.  FINDINGS: Limited visualization of the lung bases demonstrates minimal dependent atelectasis within the left lower lobe. No pleural effusion. Normal heart size.  Liver is normal in size and contour. Focal fatty deposition adjacent to the falciform ligament. Portal vein is patent. Gallbladder is mildly distended. No intrahepatic or extrahepatic biliary ductal dilatation. The spleen and pancreas are unremarkable. Normal bilateral adrenal glands. Kidneys enhance symmetrically with contrast. No hydronephrosis.  Normal caliber abdominal aorta. No  retroperitoneal lymphadenopathy.  Urinary bladder is unremarkable. Uterus and bilateral adnexa are unremarkable.  Postsurgical changes within the right lower quadrant. No abnormal bowel wall thickening. No evidence for bowel obstruction. No free fluid or free intraperitoneal air.  No aggressive appearing osseous lesions.  IMPRESSION: 1. No acute abnormality or explanation for right lower quadrant pain. 2. Surgical changes within the region of the terminal ileum without evidence for active Crohn's disease. 3. Mildly distended gallbladder without surrounding inflammatory change.   Electronically Signed   By: Annia Belt M.D.   On: 03/02/2013 14:43       Assessment/Plan H/o fistulizing Crohn's ileocolitis s/p laparoscopic ileocecectomy 02/2012 (Dr. Michaell Cowing) RUQ abdominal pain Nausea/vomiting Cholelithiasis without evidence of cholecystitis  Plan: 1.  Pending HIDA scan at 12:00pm today 2.  Depending on results of HIDA we can make treatment recommendations.  If positive will need to go to OR for lap chole, if negative may need further GI workup including EGD etc. 3.  Ambulate and IS 4.  SCD's and on heparin currently   DORT, Nick Armel 03/04/2013, 9:55 AM Pager: [phone]

## 2013-03-04 NOTE — H&P (View-Only) (Signed)
Patient ID: Robin Miller, female   DOB: 07/09/1990, 22 y.o.   MRN: [id number] [facility] Progress Note  Subjective: Pain continues,constant across upper abdomen-now feeling more into her back-vomited last night. Using pain meds regularly  Abd US- multiple layering stones,  slightly distended GB but no wall thickening  Objective:  Vital signs in last 24 hours: Temp:  [98 F (36.7 C)-98.9 F (37.2 C)] 98.9 F (37.2 C) (11/07 0600) Pulse Rate:  [85-98] 88 (11/07 0600) Resp:  [16-18] 18 (11/07 0600) BP: (102-115)/(66-75) 113/72 mmHg (11/07 0600) SpO2:  [99 %-100 %] 100 % (11/07 0600) Last BM Date: 03/01/13 General:   Alert,  Well-developed,  WF  in NAD Heart:  Regular rate and rhythm; no murmurs Pulm;clear Abdomen:  Soft, tender epigastrium and across upper abdomen and nondistended. Normal bowel sounds,+  guarding,  Extremities:  Without edema. Neurologic:  Alert and  oriented x4;  grossly normal neurologically. Psych:  Alert and cooperative. Normal mood and affect.  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    

## 2013-03-04 NOTE — Op Note (Signed)
[facility] [address] [place], [address]   ENDOSCOPY PROCEDURE REPORT  PATIENT: Chanah, Tidmore  MR#: [id number] BIRTHDATE: 1990/11/24 , 22  yrs. old GENDER: Female ENDOSCOPIST: Beverley Fiedler, MD PROCEDURE DATE:  03/04/2013 PROCEDURE:  EGD w/ biopsy for H.pylori ASA CLASS:     Class II INDICATIONS:  Nausea.   Vomiting.   periumbilical abdominal pain. MEDICATIONS: These medications were titrated to patient response per physician's verbal order, Fentanyl 75 mcg IV, and Versed 8 mg IV TOPICAL ANESTHETIC: Cetacaine Spray  DESCRIPTION OF PROCEDURE: After the risks benefits and alternatives of the procedure were thoroughly explained, informed consent was obtained.  The PENTAX GASTOROSCOPE [id number] endoscope was introduced through the mouth and advanced to the second portion of the duodenum. Without limitations.  The instrument was slowly withdrawn as the mucosa was fully examined.   ESOPHAGUS: There was mild LA Class A esophagitis noted, likely from recent vomiting/retching.   The esophagus was otherwise normal.  STOMACH: The mucosa of the stomach appeared normal.  Biopsies were taken in the antrum and angularis to exclude H. Pylori.  DUODENUM: The duodenal mucosa showed no abnormalities in the bulb and second portion of the duodenum.  Retroflexed views revealed a small hiatal hernia.     The scope was then withdrawn from the patient and the procedure completed.  COMPLICATIONS: There were no complications. ENDOSCOPIC IMPRESSION:  1.   There was LA Class A esophagitis noted, likely related to recent retching/vomiting 2.   The esophagus was otherwise normal. 3.   The mucosa of the stomach appeared normal; biopsies were taken in the antrum and angularis 4.   The duodenal mucosa showed no abnormalities in the bulb and second portion of the duodenum  RECOMMENDATIONS: 1.  Await pathology results 2.  Daily PPI 3.  Continued symptomatic management/supportive  care  eSigned:  Beverley Fiedler, MD 03/04/2013 4:12 PM CC:The Patient

## 2013-03-05 ENCOUNTER — Inpatient Hospital Stay (HOSPITAL_COMMUNITY): Payer: BC Managed Care – PPO

## 2013-03-05 DIAGNOSIS — R197 Diarrhea, unspecified: Secondary | ICD-10-CM | POA: Diagnosis present

## 2013-03-05 DIAGNOSIS — G8929 Other chronic pain: Secondary | ICD-10-CM | POA: Diagnosis present

## 2013-03-05 DIAGNOSIS — R112 Nausea with vomiting, unspecified: Secondary | ICD-10-CM | POA: Diagnosis present

## 2013-03-05 MED ORDER — ONDANSETRON 8 MG/NS 50 ML IVPB
8.0000 mg | Freq: Once | INTRAVENOUS | Status: AC
  Administered 2013-03-05: 8 mg via INTRAVENOUS
  Filled 2013-03-05: qty 8

## 2013-03-05 MED ORDER — POLYETHYLENE GLYCOL 3350 17 G PO PACK
17.0000 g | PACK | Freq: Every day | ORAL | Status: DC
  Administered 2013-03-05: 17 g via ORAL
  Filled 2013-03-05 (×2): qty 1

## 2013-03-05 MED ORDER — LACTATED RINGERS IV BOLUS (SEPSIS)
1000.0000 mL | Freq: Once | INTRAVENOUS | Status: AC
  Administered 2013-03-05: 1000 mL via INTRAVENOUS

## 2013-03-05 MED ORDER — ACETAMINOPHEN 500 MG PO TABS
1000.0000 mg | ORAL_TABLET | Freq: Three times a day (TID) | ORAL | Status: DC
  Administered 2013-03-05 – 2013-03-08 (×11): 1000 mg via ORAL
  Filled 2013-03-05 (×17): qty 2

## 2013-03-05 MED ORDER — BISACODYL 10 MG RE SUPP: 10.0000 mg | Freq: Two times a day (BID) | RECTAL | Status: DC | PRN

## 2013-03-05 MED ORDER — BISACODYL 10 MG RE SUPP
10.0000 mg | Freq: Once | RECTAL | Status: AC
  Administered 2013-03-05: 10 mg via RECTAL
  Filled 2013-03-05: qty 1

## 2013-03-05 NOTE — Progress Notes (Signed)
Patient seen, examined, and I agree with the above documentation, including the assessment and plan. Persistent abd pain, persistent (though a bit better) nausea of unclear etiology She is frustrated a bit, but still very pleasant, which is understandable. No cross-sectional evidence for recurrent Crohn's We discussed colonoscopy given lack of clear understanding of her pain/nausea, but right now she does not think she can tolerate bowel prep (and I agree) No BM since last Tuesday (4 days) Bisacodyl PR today, add Miralax 17g daily  Supportive care

## 2013-03-05 NOTE — Progress Notes (Signed)
Patient ID: Robin Miller, female   DOB: 11-02-1990, 22 y.o.   MRN: [id number] [facility] Progress Note:   1 Day Post-Op  Subjective: Mental status is clear.  Asked questions about treatment plan Objective: Vital signs in last 24 hours: Temp:  [97.4 F (36.3 C)-98.9 F (37.2 C)] 98.6 F (37 C) (11/08 0529) Pulse Rate:  [77-113] 89 (11/08 0529) Resp:  [11-33] 20 (11/08 0529) BP: (87-130)/(31-82) 94/61 mmHg (11/08 0529) SpO2:  [97 %-100 %] 98 % (11/08 0529)  Intake/Output from previous day: 11/07 0701 - 11/08 0700 In: 3728.3 [I.V.:3728.3] Out: -  Intake/Output this shift:    Physical Exam: Work of breathing is normal.  Abdomen is flat and minimally tender to palpation  Lab Results:  Results for orders placed during the hospital encounter of 03/01/13 (from the past 48 hour(s))  LIPASE, BLOOD     Status: None   Collection Time    03/04/13  5:34 AM      Result Value Range   Lipase 33  11 - 59 U/L  HEPATIC FUNCTION PANEL     Status: Abnormal   Collection Time    03/04/13  5:34 AM      Result Value Range   Total Protein 6.8  6.0 - 8.3 g/dL   Albumin 3.0 (*) 3.5 - 5.2 g/dL   AST 10  0 - 37 U/L   ALT 5  0 - 35 U/L   Alkaline Phosphatase 45  39 - 117 U/L   Total Bilirubin 0.3  0.3 - 1.2 mg/dL   Bilirubin, Direct <0.8  0.0 - 0.3 mg/dL   Indirect Bilirubin NOT CALCULATED  0.3 - 0.9 mg/dL  CBC     Status: Abnormal   Collection Time    03/04/13  5:34 AM      Result Value Range   WBC 4.8  4.0 - 10.5 K/uL   RBC 3.88  3.87 - 5.11 MIL/uL   Hemoglobin 11.7 (*) 12.0 - 15.0 g/dL   HCT 65.7 (*) 84.6 - 96.2 %   MCV 90.7  78.0 - 100.0 fL   MCH 30.2  26.0 - 34.0 pg   MCHC 33.2  30.0 - 36.0 g/dL   RDW 95.2  84.1 - 32.4 %   Platelets 197  150 - 400 K/uL  C-REACTIVE PROTEIN     Status: Abnormal   Collection Time    03/04/13  5:34 AM      Result Value Range   CRP 1.8 (*) <0.60 mg/dL   Comment: Performed at [facility]    Radiology/Results: Nm  Hepatobiliary  03/04/2013   CLINICAL DATA:  Abdominal pain, nausea, vomiting, history of gallstones and Crohn's disease  EXAM: NUCLEAR MEDICINE HEPATOBILIARY IMAGING  TECHNIQUE: Sequential images of the abdomen were obtained out to 60 minutes following intravenous administration of radiopharmaceutical.  COMPARISON:  03/03/2013  RADIOPHARMACEUTICALS:  5.45mCi Tc-34m Choletec  FINDINGS: Normal hepatic uptake and excretion of the radiotracer into the biliary tract. Gallbladder is visualized beginning at 5 min and continues to accumulate radiotracer. Activity progresses through the biliary tract into the small bowel. Therefore, the cystic duct and common bile duct are both patent. Negative for cholecystitis.  IMPRESSION: Patent cystic duct and common bile duct.  Normal hepatobiliary scan.   Electronically Signed   By: Ruel Favors M.D.   On: 03/04/2013 12:38   US Abdomen Complete  03/03/2013   CLINICAL DATA:  22-year- female with distended gallbladder on CT. Abdominal pain. History of  Crohn disease. Initial encounter.  EXAM: ULTRASOUND ABDOMEN COMPLETE  COMPARISON:  CT Abdomen and Pelvis 03/02/2013 and earlier.  FINDINGS: Gallbladder  Layering gallstones within the gallbladder which is mildly distended. Gallbladder wall thickness remains normal at 1 to 2 mm. Multiple small stones, individually approximately 5 mm diameter. No sonographic Murphy sign elicited.  Common bile duct  Diameter: 4 mm, normal.  Liver  No focal lesion identified. Within normal limits in parenchymal echogenicity.  IVC  No abnormality visualized.  Pancreas  Visualized portion unremarkable.  Spleen  Size and appearance within normal limits.  Right Kidney  Length: 10.8 cm. Echogenicity within normal limits. No mass or hydronephrosis visualized.  Left Kidney  Length: 11.1 cm. Echogenicity within normal limits. No mass or hydronephrosis visualized.  Abdominal aorta  No aneurysm visualized.  IMPRESSION: Cholelithiasis, without evidence of acute  cholecystitis.   Electronically Signed   By: Augusto Gamble M.D.   On: 03/03/2013 20:45    Anti-infectives: Anti-infectives   None      Assessment/Plan: Problem List: Patient Active Problem List   Diagnosis Date Noted  . 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 Renal stone 05/27/2012  . Renal colic on right side 05/27/2012  . 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 Medication side effect 11/04/2011  . History of anemia 04/15/2011    EGD was OK.  Perhaps colonoscopy to look across ileocolic anastomosis and assess for Crohns or stricture to explain symptoms.   1 Day Post-Op    LOS: 4 days   Matt B. Daphine Deutscher, MD, [facility] [phone] beeper [phone]  03/05/2013 7:11 AM

## 2013-03-05 NOTE — Progress Notes (Signed)
Patient ID: Robin Miller, female   DOB: 1990-10-27, 22 y.o.   MRN: [id number] [facility] Progress Note  Subjective: Keeping some full liquids down this am without vomiting- did vomit last night. No Bm. Pain persists.  Objective:  Vital signs in last 24 hours: Temp:  [97.4 F (36.3 C)-98.9 F (37.2 C)] 98.6 F (37 C) (11/08 0529) Pulse Rate:  [77-113] 89 (11/08 0529) Resp:  [11-33] 20 (11/08 0529) BP: (87-130)/(31-82) 94/61 mmHg (11/08 0529) SpO2:  [97 %-100 %] 98 % (11/08 0529) Last BM Date: 03/01/13 General:   Alert,  Well-developed, thin wf    in NAD Heart:  Regular rate and rhythm; no murmurs Pulm;clear Abdomen:  Soft,tender  Across mid abdomen and now more focally tender in LMQ,guarding, no rebound  Extremities:  Without edema. Neurologic:  Alert and  oriented x4;  grossly normal neurologically. Psych:  Alert and cooperative. Normal mood and affect.  Intake/Output from previous day: 11/07 0701 - 11/08 0700 In: 3728.3 [I.V.:3728.3] Out: -  Intake/Output this shift:    Lab Results:  Recent Labs  03/04/13 0534  WBC 4.8  HGB 11.7*  HCT 35.2*  PLT 197   BMET  Recent Labs  03/03/13 0334  NA 138  K 3.7  CL 109  CO2 18*  GLUCOSE 102*  BUN 7  CREATININE 0.60  CALCIUM 8.5   LFT  Recent Labs  03/04/13 0534  PROT 6.8  ALBUMIN 3.0*  AST 10  ALT 5  ALKPHOS 45  BILITOT 0.3  BILIDIR <0.1  IBILI NOT CALCULATED     Assessment / Plan: #1 22 yo female with Crohns disease-s/o ileocecectomy 11/13- now with 6 day hx of persistent nausea,vomiting, and abdomianl pain Cholelithiasis but no cholecystitis Hx ureterolithiasis- no current evidence Negative EGD,negative CT on admit ? 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

## 2013-03-05 NOTE — Progress Notes (Addendum)
Robin Miller [id number] [birth year]  CARE TEAM:  PCP: Robin Mackintosh, MD  Outpatient Care Team: Patient Care Team: Robin Mackintosh, MD as PCP - General (Internal Medicine) Robin Sportsman, MD as Consulting Physician (General Surgery) Robin Dare, MD as Consulting Physician (Gastroenterology)  Inpatient Treatment Team: Treatment Team: Attending Provider: [name], MD; Registered Nurse: Robin Garnet, RN; Technician: Robin Miller, NT; Dietitian: Lavena Bullion, RD; Registered Nurse: Robin Son, RN; Registered Nurse: Robin Linden, RN; Registered Nurse: Robin Baldy Block, RN; Technician: Robin Miller, NT; Registered Nurse: Robin Miller; Consulting Physician: Robin Limbo, MD; Registered Nurse: Robin Box, RN; Technician: Robin Miller, NT   Subjective:  Still with diffuse abdominal cramping.  Decreased appetite.  Tolerated liquids with nausea medicines.  Not getting up much  Objective:  Vital signs:  Filed Vitals:   03/04/13 1625 03/04/13 1637 03/04/13 2213 03/05/13 0529  BP: 104/55 106/73 130/71 94/61  Pulse: 83 77 87 89  Temp:  98.9 F (37.2 C) 98 F (36.7 C) 98.6 F (37 C)  TempSrc:   Oral Oral  Resp: 11 11 16 20   Height:      Weight:      SpO2: 97% 100% 99% 98%    Miller BM Date: 03/01/13  Intake/Output   Yesterday:  11/07 0701 - 11/08 0700 In: 3728.3 [I.V.:3728.3] Out: -  This shift:     Bowel function:  Flatus: scant  BM: n  Drain: n/a  Physical Exam:  General: Pt awake/alert/oriented x4 in no acute distress Eyes: PERRL, normal EOM.  Sclera clear.  No icterus Neuro: CN II-XII intact w/o focal sensory/motor deficits. Lymph: No head/neck/groin lymphadenopathy Psych:  No delerium/psychosis/paranoia HENT: Normocephalic, Mucus membranes moist.  No thrush Neck: Supple, No tracheal deviation Chest: No chest wall pain w good excursion CV:  Pulses intact.  Regular rhythm MS: Normal AROM mjr joints.  No obvious  deformity Abdomen: Mildly distended.  Diffusely tender throughout & somewhat firm.  No evidence of peritonitis.  No incarcerated hernias. Ext:  SCDs BLE.  No mjr edema.  No cyanosis Skin: No petechiae / purpura   Problem List:   Principal Problem:   Abdominal pain, chronic, right lower quadrant Active Problems:   History of anemia   Crohn's disease with fistula & stenosis of ileocecal valve s/p ileocectomy 03/16/2012   Diarrhea   Nausea with vomiting   Assessment  Robin Miller  22 y.o. female  1 Day Post-Op  Procedure(s): ESOPHAGOGASTRODUODENOSCOPY (EGD)  Persistent abdominal pain of uncertain etiology.  Plan:  I am not exactly certain what is going on.  CT scan and x-rays argue against a bowel obstruction.  Contrast and gas going down the colon.  Moderate stool burden but not severe.  I tried to reassure her that at least I do not think that there is no surgical emergency going on.  She is mildly frustrated and anxious about this but seemed consoled by that.  I strongly recommend some type of bowel regimen.  Consider MiraLax or flaxseed given her prior poor tolerance of psyllium based fiber in the past.  Again I think a colonoscopy would be helpful to see with going on in the anastomosis when tolerable.  Consider gastric emptying study to make sure that that is not an issue as well.  I am a little more skeptical that today, but I wish to be thorough.  If she is having regular bowel movements, normal gastric emptying study, normal  colonoscopy, & normal gastroenterology evaluation but having persistent symptoms; then, consider cholecystectomy.  I doubt it will come to that point.  -VTE prophylaxis- SCDs, etc -mobilize as tolerated to help recovery  Robin Miller, M.D., F.A.C.S. Gastrointestinal and Minimally Invasive Surgery [facility] [address] [place], [place] [address] [phone] Main / Paging   03/05/2013   Results:    Labs: Results for orders placed during the hospital encounter of 03/01/13 (from the past 48 hour(s))  LIPASE, BLOOD     Status: None   Collection Time    03/04/13  5:34 AM      Result Value Range   Lipase 33  11 - 59 U/L  HEPATIC FUNCTION PANEL     Status: Abnormal   Collection Time    03/04/13  5:34 AM      Result Value Range   Total Protein 6.8  6.0 - 8.3 g/dL   Albumin 3.0 (*) 3.5 - 5.2 g/dL   AST 10  0 - 37 U/L   ALT 5  0 - 35 U/L   Alkaline Phosphatase 45  39 - 117 U/L   Total Bilirubin 0.3  0.3 - 1.2 mg/dL   Bilirubin, Direct <8.2  0.0 - 0.3 mg/dL   Indirect Bilirubin NOT CALCULATED  0.3 - 0.9 mg/dL  CBC     Status: Abnormal   Collection Time    03/04/13  5:34 AM      Result Value Range   WBC 4.8  4.0 - 10.5 K/uL   RBC 3.88  3.87 - 5.11 MIL/uL   Hemoglobin 11.7 (*) 12.0 - 15.0 g/dL   HCT 95.6 (*) 21.3 - 08.6 %   MCV 90.7  78.0 - 100.0 fL   MCH 30.2  26.0 - 34.0 pg   MCHC 33.2  30.0 - 36.0 g/dL   RDW 57.8  46.9 - 62.9 %   Platelets 197  150 - 400 K/uL  C-REACTIVE PROTEIN     Status: Abnormal   Collection Time    03/04/13  5:34 AM      Result Value Range   CRP 1.8 (*) <0.60 mg/dL   Comment: Performed at [facility] / Studies: Nm Hepatobiliary  03/04/2013   CLINICAL DATA:  Abdominal pain, nausea, vomiting, history of gallstones and Crohn's disease  EXAM: NUCLEAR MEDICINE HEPATOBILIARY IMAGING  TECHNIQUE: Sequential images of the abdomen were obtained out to 60 minutes following intravenous administration of radiopharmaceutical.  COMPARISON:  03/03/2013  RADIOPHARMACEUTICALS:  5.61mCi Tc-29m Choletec  FINDINGS: Normal hepatic uptake and excretion of the radiotracer into the biliary tract. Gallbladder is visualized beginning at 5 min and continues to accumulate radiotracer. Activity progresses through the biliary tract into the small bowel. Therefore, the cystic duct and common bile duct are both patent. Negative for cholecystitis.  IMPRESSION: Patent  cystic duct and common bile duct.  Normal hepatobiliary scan.   Electronically Signed   By: Robin Miller M.D.   On: 03/04/2013 12:38   US Abdomen Complete  03/03/2013   CLINICAL DATA:  22-year- female with distended gallbladder on CT. Abdominal pain. History of Crohn disease. Initial encounter.  EXAM: ULTRASOUND ABDOMEN COMPLETE  COMPARISON:  CT Abdomen and Pelvis 03/02/2013 and earlier.  FINDINGS: Gallbladder  Layering gallstones within the gallbladder which is mildly distended. Gallbladder wall thickness remains normal at 1 to 2 mm. Multiple small stones, individually approximately 5 mm diameter. No sonographic Murphy sign elicited.  Common  bile duct  Diameter: 4 mm, normal.  Liver  No focal lesion identified. Within normal limits in parenchymal echogenicity.  IVC  No abnormality visualized.  Pancreas  Visualized portion unremarkable.  Spleen  Size and appearance within normal limits.  Right Kidney  Length: 10.8 cm. Echogenicity within normal limits. No mass or hydronephrosis visualized.  Left Kidney  Length: 11.1 cm. Echogenicity within normal limits. No mass or hydronephrosis visualized.  Abdominal aorta  No aneurysm visualized.  IMPRESSION: Cholelithiasis, without evidence of acute cholecystitis.   Electronically Signed   By: Augusto Gamble M.D.   On: 03/03/2013 20:45   Dg Abd Acute W/chest  03/05/2013   CLINICAL DATA:  Crohn's disease, abdominal pain  EXAM: ACUTE ABDOMEN SERIES (ABDOMEN 2 VIEW & CHEST 1 VIEW)  COMPARISON:  CT 03/02/2013  FINDINGS: Normal mediastinum and cardiac silhouette. Normal pulmonary vasculature. No evidence of effusion, infiltrate, or pneumothorax. No acute bony abnormality.  Oral contrast from recent CT scan has progressed into the colon to the level of the descending colon. There are no dilated loops of large or small bowel. No intraperitoneal free air. No pathologic calcifications. Mild levoscoliosis of the lumbar spine.  IMPRESSION: 1. Clear chest. 2. No evidence of bowel  obstruction or intraperitoneal free air. 3. Findings conveyed toSteve Grosson 03/05/2013 at10:03.   Electronically Signed   By: Genevive Bi M.D.   On: 03/05/2013 10:04    Medications / Allergies: per chart  Antibiotics: Anti-infectives   None

## 2013-03-06 DIAGNOSIS — G8929 Other chronic pain: Secondary | ICD-10-CM

## 2013-03-06 DIAGNOSIS — R1031 Right lower quadrant pain: Secondary | ICD-10-CM

## 2013-03-06 MED ORDER — PROMETHAZINE HCL 25 MG/ML IJ SOLN
25.0000 mg | INTRAMUSCULAR | Status: DC | PRN
  Administered 2013-03-06 – 2013-03-11 (×16): 25 mg via INTRAVENOUS
  Filled 2013-03-06 (×18): qty 1

## 2013-03-06 MED ORDER — PEG-KCL-NACL-NASULF-NA ASC-C 100 G PO SOLR: 1.0000 | Freq: Once | ORAL | Status: DC

## 2013-03-06 MED ORDER — PROMETHAZINE HCL 25 MG/ML IJ SOLN
25.0000 mg | Freq: Once | INTRAMUSCULAR | Status: AC
  Administered 2013-03-06: 25 mg via INTRAVENOUS

## 2013-03-06 MED ORDER — BISACODYL 10 MG RE SUPP
10.0000 mg | Freq: Once | RECTAL | Status: AC
  Administered 2013-03-06: 10 mg via RECTAL
  Filled 2013-03-06: qty 1

## 2013-03-06 MED ORDER — PEG-KCL-NACL-NASULF-NA ASC-C 100 G PO SOLR
0.5000 | Freq: Once | ORAL | Status: AC
  Administered 2013-03-06: 100 g via ORAL
  Filled 2013-03-06: qty 1

## 2013-03-06 MED ORDER — POLYETHYLENE GLYCOL 3350 17 G PO PACK
17.0000 g | PACK | Freq: Two times a day (BID) | ORAL | Status: DC
  Filled 2013-03-06 (×2): qty 1

## 2013-03-06 MED ORDER — PEG-KCL-NACL-NASULF-NA ASC-C 100 G PO SOLR
0.5000 | Freq: Once | ORAL | Status: AC
  Administered 2013-03-06: 100 g via ORAL

## 2013-03-06 NOTE — Progress Notes (Signed)
Patient seen, examined, and I agree with the above documentation, including the assessment and plan. Abd pain today "slightly worse".   Negative work-up to date, other than layering gallstones seen by [place].  No acute cholecystitis.   Colonoscopy discussed and she is willing to proceed.  Will use IV reglan with prep to help with nausea and vomiting. Exam tomorrow should help shed light on colon and specific neo-TI/anastomosis to hopefully definitively answer the question of recurrent Crohn's

## 2013-03-06 NOTE — Progress Notes (Signed)
Robin Miller [id number] Jul 24, 1990  CARE TEAM:  PCP: [name], MD  Outpatient Care Team: Patient Care Team: [name], MD as PCP - General (Internal Medicine) [name], MD as Consulting Physician (General Surgery) [name], MD as Consulting Physician (Gastroenterology)  Inpatient Treatment Team: Treatment Team: Attending Provider: [name], MD; Technician: Gwynneth Albright, NT; Dietitian: Lavena Bullion, RD; Registered Nurse: Lyla Son, RN; Registered Nurse: Ninfa Linden, RN; Registered Nurse: Violet Baldy Block, RN; Technician: Fayrene Helper, NT; Registered Nurse: Gwenith Daily; Consulting Physician: Bishop Limbo, MD; Registered Nurse: Lestine Box, RN; Technician: Berlinda Last, NT   Subjective:  Still with diffuse abdominal cramping.  Appetite a little better.  Tolerated liquids  Not getting up much  Objective:  Vital signs:  Filed Vitals:   03/05/13 0529 03/05/13 1532 03/05/13 2127 03/06/13 0548  BP: 94/61 105/64 97/66 108/68  Pulse: 89 82 87 83  Temp: 98.6 F (37 C) 98 F (36.7 C) 98.1 F (36.7 C) 97.9 F (36.6 C)  TempSrc: Oral Oral Oral Oral  Resp: 20 16 18 18   Height:      Weight:      SpO2: 98% 98% 97% 98%    Last BM Date: 03/01/13  Intake/Output   Yesterday:  11/08 0701 - 11/09 0700 In: 1271.7 [I.V.:1271.7] Out: -  This shift:     Bowel function:  Flatus: yes  BM: x1  Drain: n/a  Physical Exam:  General: Pt awake/alert/oriented x4 in no acute distress Eyes: PERRL, normal EOM.  Sclera clear.  No icterus Neuro: CN II-XII intact w/o focal sensory/motor deficits. Lymph: No head/neck/groin lymphadenopathy Psych:  No delerium/psychosis/paranoia.  Remains depressed/anxious HENT: Normocephalic, Mucus membranes moist.  No thrush Neck: Supple, No tracheal deviation Chest: No chest wall pain w good excursion CV:  Pulses intact.  Regular rhythm MS: Normal AROM mjr joints.  No obvious  deformity Abdomen: Mildly distended.  Diffusely tender throughout but less & less firm.  No evidence of peritonitis.  No incarcerated hernias. Ext:  SCDs BLE.  No mjr edema.  No cyanosis Skin: No petechiae / purpura   Problem List:   Principal Problem:   Abdominal pain, chronic, right lower quadrant Active Problems:   History of anemia   Crohn's disease with fistula & stenosis of ileocecal valve s/p ileocectomy 03/16/2012   Diarrhea   Nausea with vomiting   Assessment  Robin Miller  22 y.o. female  2 Days Post-Op  Procedure(s): ESOPHAGOGASTRODUODENOSCOPY (EGD)  Persistent abdominal pain of uncertain etiology.  Plan:  I am not exactly certain what is going on.  CT scan and x-rays argue against a bowel obstruction.  Contrast and gas going down the colon.  Moderate stool burden but not severe.  I tried to reassure her that at least I do not think that there is no surgical emergency going on.  She is mildly frustrated and anxious about this but seemed consoled by that.  I strongly recommend some type of bowel regimen.  Consider MiraLax or flaxseed given her prior poor tolerance of psyllium based fiber in the past.  Again I think a colonoscopy would be helpful to see with going on in the anastomosis when tolerable.  Consider gastric emptying study to make sure that that is not an issue as well.  I am a little more skeptical that today, but I wish to be thorough.  If she is having regular bowel movements, normal gastric emptying study,  normal colonoscopy, & normal gastroenterology evaluation but having persistent symptoms; then, consider cholecystectomy.  I doubt it will come to that point.  -VTE prophylaxis- SCDs, etc  -mobilize as tolerated to help recovery  The patient is stable.  There is no evidence of peritonitis, acute abdomen, nor shock.  There is no strong evidence of failure of improvement nor decline with current non-operative management.  There is no need for surgery at  the present moment.  We will continue to follow.   [name], M.D., F.A.C.S. Gastrointestinal and Minimally Invasive Surgery [facility] [address] [place], [place] [address] [phone] Main / Paging   03/06/2013   Results:   Labs: No results found for this or any previous visit (from the past 48 hour(s)).  Imaging / Studies: Nm Hepatobiliary  03/04/2013   CLINICAL DATA:  Abdominal pain, nausea, vomiting, history of gallstones and Crohn's disease  EXAM: NUCLEAR MEDICINE HEPATOBILIARY IMAGING  TECHNIQUE: Sequential images of the abdomen were obtained out to 60 minutes following intravenous administration of radiopharmaceutical.  COMPARISON:  03/03/2013  RADIOPHARMACEUTICALS:  5.24mCi Tc-39m Choletec  FINDINGS: Normal hepatic uptake and excretion of the radiotracer into the biliary tract. Gallbladder is visualized beginning at 5 min and continues to accumulate radiotracer. Activity progresses through the biliary tract into the small bowel. Therefore, the cystic duct and common bile duct are both patent. Negative for cholecystitis.  IMPRESSION: Patent cystic duct and common bile duct.  Normal hepatobiliary scan.   Electronically Signed   By: Ruel Favors M.D.   On: 03/04/2013 12:38   Dg Abd Acute W/chest  03/05/2013   CLINICAL DATA:  Crohn's disease, abdominal pain  EXAM: ACUTE ABDOMEN SERIES (ABDOMEN 2 VIEW & CHEST 1 VIEW)  COMPARISON:  CT 03/02/2013  FINDINGS: Normal mediastinum and cardiac silhouette. Normal pulmonary vasculature. No evidence of effusion, infiltrate, or pneumothorax. No acute bony abnormality.  Oral contrast from recent CT scan has progressed into the colon to the level of the descending colon. There are no dilated loops of large or small bowel. No intraperitoneal free air. No pathologic calcifications. Mild levoscoliosis of the lumbar spine.  IMPRESSION: 1. Clear chest. 2. No evidence of bowel obstruction or intraperitoneal free  air. 3. Findings conveyed toSteve Grosson 03/05/2013 at10:03.   Electronically Signed   By: Genevive Bi M.D.   On: 03/05/2013 10:04    Medications / Allergies: per chart  Antibiotics: Anti-infectives   None

## 2013-03-06 NOTE — Progress Notes (Signed)
Patient ID: Robin Miller, female   DOB: [birth year], 23 y.o.   MRN: [id number] [facility] Progress Note  Subjective: Still ghurting,cramping more on left side now. Still nauseated but keeping down full liquifoemed BM last pm. She is willing to try a bowel prep...  Objective:  Vital signs in last 24 hours: Temp:  [97.9 F (36.6 C)-98.1 F (36.7 C)] 97.9 F (36.6 C) (11/09 0548) Pulse Rate:  [82-87] 83 (11/09 0548) Resp:  [16-18] 18 (11/09 0548) BP: (97-108)/(64-68) 108/68 mmHg (11/09 0548) SpO2:  [97 %-98 %] 98 % (11/09 0548) Last BM Date: 03/01/13 General:   Alert,  Well-developed, young WF   in NAD Heart:  Regular rate and rhythm; no murmurs Pulm;clear Abdomen:  Sof ttender mid abdomen,and left mid  and nondistended. Normal bowel sounds, without guarding, and without rebound.   Extremities:  Without edema. Neurologic:  Alert and  oriented x4;  grossly normal neurologically. Psych:  Alert and cooperative. Normal mood and affect.  Intake/Output from previous day: 11/08 0701 - 11/09 0700 In: 1271.7 [I.V.:1271.7] Out: -  Intake/Output this shift:    Lab Results:  Recent Labs  03/04/13 0534  WBC 4.8  HGB 11.7*  HCT 35.2*  PLT 197   BMET No results found for this basename: NA, K, CL, CO2, GLUCOSE, BUN, CREATININE, CALCIUM,  in the last 72 hours LFT  Recent Labs  03/04/13 0534  PROT 6.8  ALBUMIN 3.0*  AST 10  ALT 5  ALKPHOS 45  BILITOT 0.3  BILIDIR <0.1  IBILI NOT CALCULATED      Assessment / Plan: #1  22 yo female with Crohns disease-s/p ileocecectomy 02/2012 now with several day hx of acute onset nausea,vomting and upper abdominal pain Workup thus far unrevealing except for cholelithiasis No evidence for active Crohns by CT but some concern regarding reactivation of Crohns and/or anastomotic stenosis etc.  Will plan to prep today for colonoscopy tomorrow with Dr. Ria Clock she can tolerate if has pain meds and antiemetics on  board Principal Problem:   Abdominal pain, chronic, right lower quadrant Active Problems:   History of anemia   Crohn's disease with fistula & stenosis of ileocecal valve s/p ileocectomy 03/16/2012   Diarrhea   Nausea with vomiting     LOS: 5 days   Amy Esterwood  03/06/2013, 8:43 AM

## 2013-03-06 NOTE — Progress Notes (Signed)
Patient ID: LEGACY CARRENDER, female   DOB: 07/17/1990, 22 y.o.   MRN: [id number] [facility] Progress Note:   2 Days Post-Op  Subjective: Mental status is clear.  Objective: Vital signs in last 24 hours: Temp:  [97.9 F (36.6 C)-98.1 F (36.7 C)] 97.9 F (36.6 C) (11/09 0548) Pulse Rate:  [82-87] 83 (11/09 0548) Resp:  [16-18] 18 (11/09 0548) BP: (97-108)/(64-68) 108/68 mmHg (11/09 0548) SpO2:  [97 %-98 %] 98 % (11/09 0548)  Intake/Output from previous day: 11/08 0701 - 11/09 0700 In: 1271.7 [I.V.:1271.7] Out: -  Intake/Output this shift:    Physical Exam: Work of breathing is normal.  Abdomen is currently not hurting but she had an episode of sharp LLQ pain this am that was random.    Lab Results:  No results found for this or any previous visit (from the past 48 hour(s)).  Radiology/Results: Nm Hepatobiliary  03/04/2013   CLINICAL DATA:  Abdominal pain, nausea, vomiting, history of gallstones and Crohn's disease  EXAM: NUCLEAR MEDICINE HEPATOBILIARY IMAGING  TECHNIQUE: Sequential images of the abdomen were obtained out to 60 minutes following intravenous administration of radiopharmaceutical.  COMPARISON:  03/03/2013  RADIOPHARMACEUTICALS:  5.37mCi Tc-59m Choletec  FINDINGS: Normal hepatic uptake and excretion of the radiotracer into the biliary tract. Gallbladder is visualized beginning at 5 min and continues to accumulate radiotracer. Activity progresses through the biliary tract into the small bowel. Therefore, the cystic duct and common bile duct are both patent. Negative for cholecystitis.  IMPRESSION: Patent cystic duct and common bile duct.  Normal hepatobiliary scan.   Electronically Signed   By: Ruel Favors M.D.   On: 03/04/2013 12:38   Dg Abd Acute W/chest  03/05/2013   CLINICAL DATA:  Crohn's disease, abdominal pain  EXAM: ACUTE ABDOMEN SERIES (ABDOMEN 2 VIEW & CHEST 1 VIEW)  COMPARISON:  CT 03/02/2013  FINDINGS: Normal mediastinum and cardiac silhouette. Normal  pulmonary vasculature. No evidence of effusion, infiltrate, or pneumothorax. No acute bony abnormality.  Oral contrast from recent CT scan has progressed into the colon to the level of the descending colon. There are no dilated loops of large or small bowel. No intraperitoneal free air. No pathologic calcifications. Mild levoscoliosis of the lumbar spine.  IMPRESSION: 1. Clear chest. 2. No evidence of bowel obstruction or intraperitoneal free air. 3. Findings conveyed toSteve Grosson 03/05/2013 at10:03.   Electronically Signed   By: Genevive Bi M.D.   On: 03/05/2013 10:04    Anti-infectives: Anti-infectives   None      Assessment/Plan: Problem List: Patient Active Problem List   Diagnosis Date Noted  . Diarrhea 03/05/2013  . Abdominal pain, chronic, right lower quadrant 03/05/2013  . Nausea with vomiting 03/05/2013  . 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 Renal stone 05/27/2012  . Renal colic on right side 05/27/2012  . 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 Medication side effect 11/04/2011  . History of anemia 04/15/2011    Agree with plan for colonscopy tomorrow to sort out status of ileocolostomy. 2 Days Post-Op    LOS: 5 days   Matt B. Daphine Deutscher, MD, [facility] [phone] beeper [phone]  03/06/2013 9:35 AM

## 2013-03-07 ENCOUNTER — Encounter (HOSPITAL_COMMUNITY): Payer: Self-pay | Admitting: Internal Medicine

## 2013-03-07 ENCOUNTER — Encounter (HOSPITAL_COMMUNITY): Payer: BC Managed Care – PPO | Admitting: Anesthesiology

## 2013-03-07 ENCOUNTER — Inpatient Hospital Stay (HOSPITAL_COMMUNITY): Payer: BC Managed Care – PPO | Admitting: Anesthesiology

## 2013-03-07 ENCOUNTER — Encounter (HOSPITAL_COMMUNITY)
Admission: AD | Disposition: A | Discharge status: Home / Self Care | Payer: BC Managed Care – PPO | Source: Ambulatory Visit | Attending: Gastroenterology

## 2013-03-07 DIAGNOSIS — K8010 Calculus of gallbladder with chronic cholecystitis without obstruction: Secondary | ICD-10-CM | POA: Diagnosis not present

## 2013-03-07 DIAGNOSIS — R112 Nausea with vomiting, unspecified: Secondary | ICD-10-CM | POA: Diagnosis not present

## 2013-03-07 HISTORY — PX: COLONOSCOPY: SHX5424

## 2013-03-07 SURGERY — COLONOSCOPY
Anesthesia: Monitor Anesthesia Care

## 2013-03-07 MED ORDER — LACTATED RINGERS IV SOLN
INTRAVENOUS | Status: DC
  Administered 2013-03-07: 1000 mL via INTRAVENOUS

## 2013-03-07 MED ORDER — LIDOCAINE HCL (CARDIAC) 20 MG/ML IV SOLN
INTRAVENOUS | Status: DC | PRN
  Administered 2013-03-07: 60 mg via INTRAVENOUS

## 2013-03-07 MED ORDER — PROPOFOL 10 MG/ML IV BOLUS
INTRAVENOUS | Status: DC | PRN
  Administered 2013-03-07 (×4): 20 mg via INTRAVENOUS

## 2013-03-07 MED ORDER — PROPOFOL INFUSION 10 MG/ML OPTIME
INTRAVENOUS | Status: DC | PRN
  Administered 2013-03-07: 120 ug/kg/min via INTRAVENOUS

## 2013-03-07 NOTE — Anesthesia Preprocedure Evaluation (Addendum)
Anesthesia Evaluation  Patient identified by MRN, date of birth, ID band Patient awake    Reviewed: Allergy & Precautions, H&P , NPO status , Patient's Chart, lab work & pertinent test results  Airway Mallampati: II TM Distance: >3 FB Neck ROM: full    Dental no notable dental hx. (+) Teeth Intact and Dental Advisory Given   Pulmonary neg pulmonary ROS,  breath sounds clear to auscultation  Pulmonary exam normal       Cardiovascular Exercise Tolerance: Good negative cardio ROS  Rhythm:regular Rate:Normal  Vasovagal syncope   Neuro/Psych negative neurological ROS  negative psych ROS   GI/Hepatic negative GI ROS, Neg liver ROS,   Endo/Other  negative endocrine ROS  Renal/GU negative Renal ROS  negative genitourinary   Musculoskeletal   Abdominal   Peds  Hematology negative hematology ROS (+)   Anesthesia Other Findings Crohn's  Reproductive/Obstetrics negative OB ROS                          Anesthesia Physical Anesthesia Plan  ASA: II  Anesthesia Plan: MAC   Post-op Pain Management:    Induction:   Airway Management Planned: Simple Face Mask  Additional Equipment:   Intra-op Plan:   Post-operative Plan:   Informed Consent: I have reviewed the patients History and Physical, chart, labs and discussed the procedure including the risks, benefits and alternatives for the proposed anesthesia with the patient or authorized representative who has indicated his/her understanding and acceptance.   Dental Advisory Given  Plan Discussed with: CRNA and Surgeon  Anesthesia Plan Comments:         Anesthesia Quick Evaluation

## 2013-03-07 NOTE — Progress Notes (Signed)
3 Days Post-Op  Subjective: Pt complaining of pain, mostly LLQ, anxious, no nausea or vomiting.  Worried she has not had anymore stool since last PM.  Objective: Vital signs in last 24 hours: Temp:  [97.9 F (36.6 C)-98.4 F (36.9 C)] 98.3 F (36.8 C) (11/10 0526) Pulse Rate:  [69-99] 96 (11/10 0526) Resp:  [16] 16 (11/10 0526) BP: (100-110)/(56-73) 102/56 mmHg (11/10 0526) SpO2:  [95 %-99 %] 97 % (11/10 0526) Last BM Date: 03/05/13 VSS, afebrile  Diet: NPO  Intake/Output from previous day: 11/09 0701 - 11/10 0700 In: 2860.8 [P.O.:720; I.V.:2140.8] Out: -  Intake/Output this shift:    General appearance: alert, cooperative and anxious, complaing of abdominal pain, a little better with K-pad. Resp: clear to auscultation bilaterally GI: soft, she complains of tenderness,rather diffuse, but worst is in the LLQ.  BS hypoactive, she isn't really distended.  No peritonitis.  Lab Results:  No results found for this basename: WBC, HGB, HCT, PLT,  in the last 72 hours  BMET No results found for this basename: NA, K, CL, CO2, GLUCOSE, BUN, CREATININE, CALCIUM,  in the last 72 hours PT/INR No results found for this basename: LABPROT, INR,  in the last 72 hours   Recent Labs Lab 03/01/13 1738 03/04/13 0534  AST 13 10  ALT 7 5  ALKPHOS 43 45  BILITOT 0.2* 0.3  PROT 7.4 6.8  ALBUMIN 3.6 3.0*     Lipase     Component Value Date/Time   LIPASE 33 03/04/2013 0534     Studies/Results: Dg Abd Acute W/chest  03/05/2013   CLINICAL DATA:  Crohn's disease, abdominal pain  EXAM: ACUTE ABDOMEN SERIES (ABDOMEN 2 VIEW & CHEST 1 VIEW)  COMPARISON:  CT 03/02/2013  FINDINGS: Normal mediastinum and cardiac silhouette. Normal pulmonary vasculature. No evidence of effusion, infiltrate, or pneumothorax. No acute bony abnormality.  Oral contrast from recent CT scan has progressed into the colon to the level of the descending colon. There are no dilated loops of large or small bowel. No  intraperitoneal free air. No pathologic calcifications. Mild levoscoliosis of the lumbar spine.  IMPRESSION: 1. Clear chest. 2. No evidence of bowel obstruction or intraperitoneal free air. 3. Findings conveyed toSteve Grosson 03/05/2013 at10:03.   Electronically Signed   By: Genevive Bi M.D.   On: 03/05/2013 10:04    Medications: . acetaminophen  1,000 mg Oral TID  . feeding supplement (RESOURCE BREEZE)  1 Container Oral TID BM  . lip balm  1 application Topical BID  . mercaptopurine  50 mg Oral Daily  . multivitamin with minerals  1 tablet Oral Daily  . pantoprazole  40 mg Oral Q0600  . saccharomyces boulardii  250 mg Oral BID    Assessment/Plan Abdominal pain, nausea and vomiting of uncertain etiology Crohn's disease s/p ileocecectomy 02/2012 EGD 03/04/13 Dr. Rhea Belton  Minimal esophagitis from vomiting No SBO 03/05/13 film Gallstones, normal CBD, negative HIDA Normal LFT'S, LIPASE AND WBC Anemia (H/H=11.7/35.2)    Plan:  For Colonoscopy today.  No labs for today, last WBC was 4.8.      LOS: 6 days    Delvina Mizzell 03/07/2013

## 2013-03-07 NOTE — Anesthesia Postprocedure Evaluation (Signed)
  Anesthesia Post-op Note  Patient: Robin Miller  Procedure(s) Performed: Procedure(s) (LRB): COLONOSCOPY (N/A)  Patient Location: PACU  Anesthesia Type: MAC  Level of Consciousness: awake and alert   Airway and Oxygen Therapy: Patient Spontanous Breathing  Post-op Pain: mild  Post-op Assessment: Post-op Vital signs reviewed, Patient's Cardiovascular Status Stable, Respiratory Function Stable, Patent Airway and No signs of Nausea or vomiting  Last Vitals:  Filed Vitals:   03/07/13 1548  BP: 126/81  Pulse: 77  Temp: 36.5 C  Resp: 18    Post-op Vital Signs: stable   Complications: No apparent anesthesia complications

## 2013-03-07 NOTE — Op Note (Signed)
[facility] [address] [place], [address]   COLONOSCOPY PROCEDURE REPORT  PATIENT: Robin Miller, Robin Miller  MR#: [id number] BIRTHDATE: 06-22-1990 , 22  yrs. old GENDER: Female ENDOSCOPIST: Iva Boop, MD, [facility] PROCEDURE DATE:  03/07/2013 PROCEDURE:   Colonoscopy with biopsy First Screening Colonoscopy - Avg.  risk and is 50 yrs.  old or older - No.  Prior Negative Screening - Now for repeat screening. N/A  History of Adenoma - Now for follow-up colonoscopy & has been > or = to 3 yrs.  N/A ASA CLASS:   Class II INDICATIONS:abdominal pain in the lower right quadrant and High risk patient with previously diagnosed Crohn's Disease. MEDICATIONS: See Anesthesia Report.  DESCRIPTION OF PROCEDURE:   After the risks benefits and alternatives of the procedure were thoroughly explained, informed consent was obtained.  A digital rectal exam revealed no abnormalities of the rectum.   The Pentax Colonoscope [id number] endoscope was introduced through the anus and advanced to the terminal ileum which was intubated for a short distance. No adverse events experienced.   The quality of the prep was Moviprep fair The instrument was then slowly withdrawn as the colon was fully examined.      COLON FINDINGS: There was evidence of a prior end-to-side ileocolonic surgical anastomosis The finding was in the right colon.   The mucosa appeared normal in the terminal ileum. Multiple random biopsies of the area were performed.   A normal appearing cecum, ileocecal valve, and appendiceal orifice were identified.  The ascending, hepatic flexure, transverse, splenic flexure, descending, sigmoid colon and rectum appeared unremarkable.  No polyps or cancers were seen.  Retroflexed views revealed no abnormalities.   .       .  The scope was withdrawn and the procedure completed. COMPLICATIONS: There were no complications.  ENDOSCOPIC IMPRESSION: 1.   There was evidence of a prior  ileocolonic surgical anastomosis in the right colon 2.   Normal mucosa in the terminal ileum; multiple random biopsies of the area were performed to look for microscopic Crohn's activity but nothing to correlate with her problems seen 3.   Normal colon but fair prep only so though no clear Crohn's colitis exam of colon suboptimal. No strictures or other findings that would correlate with sxs.  RECOMMENDATIONS: Urology consult to evaluate RLQ pain, nausea and vomiting with hx of kidney stones - no clear GI cause found   eSigned:  Iva Boop, MD, [facility] 03/07/2013 3:11 PM

## 2013-03-07 NOTE — Progress Notes (Signed)
Pt is alert and oriented x 3,  Verbalized understanding of procedure in the AM, consent signed, pt was unable to complete the 2nd round of Movi prep, drank  about 16 oz and states she cannot drink any more. Pt has been NPO since midnight, no problems,  noted will continue,

## 2013-03-07 NOTE — Consult Note (Signed)
Consult: Abdominal pain, history of stones Requested by: Dr. Leone Payor  History of Present Illness: Patient has a one-week history of dull crampy abdominal pain centered about the umbilicus but it also radiates down to the left quadrant and also the right quadrant to some extent. The pain is moderate to severe. Nothing seems to make it better or worse. She's also had nausea and vomiting associated with it.  She is voiding without difficulty. She has occasional frequency. She has no dysuria or gross hematuria. She hasn't related the pain the bladder filling or emptying. She has not had flank pain and not passed any kidney stones. She has a history kidney stones and Dr. Laverle Patter performed ureteroscopy on the right January 2014.  She underwent CT scan of the abdomen and pelvis with contrast, ultrasound of the abdomen and a nuclear medicine biliary scan. All of these were normal apart from cholelithiasis. There is no hydronephrosis and no stones noted. I reviewed all the images.  She has a history of Crohn's disease and bowel resection. She underwent colonoscopy today. She had emesis this evening in her room.  Past Medical History  Diagnosis Date  . Depression   . Vasovagal syncope   . Anemia   . Nausea vomiting and diarrhea   . Cancer     basal cell back and face  . 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 Crohn's ileocolitis 11/04/2011   Past Surgical History  Procedure Laterality Date  . Tonsillectomy and adenoidectomy    . Skin cancer excision    . Ileocetomy  03/16/2012  . Laparoscopic ileocecectomy  03/16/2012    Procedure: LAPAROSCOPIC ILEOCECECTOMY;  Surgeon: [name], MD;  Location: [facility] OR;  Service: General;  Laterality: N/A;  lap assisted ileocectomy  . Holmium laser application  05/27/2012    Procedure: HOLMIUM LASER APPLICATION;  Surgeon: Crecencio Mc, MD;  Location: [facility] ORS;  Service: Urology;  Laterality: Right;     .  Cystoscopy with retrograde pyelogram, ureteroscopy and stent placement  05/27/2012    Procedure: CYSTOSCOPY WITH RETROGRADE PYELOGRAM, URETEROSCOPY AND STENT PLACEMENT;  Surgeon: Crecencio Mc, MD;  Location: [facility] ORS;  Service: Urology;  Laterality: Right;  . Esophagogastroduodenoscopy N/A 03/04/2013    Procedure: ESOPHAGOGASTRODUODENOSCOPY (EGD);  Surgeon: Beverley Fiedler, MD;  Location: [facility];  Service: Endoscopy;  Laterality: N/A;    Home Medications:  Prescriptions prior to admission  Medication Sig Dispense Refill  . adalimumab (HUMIRA PEN) 40 MG/0.8ML injection Inject 40 mg (1 pen) subcutaneous every 14 days following the completion of the starter kit.  2 each  3  . etonogestrel-ethinyl estradiol (NUVARING) 0.12-0.015 MG/24HR vaginal ring Place 1 each vaginally every 28 (twenty-eight) days. Insert vaginally and leave in place for 3 consecutive weeks, then remove for 1 week.       . hyoscyamine (LEVSIN SL) 0.125 MG SL tablet Take 1-2 tablets by mouth every 4 hours before meals as needed  100 tablet  11  . mercaptopurine (PURINETHOL) 50 MG tablet Take 1 tablet (50 mg total) by mouth daily. Give on an empty stomach 1 hour before or 2 hours after meals. Caution: Chemotherapy.  30 tablet  1  . Multiple Vitamin (MULTIVITAMIN WITH MINERALS) TABS tablet Take 1 tablet by mouth daily.      Marland Kitchen oxyCODONE (OXY IR/ROXICODONE) 5 MG immediate release tablet Take 1-2 tablets (5-10 mg total) by mouth every 4 (four) hours as needed for pain.  30 tablet  0  . [DISCONTINUED] traMADol (ULTRAM) 50 MG tablet Take 1 tablet (50 mg total) by mouth 4 (four) times daily.  120 tablet  1   Allergies:  Allergies  Allergen Reactions  . Prednisone     Blurred vision, ringing in ears, insomnia, shaky, body convulsions,nausea    Family History  Problem Relation Age of Onset  . Stroke Mother   . Breast cancer Mother   . Breast cancer Paternal Aunt   . Crohn's disease Father   . Colitis Maternal Aunt   . Colon cancer  Paternal Grandmother     ? cancer, part of colon removed   Social History:  reports that she has never smoked. She has never used smokeless tobacco. She reports that she does not drink alcohol or use illicit drugs.  ROS: A complete review of systems was performed.  All systems are negative except for pertinent findings as noted. ROS as per history of present illness otherwise negative.    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  Vital signs in last 24 hours: Temp:  [97.7 F (36.5 C)-98.5 F (36.9 C)] 97.7 F (36.5 C) (11/10 1548) Pulse Rate:  [77-99] 77 (11/10 1548) Resp:  [13-34] 18 (11/10 1548) BP: (102-126)/(56-81) 126/81 mmHg (11/10 1548) SpO2:  [95 %-100 %] 100 % (11/10 1548) General:  Alert and oriented, No acute distress HEENT: Normocephalic, atraumatic Neck: No JVD or lymphadenopathy Cardiovascular: Regular rate and rhythm Lungs: Regular rate and effort Abdomen: Soft, nondistended, no abdominal masses, mild tender to palpation about 2 cm inferior to the umbilicus and 2 cm lateral over toward the left side.  Back: No CVA tenderness Extremities: No edema Neurologic: Grossly intact  Laboratory Data:  No results found for this or any previous visit (from the past 24 hour(s)). Recent Results (from the past 240 hour(s))  URINE CULTURE     Status: None   Collection Time    03/01/13 10:53 PM      Result Value Range Status   Specimen Description URINE, CLEAN CATCH   Final   Special Requests NONE   Final   Culture  Setup Time     Final   Value: 03/02/2013 04:19     Performed at [facility] Count     Final   Value: NO GROWTH     Performed at [facility]   Culture     Final   Value: NO GROWTH     Performed at [facility]   Report Status 03/03/2013 FINAL   Final   UA squamous cells, 3-6 rbc, ca ox crystals Creatinine:  Recent Labs  03/01/13 1738 03/03/13 0334  CREATININE 0.74 0.60     Impression/Assessment:  Abdominal pain - apart from some calcium oxalate crystals noted on urinalysis, her history, exam and imaging are not consistent with a stone. Her kidney function is normal. Her urine culture is normal.  Plan:  No GU recommendations at this time. I will notify Dr. Laverle Patter of patient's admission so that he can follow.   Antony Haste 03/07/2013, 8:34 PM

## 2013-03-07 NOTE — Progress Notes (Signed)
In endoscopy. Agree with colonoscopy to try elucidate source of abd pain. Chart reviewed  Mary Sella. Andrey Campanile, MD, FACS General, Bariatric, & Minimally Invasive Surgery [facility], [place]

## 2013-03-07 NOTE — Transfer of Care (Signed)
Immediate Anesthesia Transfer of Care Note  Patient: Robin Miller  Procedure(s) Performed: Procedure(s): COLONOSCOPY (N/A)  Patient Location: PACU  Anesthesia Type:MAC  Level of Consciousness: awake, alert  and oriented  Airway & Oxygen Therapy: Patient Spontanous Breathing and Patient connected to face mask oxygen  Post-op Assessment: Report given to PACU RN and Post -op Vital signs reviewed and stable  Post vital signs: Reviewed and stable  Complications: No apparent anesthesia complications

## 2013-03-08 ENCOUNTER — Encounter (HOSPITAL_COMMUNITY): Payer: Self-pay | Admitting: Internal Medicine

## 2013-03-08 DIAGNOSIS — E440 Moderate protein-calorie malnutrition: Secondary | ICD-10-CM | POA: Diagnosis present

## 2013-03-08 MED ORDER — DICYCLOMINE HCL 20 MG PO TABS
20.0000 mg | ORAL_TABLET | Freq: Three times a day (TID) | ORAL | Status: DC
  Administered 2013-03-08 – 2013-03-11 (×9): 20 mg via ORAL
  Filled 2013-03-08 (×14): qty 1

## 2013-03-08 MED ORDER — ONDANSETRON HCL 8 MG PO TABS
8.0000 mg | ORAL_TABLET | Freq: Three times a day (TID) | ORAL | Status: DC
  Administered 2013-03-08 – 2013-03-11 (×9): 8 mg via ORAL
  Filled 2013-03-08 (×3): qty 1
  Filled 2013-03-08: qty 2
  Filled 2013-03-08 (×11): qty 1
  Filled 2013-03-08: qty 2

## 2013-03-08 NOTE — Progress Notes (Addendum)
    Progress Note   Subjective  still having waves of lower abdominal pain, mainly periumbilical radiating down into left lower quadrant and RLQ(to lesser degree). Vomited after dinner last night. Ate breakfast and nauseated but hasn't vomited    Objective   Vital signs in last 24 hours: Temp:  [97.7 F (36.5 C)-98.6 F (37 C)] 98 F (36.7 C) (11/11 0459) Pulse Rate:  [77-93] 82 (11/11 0459) Resp:  [13-34] 18 (11/11 0459) BP: (102-126)/(61-81) 102/68 mmHg (11/11 0459) SpO2:  [97 %-100 %] 99 % (11/11 0459) Last BM Date: 03/07/13 General:    Pleasant white female in NAD Heart:  Regular rate and rhythm Lungs: Respirations even and unlabored, lungs CTA bilaterally Abdomen:  Soft,  Nondistended, moderate periumbilical and LLQ pain. Normal bowel sounds. Extremities:  Without edema. Neurologic:  Alert and oriented,  grossly normal neurologically. Psych:  Cooperative. Normal mood and affect.   Assessment / Plan:     Crohn's disease, s/p ileocecectomy Nov 2013. She is on 6mp and Humira. Humira was due this past Thursday. Admitted with nausea, vomiting, diarrhea and lower abdominal pain. Diarrhea resolved but her pain and nausea have persisted. Extensive workup with CTscan, HIDA, and colonoscopy all nondiagnostic. She has gallstones but her abdominal pain is lower abdomen.  Urology evaluated her for abnormal u/a and history of ureteral stones. They have low index of suspicion for recurrent stone as source of her pain. Appreciate Urology's assistance. Unfortunately patient is still having frequent lower abdominal pain requiring frequent Dilaudid. She has no vaginal symptoms, reports being up to date on GYN exam. I see her last pap smear 02/27/12 was normal.     LOS: 7 days   Willette Cluster  03/08/2013, 9:51 AM   [facility] Attending  I have also seen and assessed the patient and agree with the above note. Cause of her sxs remains enigmatic - I am stopping 6 MP as that can cause nausea in  some people. Her ileal bxs showed Microscopic dz. If she is due for Humira need to give it. Will wait til AM to give as may need to discuss w/ pharmacy. She wants to go home but is frustrated re: cause of problems. I am considering capsule endo sb but not convinced that will make a difference in what we do. Do not think GB is problem. Schedule Zofran, add dicyclomine - consider Librax She also has protein-calorie malnutrition. Will ask dietitian to help with diet also.  Iva Boop, MD, [facility] [phone] (pager) 03/08/2013 6:20 PM

## 2013-03-08 NOTE — Progress Notes (Signed)
1 Day Post-Op  Subjective: She continues to complain of crampy and sharpe pain around the umbilicus and the LLQ.  It does not seem to be related to food, she has it before and after eating yesterday;  post colonoscopy.  Sometimes it goes to her back too.  She also complains of nausea also, again not really related to PO intake, she has had some clears yesterday and some mashed potatoes. Pain is primarily around her umbilicus, prior Crohn's pain was in the RLQ.  Objective: Vital signs in last 24 hours: Temp:  [97.7 F (36.5 C)-98.6 F (37 C)] 98 F (36.7 C) (11/11 0459) Pulse Rate:  [77-93] 82 (11/11 0459) Resp:  [13-34] 18 (11/11 0459) BP: (102-126)/(61-81) 102/68 mmHg (11/11 0459) SpO2:  [97 %-100 %] 99 % (11/11 0459) Last BM Date: 03/07/13 444 PO Afebrile, VSS No labs Intake/Output from previous day: 11/10 0701 - 11/11 0700 In: 3169 [P.O.:444; I.V.:2725] Out: -  Intake/Output this shift:    General appearance: alert, appears stated age, no distress and she seems rather despondant GI: soft, she complains of some generalized abdominal discomfort.  It is around the umbilicus, she complains more of the LLQ than any place else. on exam.  Abdomen is soft, BS hypoactive, no rebound, no distension, no peritonitis.  Lab Results:  No results found for this basename: WBC, HGB, HCT, PLT,  in the last 72 hours  BMET No results found for this basename: NA, K, CL, CO2, GLUCOSE, BUN, CREATININE, CALCIUM,  in the last 72 hours PT/INR No results found for this basename: LABPROT, INR,  in the last 72 hours   Recent Labs Lab 03/01/13 1738 03/04/13 0534  AST 13 10  ALT 7 5  ALKPHOS 43 45  BILITOT 0.2* 0.3  PROT 7.4 6.8  ALBUMIN 3.6 3.0*     Lipase     Component Value Date/Time   LIPASE 33 03/04/2013 0534     Studies/Results: No results found.  Medications: . acetaminophen  1,000 mg Oral TID  . feeding supplement (RESOURCE BREEZE)  1 Container Oral TID BM  . lip balm  1  application Topical BID  . mercaptopurine  50 mg Oral Daily  . multivitamin with minerals  1 tablet Oral Daily  . pantoprazole  40 mg Oral Q0600  . saccharomyces boulardii  250 mg Oral BID    Assessment/Plan Abdominal pain, nausea and vomiting of uncertain etiology  Crohn's disease s/p ileocecectomy 02/2012  EGD 03/04/13 Dr. Rhea Belton Minimal esophagitis from vomiting  Colonoscopy 03/07/13 - Dr. Leone Payor -normal biopsy pending No SBO 03/05/13 film  Gallstones, normal CBD, negative HIDA  Normal LFT'S, LIPASE AND WBC  Anemia (H/H=11.7/35.2)   PLan:  She does not really have symptoms consistent with biliary disease.  Her pain is less currently more on the left side than the right.  Dr. Andrey Campanile will see and discuss with GI later today.     LOS: 7 days    Coye Dawood 03/08/2013

## 2013-03-08 NOTE — Progress Notes (Addendum)
Patient ID: Robin Miller, female   DOB: [birth year], 22 y.o.   MRN: [id number]  1 Day Post-Op Subjective: I have reviewed Dr. Estil Daft note.  Patient complains primarily of LLQ pain with some left sided flank pain but also some pain in RLQ.  Pain has been persistent but increased intensity at times. No source identified.  She has associated nausea and vomiting. No hematuria.  Objective: Vital signs in last 24 hours: Temp:  [97.7 F (36.5 C)-98.6 F (37 C)] 98 F (36.7 C) (11/11 0459) Pulse Rate:  [77-93] 82 (11/11 0459) Resp:  [13-34] 18 (11/11 0459) BP: (102-126)/(61-81) 102/68 mmHg (11/11 0459) SpO2:  [97 %-100 %] 99 % (11/11 0459)  Intake/Output from previous day: 11/10 0701 - 11/11 0700 In: 3169 [P.O.:444; I.V.:2725] Out: -  Intake/Output this shift: Total I/O In: 855 [I.V.:855] Out: -   Physical Exam:  General: Alert and oriented Abdomen: Soft, ND, moderate tenderness in left CVA, LLQ, RLQ and R CVA (worst in LLQ and left flank). No rebound tenderness or guarding.   I reviewed recent CT scan and ultrasound from 11/5 and 11/7.  There is no hydronephrosis or other signs to suggest urolithiasis.  UA with minimal microscopic hematuria and calcium oxalate crystals  Assessment/Plan: Abdominal pain of uncertain etiology: My suspicion for a urologic source of her pain is very low. Her CT scan was performed with contrast only and so is not optimal for evaluation of a ureteral stone but in the absence of hydronephrosis, the suspicion for a ureteral stone would be extremely low. Considering the uncertainly of the cause of her stone, it is not unreasonable to repeat renal ultrasound to ensure hydronephrosis has not developed over past week.  The only way to absolutely rule out a ureteral stone would be a non-contrast stone protocol CT scan although with a low suspicion, I would try to avoid excessive radiation in this young patient unless no other source can be identified.   LOS: 7  days   Robin Miller,LES 03/08/2013, 8:20 AM

## 2013-03-09 ENCOUNTER — Inpatient Hospital Stay (HOSPITAL_COMMUNITY): Payer: BC Managed Care – PPO

## 2013-03-09 ENCOUNTER — Inpatient Hospital Stay (HOSPITAL_COMMUNITY): Payer: BC Managed Care – PPO | Admitting: Anesthesiology

## 2013-03-09 ENCOUNTER — Encounter (HOSPITAL_COMMUNITY): Payer: BC Managed Care – PPO | Admitting: Anesthesiology

## 2013-03-09 ENCOUNTER — Encounter (HOSPITAL_COMMUNITY)
Admission: AD | Disposition: A | Discharge status: Home / Self Care | Payer: Self-pay | Source: Ambulatory Visit | Attending: Gastroenterology

## 2013-03-09 DIAGNOSIS — K8010 Calculus of gallbladder with chronic cholecystitis without obstruction: Secondary | ICD-10-CM | POA: Diagnosis not present

## 2013-03-09 DIAGNOSIS — K824 Cholesterolosis of gallbladder: Secondary | ICD-10-CM

## 2013-03-09 DIAGNOSIS — R112 Nausea with vomiting, unspecified: Secondary | ICD-10-CM | POA: Diagnosis not present

## 2013-03-09 HISTORY — PX: CHOLECYSTECTOMY: SHX55

## 2013-03-09 LAB — SURGICAL PCR SCREEN: Staphylococcus aureus: INVALID — AB

## 2013-03-09 SURGERY — LAPAROSCOPIC CHOLECYSTECTOMY WITH INTRAOPERATIVE CHOLANGIOGRAM
Anesthesia: General | Site: Abdomen | Wound class: Clean Contaminated

## 2013-03-09 MED ORDER — PROMETHAZINE HCL 25 MG/ML IJ SOLN
6.2500 mg | INTRAMUSCULAR | Status: AC | PRN
  Administered 2013-03-09 (×2): 6.25 mg via INTRAVENOUS

## 2013-03-09 MED ORDER — NEOSTIGMINE METHYLSULFATE 1 MG/ML IJ SOLN
INTRAMUSCULAR | Status: DC | PRN
  Administered 2013-03-09: 4 mg via INTRAVENOUS

## 2013-03-09 MED ORDER — CISATRACURIUM BESYLATE (PF) 10 MG/5ML IV SOLN
INTRAVENOUS | Status: DC | PRN
  Administered 2013-03-09: 5 mg via INTRAVENOUS

## 2013-03-09 MED ORDER — MEPERIDINE HCL 50 MG/ML IJ SOLN: 6.2500 mg | INTRAMUSCULAR | Status: DC | PRN

## 2013-03-09 MED ORDER — OXYCODONE HCL 5 MG PO TABS
5.0000 mg | ORAL_TABLET | ORAL | Status: DC | PRN
  Administered 2013-03-10: 5 mg via ORAL
  Administered 2013-03-10 – 2013-03-11 (×3): 10 mg via ORAL
  Filled 2013-03-09: qty 1
  Filled 2013-03-09 (×3): qty 2

## 2013-03-09 MED ORDER — GLYCOPYRROLATE 0.2 MG/ML IJ SOLN
INTRAMUSCULAR | Status: DC | PRN
  Administered 2013-03-09: .6 mg via INTRAVENOUS

## 2013-03-09 MED ORDER — SUCCINYLCHOLINE CHLORIDE 20 MG/ML IJ SOLN
INTRAMUSCULAR | Status: DC | PRN
  Administered 2013-03-09: 100 mg via INTRAVENOUS

## 2013-03-09 MED ORDER — LIDOCAINE HCL (CARDIAC) 20 MG/ML IV SOLN
INTRAVENOUS | Status: DC | PRN
  Administered 2013-03-09: 30 mg via INTRAVENOUS

## 2013-03-09 MED ORDER — LACTATED RINGERS IV SOLN
INTRAVENOUS | Status: DC | PRN
  Administered 2013-03-09: 14:00:00 via INTRAVENOUS

## 2013-03-09 MED ORDER — PROMETHAZINE HCL 25 MG/ML IJ SOLN
INTRAMUSCULAR | Status: AC
  Filled 2013-03-09: qty 1

## 2013-03-09 MED ORDER — HYDROMORPHONE HCL PF 1 MG/ML IJ SOLN
0.2500 mg | INTRAMUSCULAR | Status: DC | PRN
  Administered 2013-03-09 (×2): 0.5 mg via INTRAVENOUS

## 2013-03-09 MED ORDER — HYDROMORPHONE HCL PF 1 MG/ML IJ SOLN
INTRAMUSCULAR | Status: AC
  Filled 2013-03-09: qty 1

## 2013-03-09 MED ORDER — FENTANYL CITRATE 0.05 MG/ML IJ SOLN
INTRAMUSCULAR | Status: DC | PRN
  Administered 2013-03-09 (×5): 50 ug via INTRAVENOUS

## 2013-03-09 MED ORDER — ONDANSETRON HCL 4 MG/2ML IJ SOLN
INTRAMUSCULAR | Status: DC | PRN
  Administered 2013-03-09 (×2): 2 mg via INTRAVENOUS

## 2013-03-09 MED ORDER — BUPIVACAINE-EPINEPHRINE 0.25% -1:200000 IJ SOLN
INTRAMUSCULAR | Status: AC
  Filled 2013-03-09: qty 1

## 2013-03-09 MED ORDER — OXYCODONE HCL 5 MG/5ML PO SOLN
5.0000 mg | Freq: Once | ORAL | Status: DC | PRN
  Filled 2013-03-09: qty 5

## 2013-03-09 MED ORDER — 0.9 % SODIUM CHLORIDE (POUR BTL) OPTIME
TOPICAL | Status: DC | PRN
  Administered 2013-03-09: 1000 mL

## 2013-03-09 MED ORDER — MIDAZOLAM HCL 5 MG/5ML IJ SOLN
INTRAMUSCULAR | Status: DC | PRN
  Administered 2013-03-09 (×2): 1 mg via INTRAVENOUS

## 2013-03-09 MED ORDER — LACTATED RINGERS IR SOLN
Status: DC | PRN
  Administered 2013-03-09: 1

## 2013-03-09 MED ORDER — DEXTROSE 5 % IV SOLN
2.0000 g | INTRAVENOUS | Status: AC
  Administered 2013-03-09: 2 g via INTRAVENOUS
  Filled 2013-03-09: qty 2

## 2013-03-09 MED ORDER — PROPOFOL 10 MG/ML IV BOLUS
INTRAVENOUS | Status: DC | PRN
  Administered 2013-03-09: 120 mg via INTRAVENOUS

## 2013-03-09 MED ORDER — OXYCODONE HCL 5 MG PO TABS: 5.0000 mg | ORAL_TABLET | Freq: Once | ORAL | Status: DC | PRN

## 2013-03-09 MED ORDER — ADALIMUMAB 40 MG/0.8ML ~~LOC~~ KIT: 40.0000 mg | PACK | Freq: Once | SUBCUTANEOUS | Status: DC

## 2013-03-09 MED ORDER — LACTATED RINGERS IV SOLN
INTRAVENOUS | Status: DC
  Administered 2013-03-09: 1000 mL via INTRAVENOUS
  Administered 2013-03-09: 1 via INTRAVENOUS

## 2013-03-09 MED ORDER — DEXTROSE 5 % IV SOLN
INTRAVENOUS | Status: AC
  Filled 2013-03-09 (×2): qty 1

## 2013-03-09 MED ORDER — BUPIVACAINE-EPINEPHRINE 0.25% -1:200000 IJ SOLN
INTRAMUSCULAR | Status: DC | PRN
  Administered 2013-03-09: 26 mL

## 2013-03-09 SURGICAL SUPPLY — 38 items
APPLIER CLIP 5 13 M/L LIGAMAX5 (MISCELLANEOUS) ×2
APPLIER CLIP ROT 10 11.4 M/L (STAPLE)
BANDAGE ADHESIVE 1X3 (GAUZE/BANDAGES/DRESSINGS) ×2 IMPLANT
BENZOIN TINCTURE PRP APPL 2/3 (GAUZE/BANDAGES/DRESSINGS) ×2 IMPLANT
CANISTER SUCTION 2500CC (MISCELLANEOUS) IMPLANT
CHLORAPREP W/TINT 26ML (MISCELLANEOUS) ×2 IMPLANT
CLIP APPLIE 5 13 M/L LIGAMAX5 (MISCELLANEOUS) ×1 IMPLANT
CLIP APPLIE ROT 10 11.4 M/L (STAPLE) IMPLANT
CLOTH BEACON ORANGE TIMEOUT ST (SAFETY) IMPLANT
COVER MAYO STAND STRL (DRAPES) IMPLANT
COVER SURGICAL LIGHT HANDLE (MISCELLANEOUS) IMPLANT
DECANTER SPIKE VIAL GLASS SM (MISCELLANEOUS) ×2 IMPLANT
DRAPE C-ARM 42X120 X-RAY (DRAPES) IMPLANT
DRAPE LAPAROSCOPIC ABDOMINAL (DRAPES) ×2 IMPLANT
DRAPE UTILITY XL STRL (DRAPES) ×2 IMPLANT
DRSG TEGADERM 2-3/8X2-3/4 SM (GAUZE/BANDAGES/DRESSINGS) ×2 IMPLANT
ELECT REM PT RETURN 9FT ADLT (ELECTROSURGICAL) ×2
ELECTRODE REM PT RTRN 9FT ADLT (ELECTROSURGICAL) ×1 IMPLANT
GLOVE BIO SURGEON STRL SZ7.5 (GLOVE) ×2 IMPLANT
GLOVE BIOGEL M STRL SZ7.5 (GLOVE) ×2 IMPLANT
GLOVE BIOGEL PI IND STRL 7.0 (GLOVE) ×2 IMPLANT
GLOVE BIOGEL PI INDICATOR 7.0 (GLOVE) ×2
GLOVE INDICATOR 8.0 STRL GRN (GLOVE) IMPLANT
GOWN PREVENTION PLUS LG XLONG (DISPOSABLE) ×2 IMPLANT
GOWN STRL REIN XL XLG (GOWN DISPOSABLE) ×6 IMPLANT
KIT BASIN OR (CUSTOM PROCEDURE TRAY) ×2 IMPLANT
NS IRRIG 1000ML POUR BTL (IV SOLUTION) ×2 IMPLANT
POUCH SPECIMEN RETRIEVAL 10MM (ENDOMECHANICALS) ×2 IMPLANT
SET CHOLANGIOGRAPH MIX (MISCELLANEOUS) IMPLANT
SET IRRIG TUBING LAPAROSCOPIC (IRRIGATION / IRRIGATOR) IMPLANT
SOLUTION ANTI FOG 6CC (MISCELLANEOUS) ×2 IMPLANT
STRIP CLOSURE SKIN 1/2X4 (GAUZE/BANDAGES/DRESSINGS) ×2 IMPLANT
SUT MNCRL AB 4-0 PS2 18 (SUTURE) ×2 IMPLANT
TOWEL OR 17X26 10 PK STRL BLUE (TOWEL DISPOSABLE) ×2 IMPLANT
TRAY LAP CHOLE (CUSTOM PROCEDURE TRAY) ×2 IMPLANT
TROCAR BLADELESS OPT 5 75 (ENDOMECHANICALS) ×6 IMPLANT
TROCAR XCEL BLUNT TIP 100MML (ENDOMECHANICALS) ×2 IMPLANT
TUBING INSUFFLATION 10FT LAP (TUBING) ×2 IMPLANT

## 2013-03-09 NOTE — Progress Notes (Signed)
    Progress Note   Subjective  vomited last night but overall phenergan improving things. Still have same periumbilical pain radiating down into LLQ and around left lower back   Objective   Vital signs in last 24 hours: Temp:  [98.3 F (36.8 C)-98.8 F (37.1 C)] 98.8 F (37.1 C) (11/12 0521) Pulse Rate:  [87-102] 94 (11/12 0521) Resp:  [16] 16 (11/12 0521) BP: (94-101)/(53-63) 100/53 mmHg (11/12 0521) SpO2:  [95 %-98 %] 95 % (11/12 0521) Last BM Date: 03/07/13 General:    white female in NAD Heart:  Regular rate and rhythm Lungs: Respirations even and unlabored, lungs CTA bilaterally Abdomen:  Soft, moderate diffuse mid and lower abdominal tenderness. Normal bowel sounds. Extremities:  Without edema. Neurologic:  Alert and oriented,  grossly normal neurologically. Psych:  Cooperative. Normal mood and affect.    Assessment / Plan:   Crohn's disease, s/p ileocecectomy Nov 2013. Admitted with nausea, vomiting, diarrhea and mid to lower abdominal pain. She is overdue for Humira, I have spoken with pharmacy and she should be able to get a dose today. Diarrhea has resolved. We stopped 6mp yesterday in case it was contributing to nausea. Extensive workup with CTscan, HIDA, and colonoscopy all nondiagnostic. She has gallstones but her abdominal pain is lower. Perhaps the nausea and vomiting are secondary to gallbladder disease. Surgery planning cholecystectomy, patient agreeable.     LOS: 8 days   Willette Cluster  03/09/2013, 9:36 AM   Agree with Ms. Dorice Lamas assessment and plan. Iva Boop, MD, [facility]

## 2013-03-09 NOTE — Progress Notes (Signed)
Pt seen and examined last night around 7:35pm  Chart reviewed. Several dy h/o of primarily periumbilical and L sided abd pain along with nausea. Pain getting a little better. Tolerating some food but gets very nauseous with po and at other times. Nothing similar in past  Alert, nad Soft, well healed trocar sites, nd. Mild left sided and periumbilical TTP - no rt/guarding No jaundice/icterus/rash  H/o of Crohns Gallstones Periumbilical and Left sided abd pain Nausea  Reviewed hida, u/s, colonoscopy, other imaging; labs Scope was negative for stricture or active disease Source of pain is unclear GB could be contributing some to nausea but i doubt is causing left sided pain  Since majority of w/u negative except for finding of gallstones i told her i would offer a cholecystectomy however i explained i think the chances of it getting rid of her current symptoms is less than 50%. It may help some with her nausea.   I believe the patient's symptoms are consistent with gallbladder disease.  We discussed gallbladder disease. The patient was given Agricultural engineer. We discussed non-operative and operative management. We discussed the signs & symptoms of acute cholecystitis  I discussed laparoscopic cholecystectomy  in detail.  We discussed the risks and benefits of a laparoscopic cholecystectomy including, but not limited to bleeding, infection, injury to surrounding structures such as the intestine or liver, bile leak, retained gallstones, need to convert to an open procedure, prolonged diarrhea, blood clots such as  DVT, common bile duct injury, failure to ameliorate her symptoms, anesthesia risks, and possible need for additional procedures.  We discussed the typical post-operative recovery course. I explained that the likelihood of improvement of their symptoms is less than 50%.  She wants to discuss with her dad but is leaning toward surgery.  If she decides to proceed the earliest i could  perform would probably be Thursday. Did offer her outpt f/u with Dr Michaell Cowing to discuss outpt cholecystectomy but she would rather get it done while in hospital  Robin Miller. Andrey Campanile, MD, FACS General, Bariatric, & Minimally Invasive Surgery [facility] Surgery, [place]

## 2013-03-09 NOTE — Anesthesia Preprocedure Evaluation (Signed)
Anesthesia Evaluation  Patient identified by MRN, date of birth, ID band Patient awake    Reviewed: Allergy & Precautions, H&P , NPO status , Patient's Chart, lab work & pertinent test results, reviewed documented beta blocker date and time   Airway Mallampati: II TM Distance: >3 FB Neck ROM: full    Dental  (+) Teeth Intact and Dental Advisory Given   Pulmonary neg pulmonary ROS,  breath sounds clear to auscultation  Pulmonary exam normal       Cardiovascular negative cardio ROS  Rhythm:regular Rate:Normal     Neuro/Psych  Headaches, PSYCHIATRIC DISORDERS Depression    GI/Hepatic negative GI ROS, Neg liver ROS,   Endo/Other  negative endocrine ROS  Renal/GU Renal diseasenegative Renal ROS     Musculoskeletal   Abdominal   Peds  Hematology  (+) anemia ,   Anesthesia Other Findings   Reproductive/Obstetrics negative OB ROS                           Anesthesia Physical  Anesthesia Plan  ASA: II  Anesthesia Plan: General   Post-op Pain Management:    Induction: Intravenous  Airway Management Planned: Oral ETT  Additional Equipment:   Intra-op Plan:   Post-operative Plan: Extubation in OR  Informed Consent: I have reviewed the patients History and Physical, chart, labs and discussed the procedure including the risks, benefits and alternatives for the proposed anesthesia with the patient or authorized representative who has indicated his/her understanding and acceptance.   Dental advisory given  Plan Discussed with: CRNA  Anesthesia Plan Comments:         Anesthesia Quick Evaluation

## 2013-03-09 NOTE — Op Note (Signed)
SHINA WASS [id number] 08/06/1990 03/09/2013  Laparoscopic Cholecystectomy Procedure Note  Indications: This patient presents with gallstones and nausea and will undergo laparoscopic cholecystectomy. Please see preoperative notes for further discussion  Pre-operative Diagnosis: gallstones, nausea  Post-operative Diagnosis: Same  Surgeon: Atilano Ina   Assistants: none  Anesthesia: General endotracheal anesthesia  ASA Class: 2  Procedure Details  The patient was seen again in the Holding Room. The risks, benefits, complications, treatment options, and expected outcomes were discussed with the patient. The possibilities of reaction to medication, pulmonary aspiration, perforation of viscus, bleeding, recurrent infection, finding a normal gallbladder, the need for additional procedures, failure to diagnose a condition, the possible need to convert to an open procedure, and creating a complication requiring transfusion or operation were discussed with the patient. The likelihood of improving the patient's symptoms with return to their baseline status is good.  The patient and/or family concurred with the proposed plan, giving informed consent. The site of surgery properly noted. The patient was taken to Operating Room, identified as MARGARETTE VANNATTER and the procedure verified as Laparoscopic Cholecystectomy. A Time Out was held and the above information confirmed.  Prior to the induction of general anesthesia, antibiotic prophylaxis was administered. General endotracheal anesthesia was then administered and tolerated well. After the induction, the abdomen was prepped with Chloraprep and draped in the sterile fashion. The patient was positioned in the supine position.  Local anesthetic agent was injected into the skin near the umbilicus and an incision made. We dissected down to the abdominal fascia with blunt dissection.  The fascia was incised vertically and we entered the peritoneal cavity  bluntly.  A pursestring suture of 0-Vicryl was placed around the fascial opening.  The Hasson cannula was inserted and secured with the stay suture.  Pneumoperitoneum was then created with CO2 and tolerated well without any adverse changes in the patient's vital signs. An 5-mm port was placed in the subxiphoid position.  Two 5-mm ports were placed in the right upper quadrant. All skin incisions were infiltrated with a local anesthetic agent before making the incision and placing the trocars.   We positioned the patient in reverse Trendelenburg, tilted slightly to the patient's left.  The gallbladder was identified, the fundus grasped and retracted cephalad. Adhesions were lysed bluntly and with the electrocautery where indicated, taking care not to injure any adjacent organs or viscus. The infundibulum was grasped and retracted laterally, exposing the peritoneum overlying the triangle of Calot. This was then divided and exposed in a blunt fashion. A critical view of the cystic duct and cystic artery was obtained.  The cystic duct was clearly identified and bluntly dissected circumferentially. The cystic duct was ligated with 3 clips on the biliary aspect and 1 clip distally as the duct entered the gallbladder.   The cystic duct was then divided. The cystic artery (both a small anterior branch and a posterior branch) which had been already identified, were ligated with clips and divided as well.   The gallbladder was dissected from the liver bed in retrograde fashion with the electrocautery. The gallbladder was removed and placed in an Endocatch sac.  The gallbladder and Endocatch sac were then removed through the umbilical port site. The liver bed was irrigated and inspected. Hemostasis was achieved with the electrocautery. Copious irrigation was utilized and was repeatedly aspirated until clear.  Because of the patients complaints of periumbilical, LLQ, and RLQ pain, I looked in these areas. There were a few  thin see thru  omental adhesions around the umbilicus and lower midline which were taken down with endoshears. The previous ileocolonic anastomosis was inspected and appeared grossly normal without signs of stricture or abnormal position. The pursestring suture was used to close the umbilical fascia.    We again inspected the right upper quadrant for hemostasis.  The umbilical closure was inspected and there was no air leak and nothing trapped within the closure. Pneumoperitoneum was released as we removed the trocars.  4-0 Monocryl was used to close the skin.   Dermabond was applied. The patient was then extubated and brought to the recovery room in stable condition. Instrument, sponge, and needle counts were correct at closure and at the conclusion of the case.   Findings: Grossly normal appearing GB. +critical view. A few thin omental adhesions which were lysed around the umbilicus and lower midline  Estimated Blood Loss: Minimal         Drains: none         Specimens: Gallbladder           Complications: None; patient tolerated the procedure well.         Disposition: PACU - hemodynamically stable.         Condition: stable  Mary Sella. Andrey Campanile, MD, FACS General, Bariatric, & Minimally Invasive Surgery [facility] Surgery, [place]

## 2013-03-09 NOTE — Progress Notes (Signed)
Patient ID: Robin Miller, female   DOB: 1990-09-05, 22 y.o.   MRN: [id number]  2 Days Post-Op Subjective: Pain remains in LLQ primarily and unchanged from yesterday. Still with significant nausea.  Objective: Vital signs in last 24 hours: Temp:  [98.3 F (36.8 C)-98.8 F (37.1 C)] 98.8 F (37.1 C) (11/12 0521) Pulse Rate:  [87-102] 94 (11/12 0521) Resp:  [16] 16 (11/12 0521) BP: (94-101)/(53-63) 100/53 mmHg (11/12 0521) SpO2:  [95 %-98 %] 95 % (11/12 0521)  Intake/Output from previous day: 11/11 0701 - 11/12 0700 In: 2485 [P.O.:760; I.V.:1725] Out: 0  Intake/Output this shift:    Physical Exam:  General: Alert and oriented Abd: Moderate L CVAT and LLQ tenderness, Mild RLQ tenderness and R CVAT  Studies/Results: No results found.  Assessment/Plan: - Will order repeat renal ultrasound.  If no development of hydronephrosis since last ultrasound, it would be highly unlikely that she would have a ureteral stone as cause for pain.    LOS: 8 days   Yassin Scales,LES 03/09/2013, 6:49 AM

## 2013-03-09 NOTE — Progress Notes (Signed)
2 Days Post-Op  Subjective: Still with nausea, and intermittent abd pain  Objective: Vital signs in last 24 hours: Temp:  [98.3 F (36.8 C)-98.8 F (37.1 C)] 98.8 F (37.1 C) (11/12 0521) Pulse Rate:  [87-102] 94 (11/12 0521) Resp:  [16] 16 (11/12 0521) BP: (94-101)/(53-63) 100/53 mmHg (11/12 0521) SpO2:  [95 %-98 %] 95 % (11/12 0521) Last BM Date: 03/07/13  Intake/Output from previous day: 11/11 0701 - 11/12 0700 In: 2485 [P.O.:760; I.V.:1725] Out: 0  Intake/Output this shift:    Alert, nad Soft, nd. Mild TTP around umbilicus and left side  Lab Results:  No results found for this basename: WBC, HGB, HCT, PLT,  in the last 72 hours BMET No results found for this basename: NA, K, CL, CO2, GLUCOSE, BUN, CREATININE, CALCIUM,  in the last 72 hours PT/INR No results found for this basename: LABPROT, INR,  in the last 72 hours ABG No results found for this basename: PHART, PCO2, PO2, HCO3,  in the last 72 hours  Studies/Results: No results found.  Anti-infectives: Anti-infectives   None      Assessment/Plan: s/p Procedure(s): COLONOSCOPY (N/A)  Gallstones Nausea abd pain H/o crohns  See note from last night regarding discussion/thought process Pt agrees with cholecystectomy and understands if may not ameliorate her abd pain or nausea.  To or later today for lap chole with ioc All questions asked and answered  Robin Miller. Andrey Campanile, MD, FACS General, Bariatric, & Minimally Invasive Surgery [facility] Surgery, [place]   LOS: 8 days    Robin Miller 03/09/2013

## 2013-03-09 NOTE — Anesthesia Postprocedure Evaluation (Signed)
Anesthesia Post Note  Patient: Robin Miller  Procedure(s) Performed: Procedure(s) (LRB): LAPAROSCOPIC CHOLECYSTECTOMY (N/A)  Anesthesia type: General  Patient location: PACU  Post pain: Pain level controlled  Post assessment: Post-op Vital signs reviewed  Last Vitals: BP 118/77  Pulse 80  Temp(Src) 36.6 C (Oral)  Resp 18  Ht 5\' 3"  (1.6 m)  Wt 119 lb (53.978 kg)  BMI 21.09 kg/m2  SpO2 100%  LMP 01/29/2013  Post vital signs: Reviewed  Level of consciousness: sedated  Complications: No apparent anesthesia complications

## 2013-03-09 NOTE — Progress Notes (Signed)
NUTRITION FOLLOW UP/CONSULT NOTE  Intervention:   - Diet advancement per MD - Educated pt on bland diet for when diet advanced - Recommend MD monitor I/Os, documentation shows pt +18.7L since admit  - Recommend nursing get updated weight (no new weights since admission) - Will continue to monitor   Nutrition Dx:   Inadequate oral intake related to clear liquid diet as evidenced by diet order - ongoing but now related to NPO for surgery   Goal:   1. No further nausea/vomiting/diarrhea - vomiting/diarrhea resolved, nausea persists 2. Advance diet as tolerated to regular diet - not met   Monitor:   Weights, labs, diet advancement, nausea, abdominal pain/cramping  Assessment:   Pt with hx of fistulizing Crohn's ileocolitis and ileocecectomy. Pt admitted with acute diffuse abdominal pain, nausea, vomiting, and diarrhea that started 11/4. Reports 20 pound unintended weight loss in the past 2 months r/t having diarrhea 3 days out of the week which occurs in episodes lasting for a few hours, however weight trend showed pt's weight up 12 pounds in the past 2 months.   GI, surgery, and urology following. S/p EGD 11/7 which showed class A esophagitis likely r/t recent vomiting. Pt was started on daily PPI. S/p colonoscopy 11/10 which showed evidence of a prior ileocolonic surgical anastomosis in right colon. Urology consult recommended to evaluate RLQ as no clear GI cause found. Urology had no recommendations and noted pt with normal kidney function and urine culture. Cholecystectomy has been discussed and is planned for today.   Met with pt who reports having nausea after eating, even just after 1 bite of food, but the medications typically keep it under control to prevent her from vomiting. Last emesis occurred Monday night but she reports she had a little bit yesterday that was bile. States she has been eating 1/3 of her meals and drinking 100% of her Resource Breeze (each contains 250 calories, 9g  protein). Her abdominal pain and cramping are still "very present" per pt and the abdominal pain is worse after eating. States the Phenergan is helping her more with her nausea than the Zofran, states the bag of Zofran was helping her when she was nauseated on an empty stomach but the Phenergan helps the nausea after she has been eating. Reports she has been up and walking. Has been drinking water and coca-cola the past few days. Not anxious about surgery today, says it's heard 3rd surgery of the year so she's used to it. Pt hopeful that cholecystectomy will help her feel better.    Height: Ht Readings from Last 1 Encounters:  03/01/13 5\' 3"  (1.6 m)    Weight Status:   Wt Readings from Last 1 Encounters:  03/01/13 119 lb (53.978 kg)  Admit wt: 119 lb Net I/O since admit: +18.7 L  Re-estimated needs:  Kcal: 1350-1550 Protein: 55-65g Fluid: 1.3-1.5L/day  Skin: Intact   Diet Order: NPO   Intake/Output Summary (Last 24 hours) at 03/09/13 0854 Last data filed at 03/09/13 0523  Gross per 24 hour  Intake   1350 ml  Output      0 ml  Net   1350 ml    Last BM: 11/10   Labs:   Recent Labs Lab 03/03/13 0334  NA 138  K 3.7  CL 109  CO2 18*  BUN 7  CREATININE 0.60  CALCIUM 8.5  GLUCOSE 102*    CBG (last 3)  No results found for this basename: GLUCAP,  in the last 72  hours  Scheduled Meds: . acetaminophen  1,000 mg Oral TID  . dicyclomine  20 mg Oral TID AC & HS  . feeding supplement (RESOURCE BREEZE)  1 Container Oral TID BM  . lip balm  1 application Topical BID  . multivitamin with minerals  1 tablet Oral Daily  . ondansetron  8 mg Oral TID AC & HS  . pantoprazole  40 mg Oral Q0600  . saccharomyces boulardii  250 mg Oral BID    Continuous Infusions: . 0.9 % NaCl with KCl 20 mEq / L 75 mL/hr at 03/08/13 2135    Levon Hedger MS, RD, LDN [phone] Pager [phone] After Hours Pager

## 2013-03-09 NOTE — Transfer of Care (Signed)
Immediate Anesthesia Transfer of Care Note  Patient: Robin Miller  Procedure(s) Performed: Procedure(s): LAPAROSCOPIC CHOLECYSTECTOMY (N/A)  Patient Location: PACU  Anesthesia Type:General  Level of Consciousness: awake, alert , oriented and patient cooperative  Airway & Oxygen Therapy: Patient Spontanous Breathing and Patient connected to face mask oxygen  Post-op Assessment: Report given to PACU RN and Post -op Vital signs reviewed and stable  Post vital signs: Reviewed and stable  Complications: No apparent anesthesia complications

## 2013-03-10 ENCOUNTER — Inpatient Hospital Stay (HOSPITAL_COMMUNITY): Payer: BC Managed Care – PPO

## 2013-03-10 ENCOUNTER — Encounter (HOSPITAL_COMMUNITY): Payer: Self-pay | Admitting: General Surgery

## 2013-03-10 LAB — URINALYSIS, ROUTINE W REFLEX MICROSCOPIC
Bilirubin Urine: NEGATIVE
Glucose, UA: NEGATIVE mg/dL
Hgb urine dipstick: NEGATIVE
Ketones, ur: NEGATIVE mg/dL
Leukocytes, UA: NEGATIVE
Protein, ur: NEGATIVE mg/dL
Urobilinogen, UA: 0.2 mg/dL (ref 0.0–1.0)
pH: 6.5 (ref 5.0–8.0)

## 2013-03-10 NOTE — Progress Notes (Signed)
Says she feels a lot better. Minimal nausea.   Soft, nd. Incisions c/d/i. Mild TTP  S/p lap chole and adhesiolysis (few) Path - chronic cholecystitis, gallstones Ok to d/c from our POV Discussed d/c instructions with pt  Robin Miller. Andrey Campanile, MD, FACS General, Bariatric, & Minimally Invasive Surgery [facility] Surgery, [place]

## 2013-03-10 NOTE — Progress Notes (Signed)
Patient ID: Robin Miller, female   DOB: 23-Nov-1990, 23 y.o.   MRN: [id number]  1 Day Post-Op Subjective: Pt underwent a cholecystectomy and adhesiolysis yesterday.  Now feeling improved. Nausea is definitely improved.  Pain also slightly better.  Objective: Vital signs in last 24 hours: Temp:  [97.5 F (36.4 C)-98.6 F (37 C)] 97.8 F (36.6 C) (11/13 2123) Pulse Rate:  [83-96] 92 (11/13 2123) Resp:  [18] 18 (11/13 2123) BP: (92-103)/(50-67) 93/67 mmHg (11/13 2123) SpO2:  [97 %-100 %] 98 % (11/13 2123)  Intake/Output from previous day: 11/12 0701 - 11/13 0700 In: 3345 [P.O.:240; I.V.:3105] Out: -  Intake/Output this shift:    Physical Exam:  General: Alert and oriented Abd: CVAT minimal    Studies/Results: US Renal  03/10/2013   CLINICAL DATA:  Evaluate for hydronephrosis.  EXAM: RENAL/URINARY TRACT ULTRASOUND COMPLETE  COMPARISON:  Abdominal ultrasound 03/03/2013; CT 03/02/2013  FINDINGS: Right Kidney  Length: Measures 11.2 cm. Echogenicity within normal limits. No mass or hydronephrosis visualized.  Left Kidney  Length: Measures 10.8 cm. Echogenicity within normal limits. No mass or hydronephrosis visualized.  Bladder  Minimal layering debris within the bladder lumen.  IMPRESSION: 1. No hydronephrosis 2. Minimal layering debris within the bladder lumen. Recommend correlation with urinalysis to exclude urinary tract infection.   Electronically Signed   By: Robin Miller M.D.   On: 03/10/2013 11:28    Assessment/Plan: - No hydro on ultrasound.  Do no have suspicion for ureteral stone or other urologic cause for symptoms. Bladder findings are clinically insignificant in absence of negative UA.  Will sign off.   LOS: 9 days   Robin Miller,LES 03/10/2013, 10:13 PM

## 2013-03-10 NOTE — Progress Notes (Signed)
1 Day Post-Op  Subjective: Pt thinks her pain is much improved since the surgery.  She c/o some nausea which is improved from yesterday.  Tolerating clear liquids.  Pending renal US per primary service.  No vomiting.  +flatus, no BM yet.    Objective: Vital signs in last 24 hours: Temp:  [97.9 F (36.6 C)-98.6 F (37 C)] 98.6 F (37 C) (11/13 0532) Pulse Rate:  [71-105] 96 (11/13 0532) Resp:  [17-19] 18 (11/13 0532) BP: (92-126)/(50-77) 92/50 mmHg (11/13 0532) SpO2:  [97 %-100 %] 97 % (11/13 0532) Last BM Date: 03/07/13  Intake/Output from previous day: 11/12 0701 - 11/13 0700 In: 3345 [P.O.:240; I.V.:3105] Out: -  Intake/Output this shift:    PE: Gen:  Alert, NAD, pleasant Abd: Soft, appropriately tender over the incisions, ND, +BS, no HSM, incisions C/D/I with dermabond   Lab Results:  No results found for this basename: WBC, HGB, HCT, PLT,  in the last 72 hours BMET No results found for this basename: NA, K, CL, CO2, GLUCOSE, BUN, CREATININE, CALCIUM,  in the last 72 hours PT/INR No results found for this basename: LABPROT, INR,  in the last 72 hours CMP     Component Value Date/Time   NA 138 03/03/2013 0334   K 3.7 03/03/2013 0334   CL 109 03/03/2013 0334   CO2 18* 03/03/2013 0334   GLUCOSE 102* 03/03/2013 0334   BUN 7 03/03/2013 0334   CREATININE 0.60 03/03/2013 0334   CALCIUM 8.5 03/03/2013 0334   PROT 6.8 03/04/2013 0534   ALBUMIN 3.0* 03/04/2013 0534   AST 10 03/04/2013 0534   ALT 5 03/04/2013 0534   ALKPHOS 45 03/04/2013 0534   BILITOT 0.3 03/04/2013 0534   GFRNONAA >90 03/03/2013 0334   GFRAA >90 03/03/2013 0334   Lipase     Component Value Date/Time   LIPASE 33 03/04/2013 0534       Studies/Results: No results found.  Anti-infectives: Anti-infectives   Start     Dose/Rate Route Frequency Ordered Stop   03/10/13 0600  cefOXitin (MEFOXIN) 2 g in dextrose 5 % 50 mL IVPB     2 g 100 mL/hr over 30 Minutes Intravenous On call to O.R. 03/09/13 1200 03/09/13  1426       Assessment/Plan Cholelithiasis POD #1 s/p lap chole with neg IOC Abdominal pain Nausea H/o crohns s/p ileocecectomy 02/2012 EGD 03/04/13 Dr. Rhea Belton Minimal esophagitis from vomiting  Colonoscopy 03/07/13 - Dr. Leone Payor - biopsy showed chronic active enteritis No SBO 03/05/13 film  Gallstones, normal CBD, negative HIDA  Normal LFT'S, LIPASE AND WBC    Plan: 1.  Pending renal US per primary this morning 2.  Start clears, advance to low fat at lunch 3.  Ambulate and IS 4.  SCD's and not on med DVT proph at this time ok to start per primary service if desired 5.  D/C today if tolerating diet with pain meds 6.  Discussed post op instructions/restrictions 7.  F/u appt 03/29/13, 2:15pm, to arrive 30 min prior to appt time for check in    LOS: 9 days    DORT, Maleeha Halls 03/10/2013, 7:46 AM Pager: [phone]

## 2013-03-10 NOTE — Progress Notes (Signed)
    Progress Note   Subjective  Overall feels better, just sore from surgery.    Objective   Vital signs in last 24 hours: Temp:  [97.9 F (36.6 C)-98.6 F (37 C)] 98.6 F (37 C) (11/13 0532) Pulse Rate:  [71-105] 96 (11/13 0532) Resp:  [17-19] 18 (11/13 0532) BP: (92-126)/(50-77) 92/50 mmHg (11/13 0532) SpO2:  [97 %-100 %] 97 % (11/13 0532) Last BM Date: 03/08/13 General:    white female in NAD Heart:  Regular rate and rhythm; no murmurs Lungs: Respirations even and unlabored, lungs CTA bilaterally Abdomen:  Soft, nontender and nondistended. Normal bowel sounds. Extremities:  Without edema. Neurologic:  Alert and oriented,  grossly normal neurologically. Psych:  Cooperative. Normal mood and affect.    Assessment / Plan:    Crohn's disease. She is s/p lap chole yesterday. Gallbladder appeared normal. Because of periumbilical pain and LLQ pain surgery looked in these areas. A few periumbilical and lower midline adhesions were seen and removed.  Probably too soon to know but she does report significant improvement in nausea and abdominal pain. Will discharge her home when surgery feels she is stable enough to go. She has an appointment already scheduled with [name] on 11/18. If nausea better will restart 6mp. .    LOS: 9 days   Willette Cluster  03/10/2013, 9:12 AM

## 2013-03-11 DIAGNOSIS — K5000 Crohn's disease of small intestine without complications: Secondary | ICD-10-CM

## 2013-03-11 DIAGNOSIS — K8020 Calculus of gallbladder without cholecystitis without obstruction: Secondary | ICD-10-CM

## 2013-03-11 DIAGNOSIS — E440 Moderate protein-calorie malnutrition: Secondary | ICD-10-CM

## 2013-03-11 LAB — MRSA CULTURE

## 2013-03-11 MED ORDER — PROMETHAZINE HCL 25 MG/ML IJ SOLN: 25.0000 mg | INTRAMUSCULAR | Status: DC | PRN

## 2013-03-11 MED ORDER — OXYCODONE HCL 5 MG PO TABS: 5.0000 mg | ORAL_TABLET | Freq: Four times a day (QID) | ORAL | Status: DC | PRN

## 2013-03-11 MED ORDER — PROMETHAZINE HCL 12.5 MG PO TABS
12.5000 mg | ORAL_TABLET | Freq: Four times a day (QID) | ORAL | Status: DC | PRN
Start: 2013-03-11 — End: 2013-03-29

## 2013-03-11 MED ORDER — ACETAMINOPHEN 325 MG PO TABS
650.0000 mg | ORAL_TABLET | Freq: Four times a day (QID) | ORAL | Status: DC
  Administered 2013-03-11 (×2): 650 mg via ORAL
  Filled 2013-03-11 (×2): qty 2

## 2013-03-11 NOTE — Progress Notes (Signed)
Patient given all discharge instructions, and voiced an understanding of all instructions.  Patient agreeable to discharge plan in place, and is being discharged in stable medical condition.  Patient to be transported home with family assisting.  Philomena Doheny RN

## 2013-03-11 NOTE — Progress Notes (Signed)
2 Days Post-Op  Subjective: Was kept overnight because of pain and inability to tolerate solid food.  Doing better now.  Notes still needs some IV pain meds.  Ambulating well.  Using IS.  Tolerated low fat breakfast this am.  Wants to go home.  Dad to come and pick her up.  Objective: Vital signs in last 24 hours: Temp:  [97.5 F (36.4 C)-98.5 F (36.9 C)] 98.5 F (36.9 C) (11/14 0500) Pulse Rate:  [83-93] 93 (11/14 0500) Resp:  [18] 18 (11/14 0500) BP: (92-103)/(53-67) 92/53 mmHg (11/14 0500) SpO2:  [98 %-100 %] 98 % (11/14 0500) Last BM Date: 03/08/13  Intake/Output from previous day: 11/13 0701 - 11/14 0700 In: 2355 [P.O.:480; I.V.:1875] Out: 300 [Urine:300] Intake/Output this shift:    PE: Gen:  Alert, NAD, pleasant Abd: Soft, appropriately tender, ND, +BS, no HSM, incisions C/D/I   Lab Results:  No results found for this basename: WBC, HGB, HCT, PLT,  in the last 72 hours BMET No results found for this basename: NA, K, CL, CO2, GLUCOSE, BUN, CREATININE, CALCIUM,  in the last 72 hours PT/INR No results found for this basename: LABPROT, INR,  in the last 72 hours CMP     Component Value Date/Time   NA 138 03/03/2013 0334   K 3.7 03/03/2013 0334   CL 109 03/03/2013 0334   CO2 18* 03/03/2013 0334   GLUCOSE 102* 03/03/2013 0334   BUN 7 03/03/2013 0334   CREATININE 0.60 03/03/2013 0334   CALCIUM 8.5 03/03/2013 0334   PROT 6.8 03/04/2013 0534   ALBUMIN 3.0* 03/04/2013 0534   AST 10 03/04/2013 0534   ALT 5 03/04/2013 0534   ALKPHOS 45 03/04/2013 0534   BILITOT 0.3 03/04/2013 0534   GFRNONAA >90 03/03/2013 0334   GFRAA >90 03/03/2013 0334   Lipase     Component Value Date/Time   LIPASE 33 03/04/2013 0534       Studies/Results: US Renal  03/10/2013   CLINICAL DATA:  Evaluate for hydronephrosis.  EXAM: RENAL/URINARY TRACT ULTRASOUND COMPLETE  COMPARISON:  Abdominal ultrasound 03/03/2013; CT 03/02/2013  FINDINGS: Right Kidney  Length: Measures 11.2 cm. Echogenicity within  normal limits. No mass or hydronephrosis visualized.  Left Kidney  Length: Measures 10.8 cm. Echogenicity within normal limits. No mass or hydronephrosis visualized.  Bladder  Minimal layering debris within the bladder lumen.  IMPRESSION: 1. No hydronephrosis 2. Minimal layering debris within the bladder lumen. Recommend correlation with urinalysis to exclude urinary tract infection.   Electronically Signed   By: Annia Belt M.D.   On: 03/10/2013 11:28    Anti-infectives: Anti-infectives   Start     Dose/Rate Route Frequency Ordered Stop   03/10/13 0600  cefOXitin (MEFOXIN) 2 g in dextrose 5 % 50 mL IVPB     2 g 100 mL/hr over 30 Minutes Intravenous On call to O.R. 03/09/13 1200 03/09/13 1426       Assessment/Plan Cholelithiasis POD #2 s/p lap chole with neg IOC  Abdominal pain  Nausea  H/o crohns s/p ileocecectomy 02/2012  EGD 03/04/13 Dr. Rhea Belton Minimal esophagitis from vomiting  Colonoscopy 03/07/13 - Dr. Leone Payor - biopsy showed chronic active enteritis  No SBO 03/05/13 film  Gallstones, normal CBD, negative HIDA  Normal LFT'S, LIPASE AND WBC   Plan:  1. Tolerating low fat diet 2. Ambulating well and using IS  3. SCD's for DVT prophylaxis 4. D/C IV pain meds, add oral tylenol, continue oxy IR 5. D/C today  6. Discussed post  op instructions/restrictions  7. F/u appt 03/29/13, 2:15pm, to arrive 30 min prior to appt time for check in     LOS: 10 days    DORT, Jshaun Abernathy 03/11/2013, 9:33 AM Pager: [phone]

## 2013-03-11 NOTE — Progress Notes (Signed)
Patient seen earlier today also. Ready for dc.

## 2013-03-11 NOTE — Progress Notes (Signed)
Pt walking in halls when i visited.  Looks good.   Plan per PA note  Mary Sella. Andrey Campanile, MD, FACS General, Bariatric, & Minimally Invasive Surgery [facility] Surgery, [place]

## 2013-03-11 NOTE — Discharge Summary (Signed)
[facility] Discharge Summary  Name: Robin Miller MRN: [id number] DOB: 08/05/1990 22 y.o. PCP:  [name], MD  Date of Admission: 03/01/2013  4:26 PM Date of Discharge: 03/11/2013 Primary Gastroenterologist: [name], MD Discharging Physician: [name], MD  Discharge Diagnosis: 1. Crohn's disease, s/p ileocecectomy 2013. Maintained on 6mp and Humira 2. Cholelithiasis / chronic cholecystitis, s/p lap cholecystectomy this admission 3. Abdominal adhesions, s/p LOA this admission 4. History of ureteral stones 5. Mild esophagitis  Consultations: 1. General Surgery  2. Urology  Procedures Performed:  Nm Hepatobiliary  03/04/2013   CLINICAL DATA:  Abdominal pain, nausea, vomiting, history of gallstones and Crohn's disease  EXAM: NUCLEAR MEDICINE HEPATOBILIARY IMAGING  TECHNIQUE: Sequential images of the abdomen were obtained out to 60 minutes following intravenous administration of radiopharmaceutical.  COMPARISON:  03/03/2013  RADIOPHARMACEUTICALS:  5.73mCi Tc-58m Choletec  FINDINGS: Normal hepatic uptake and excretion of the radiotracer into the biliary tract. Gallbladder is visualized beginning at 5 min and continues to accumulate radiotracer. Activity progresses through the biliary tract into the small bowel. Therefore, the cystic duct and common bile duct are both patent. Negative for cholecystitis.  IMPRESSION: Patent cystic duct and common bile duct.  Normal hepatobiliary scan.   Electronically Signed   By: Ruel Favors M.D.   On: 03/04/2013 12:38   US Abdomen Complete  03/03/2013   CLINICAL DATA:  22-year- female with distended gallbladder on CT. Abdominal pain. History of Crohn disease. Initial encounter.  EXAM: ULTRASOUND ABDOMEN COMPLETE  COMPARISON:  CT Abdomen and Pelvis 03/02/2013 and earlier.  FINDINGS: Gallbladder  Layering gallstones within the gallbladder which is mildly distended. Gallbladder wall thickness remains normal at 1 to 2 mm. Multiple small  stones, individually approximately 5 mm diameter. No sonographic Murphy sign elicited.  Common bile duct  Diameter: 4 mm, normal.  Liver  No focal lesion identified. Within normal limits in parenchymal echogenicity.  IVC  No abnormality visualized.  Pancreas  Visualized portion unremarkable.  Spleen  Size and appearance within normal limits.  Right Kidney  Length: 10.8 cm. Echogenicity within normal limits. No mass or hydronephrosis visualized.  Left Kidney  Length: 11.1 cm. Echogenicity within normal limits. No mass or hydronephrosis visualized.  Abdominal aorta  No aneurysm visualized.  IMPRESSION: Cholelithiasis, without evidence of acute cholecystitis.   Electronically Signed   By: Augusto Gamble M.D.   On: 03/03/2013 20:45   Ct Abdomen Pelvis W Contrast  03/02/2013   CLINICAL DATA:  Patient with abdominal pain. History of Crohn's disease as well as right-sided renal stone.  EXAM: CT ABDOMEN AND PELVIS WITH CONTRAST  TECHNIQUE: Multidetector CT imaging of the abdomen and pelvis was performed using the standard protocol following bolus administration of intravenous contrast.  CONTRAST:  OMNIPAQUE IOHEXOL 300 MG/ML  SOLN  COMPARISON:  CT 08/11/2012.  FINDINGS: Limited visualization of the lung bases demonstrates minimal dependent atelectasis within the left lower lobe. No pleural effusion. Normal heart size.  Liver is normal in size and contour. Focal fatty deposition adjacent to the falciform ligament. Portal vein is patent. Gallbladder is mildly distended. No intrahepatic or extrahepatic biliary ductal dilatation. The spleen and pancreas are unremarkable. Normal bilateral adrenal glands. Kidneys enhance symmetrically with contrast. No hydronephrosis.  Normal caliber abdominal aorta. No retroperitoneal lymphadenopathy. Urinary bladder is unremarkable. Uterus and bilateral adnexa are unremarkable.  Postsurgical changes within the right lower quadrant. No abnormal bowel wall thickening. No evidence for bowel  obstruction. No free fluid or free intraperitoneal air.  No  aggressive appearing osseous lesions.  IMPRESSION: 1. No acute abnormality or explanation for right lower quadrant pain. 2. Surgical changes within the region of the terminal ileum without evidence for active Crohn's disease. 3. Mildly distended gallbladder without surrounding inflammatory change.   Electronically Signed   By: Annia Belt M.D.   On: 03/02/2013 14:43   US Renal  03/10/2013   CLINICAL DATA:  Evaluate for hydronephrosis.  EXAM: RENAL/URINARY TRACT ULTRASOUND COMPLETE  COMPARISON:  Abdominal ultrasound 03/03/2013; CT 03/02/2013  FINDINGS: Right Kidney  Length: Measures 11.2 cm. Echogenicity within normal limits. No mass or hydronephrosis visualized.  Left Kidney  Length: Measures 10.8 cm. Echogenicity within normal limits. No mass or hydronephrosis visualized.  Bladder  Minimal layering debris within the bladder lumen.  IMPRESSION: 1. No hydronephrosis 2. Minimal layering debris within the bladder lumen. Recommend correlation with urinalysis to exclude urinary tract infection.   Electronically Signed   By: Annia Belt M.D.   On: 03/10/2013 11:28   Dg Abd Acute W/chest  03/05/2013   CLINICAL DATA:  Crohn's disease, abdominal pain  EXAM: ACUTE ABDOMEN SERIES (ABDOMEN 2 VIEW & CHEST 1 VIEW)  COMPARISON:  CT 03/02/2013  FINDINGS: Normal mediastinum and cardiac silhouette. Normal pulmonary vasculature. No evidence of effusion, infiltrate, or pneumothorax. No acute bony abnormality.  Oral contrast from recent CT scan has progressed into the colon to the level of the descending colon. There are no dilated loops of large or small bowel. No intraperitoneal free air. No pathologic calcifications. Mild levoscoliosis of the lumbar spine.  IMPRESSION: 1. Clear chest. 2. No evidence of bowel obstruction or intraperitoneal free air. 3. Findings conveyed toSteve Grosson 03/05/2013 at10:03.   Electronically Signed   By: Genevive Bi M.D.   On:  03/05/2013 10:04   Acute Abdominal Series  03/01/2013   CLINICAL DATA:  Diffuse abdominal pain. Nausea. Vomiting. History of Crohn's disease. History of renal calculus on the right.  EXAM: ACUTE ABDOMEN SERIES (ABDOMEN 2 VIEW & CHEST 1 VIEW)  COMPARISON:  CT 08/11/2012.  Chest 07/30/2007.  FINDINGS: Cardiac silhouette is normal size shape. No mediastinal or hilar lesions are seen. Mediastinal and hilar contours appear stable. Lungs are free of infiltrates. No masses are seen. No pleural disease is evident. There is thoracic scoliosis convexity to the right.  No pneumoperitoneum is evident. Bowel gas is nonspecific and within limits of normal. No opaque calculi are seen. There is lumbar scoliosis convexity to the left. There is moderate fecal distention of colon in the pelvis.  IMPRESSION: No acute or active cardiopulmonary or pleural abnormalities are seen. Thoracolumbar scoliosis is present.  No pneumoperitoneum is evident. Bowel gas is nonspecific and within limits of normal. No opaque calculi are seen. There is moderate fecal distention of colon in the pelvis.   Electronically Signed   By: Onalee Hua  Call M.D.   On: 03/01/2013 17:14    GI Procedures:  EGD (Dr.Pyrtle) - mild esophagitis Colonoscopy (Dr. Leone Payor) - normal. Laparoscopic cholecystectomy and LOA  History/Physical Exam:  See Admission H&P  Admission HPI:  LEE-ANN GAL is a 22 y.o. female followed by 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 She was intolerant to steroids and Entocort wasn't effective. Alternative meds (immunomodulators and biologics) discussed but patient ultimately decided on segmental resection. In November she underwent an ileocecectomy by Dr Michaell Cowing. Specimen pathology c/w ulceration /stricture / fistula . In early January we started her on 6mp. A few weeks later we admitted her to the hospital for  acute abdominal pain which turned out to be a ureteral stone. In March patient was started on Entocort for mild  crohn's flare. CTscan obtained and showed possible transverse colon thickening vrs underdistention. Colonoscopy was recommended, patient wanted to hold off. She was seen again in May with pain and diarrhea. She declined colonoscopy. Symptoms improved by time of follow up in July, Again we recommended colonoscopy, she declined. We recommended starting biologics which she started in early August. Since being on Humira diarrhea and abdominal pain have been getting better. At 3am patient woke up with nausea, vomiting, abdominal pain. Later this morning she developed diarrhea. She has had several episodes of vomiting today. Abdominal pain is constant, dull with frequent sharp pains. Pain similar to when she had kidney stones minus flank pain. No chills. No sick contacts. No recent antibiotics   Hospital Course by problem list: 1. Crohn's disease. Patient was admitted from our office to a medical bed at [facility] on 03/01/13. She continued to have abdominal pain and nausea overnight.  A CTscan with contrast was normal except for mildly distended gallbladder. There was no evidence of active crohn's. No recurrent ureteral stones. She underwent EGD which was normal except for mild esophagitis. Abdominal ultrasound revealed gallstones, mildly distended gallbladder. HIDA negative. Patient's pain seemed to migrate as it was periumbilical, left lower quadrant, then upper and surgery described right sided abdominal pain in area of ileocolonic anastomosis. No convincing evidence of gallbladder disease, patient agreed to colonoscopy to evaluate for active crohn's. Though prep was only "fair", colonoscopy was normal without any evidence for active crohn's. Urology was consulted, her symptoms were not felt to be urologic in nature. Renal ultrasound negative for hydronephrosis, urinalysis was negative. Patient ultimately underwent laparoscopic cholecystectomy. Gallbladder looked normal. Some thin omental adhesions around  umbilicus and mid abdomen were removed. Gallbladder pathology c/w chronic cholecystitis. Patient's abdominal pain and nausea / vomiting improved. She was discharged home with instructions to resume Humira. Her 6mp was placed on hold this admission to see if it could be contributing to nausea. We would likely restart 6mp at time of her follow up appointment. .   2. Cholelithiasis / chronic cholecystitis, s/p lap cholecystectomy  3. Abdominal adhesions, s/p LOA  4. History of ureteral stones. No recurrent stones found this admission.   5. Mild esophagitis  Discharge Vitals:  BP 92/53  Pulse 93  Temp(Src) 98.5 F (36.9 C) (Oral)  Resp 18  Ht 5\' 3"  (1.6 m)  Wt 119 lb (53.978 kg)  BMI 21.09 kg/m2  SpO2 98%  LMP 01/29/2013  Physical Exam General: Plesant female in NAD Cardiac: Regular rate and rhythm Pulmonary: Lungs CTA bilaterally Abdominal: Soft, nondistended. Moderate periumbilical tenderness. A few bowel sounds. Extremiies: No edema Neuro: Alert and oriented Psych: Coorperative  Discharge Labs:  Results for orders placed during the hospital encounter of 03/01/13 (from the past 24 hour(s))  URINALYSIS, ROUTINE W REFLEX MICROSCOPIC     Status: None   Collection Time    03/10/13  1:29 PM      Result Value Range   Color, Urine YELLOW  YELLOW   APPearance CLEAR  CLEAR   Specific Gravity, Urine 1.012  1.005 - 1.030   pH 6.5  5.0 - 8.0   Glucose, UA NEGATIVE  NEGATIVE mg/dL   Hgb urine dipstick NEGATIVE  NEGATIVE   Bilirubin Urine NEGATIVE  NEGATIVE   Ketones, ur NEGATIVE  NEGATIVE mg/dL   Protein, ur NEGATIVE  NEGATIVE mg/dL   Urobilinogen,  UA 0.2  0.0 - 1.0 mg/dL   Nitrite NEGATIVE  NEGATIVE   Leukocytes, UA NEGATIVE  NEGATIVE    Disposition and follow-up:   Ms.Damali F Teola Bradley was discharged from [facility] in stable condition.    Follow-up Appointments:  Future Appointments Provider Department Dept Phone   03/15/2013 3:15 PM [name], MD [facility] [phone]   03/29/2013 2:15 PM [facility] Doc Of The Week [facility] Surgery, [place] [phone]   05/09/2013 9:00 AM [facility] Nurse [facility] [phone]      Discharge Medications:   Medication List    STOP taking these medications       mercaptopurine 50 MG tablet  Commonly known as:  PURINETHOL     traMADol 50 MG tablet  Commonly known as:  ULTRAM      TAKE these medications       adalimumab 40 MG/0.8ML injection  Commonly known as:  HUMIRA PEN  Inject 40 mg (1 pen) subcutaneous every 14 days following the completion of the starter kit.     etonogestrel-ethinyl estradiol 0.12-0.015 MG/24HR vaginal ring  Commonly known as:  NUVARING  Place 1 each vaginally every 28 (twenty-eight) days. Insert vaginally and leave in place for 3 consecutive weeks, then remove for 1 week.     hyoscyamine 0.125 MG SL tablet  Commonly known as:  LEVSIN SL  Take 1-2 tablets by mouth every 4 hours before meals as needed     multivitamin with minerals Tabs tablet  Take 1 tablet by mouth daily.     oxyCODONE 5 MG immediate release tablet  Commonly known as:  Oxy IR/ROXICODONE  Take 1 tablet (5 mg total) by mouth every 6 (six) hours as needed for severe pain.     promethazine 12.5 MG tablet  Commonly known as:  PHENERGAN  Take 1 tablet (12.5 mg total) by mouth every 6 (six) hours as needed for nausea or vomiting.        Signed: Willette Cluster 03/11/2013, 8:23 AM

## 2013-03-15 ENCOUNTER — Ambulatory Visit: Payer: BC Managed Care – PPO | Admitting: Gastroenterology

## 2013-03-21 ENCOUNTER — Telehealth: Payer: Self-pay | Admitting: Gastroenterology

## 2013-03-21 NOTE — Telephone Encounter (Signed)
Questions answered about patients humira

## 2013-03-29 ENCOUNTER — Other Ambulatory Visit: Payer: Self-pay | Admitting: *Deleted

## 2013-03-29 ENCOUNTER — Encounter (INDEPENDENT_AMBULATORY_CARE_PROVIDER_SITE_OTHER): Payer: BC Managed Care – PPO

## 2013-03-29 ENCOUNTER — Ambulatory Visit (INDEPENDENT_AMBULATORY_CARE_PROVIDER_SITE_OTHER): Payer: BC Managed Care – PPO | Admitting: Internal Medicine

## 2013-03-29 ENCOUNTER — Encounter: Payer: Self-pay | Admitting: Internal Medicine

## 2013-03-29 VITALS — BP 112/72 | HR 96 | Temp 99.5°F | Ht 63.0 in | Wt 124.0 lb

## 2013-03-29 DIAGNOSIS — R05 Cough: Secondary | ICD-10-CM

## 2013-03-29 DIAGNOSIS — R509 Fever, unspecified: Secondary | ICD-10-CM

## 2013-03-29 DIAGNOSIS — R059 Cough, unspecified: Secondary | ICD-10-CM

## 2013-03-29 LAB — CBC WITH DIFFERENTIAL/PLATELET
Basophils Absolute: 0 10*3/uL (ref 0.0–0.1)
Basophils Relative: 1 % (ref 0–1)
Eosinophils Relative: 2 % (ref 0–5)
HCT: 38.4 % (ref 36.0–46.0)
Hemoglobin: 13.3 g/dL (ref 12.0–15.0)
MCH: 30.2 pg (ref 26.0–34.0)
MCHC: 34.6 g/dL (ref 30.0–36.0)
MCV: 87.1 fL (ref 78.0–100.0)
Monocytes Absolute: 0.7 10*3/uL (ref 0.1–1.0)
Monocytes Relative: 10 % (ref 3–12)
Neutro Abs: 4.1 10*3/uL (ref 1.7–7.7)
Platelets: 339 10*3/uL (ref 150–400)

## 2013-03-29 MED ORDER — BENZONATATE 100 MG PO CAPS: ORAL_CAPSULE | ORAL | Status: DC

## 2013-03-29 MED ORDER — ALBUTEROL SULFATE HFA 108 (90 BASE) MCG/ACT IN AERS: 2.0000 | INHALATION_SPRAY | RESPIRATORY_TRACT | Status: DC | PRN

## 2013-03-29 MED ORDER — LEVOFLOXACIN 500 MG PO TABS: 500.0000 mg | ORAL_TABLET | Freq: Every day | ORAL | Status: DC

## 2013-03-30 ENCOUNTER — Ambulatory Visit
Admission: RE | Admit: 2013-03-30 | Discharge: 2013-03-30 | Disposition: A | Discharge status: Home / Self Care | Payer: BC Managed Care – PPO | Source: Ambulatory Visit | Attending: Internal Medicine | Admitting: Internal Medicine

## 2013-03-30 ENCOUNTER — Telehealth: Payer: Self-pay | Admitting: Internal Medicine

## 2013-03-30 DIAGNOSIS — R059 Cough, unspecified: Secondary | ICD-10-CM

## 2013-03-30 DIAGNOSIS — R509 Fever, unspecified: Secondary | ICD-10-CM

## 2013-03-30 DIAGNOSIS — R05 Cough: Secondary | ICD-10-CM

## 2013-03-30 NOTE — Telephone Encounter (Signed)
Patient verbalizes understanding of these instructions.

## 2013-03-31 ENCOUNTER — Ambulatory Visit: Payer: BC Managed Care – PPO | Admitting: Internal Medicine

## 2013-03-31 ENCOUNTER — Encounter: Payer: Self-pay | Admitting: Physician Assistant

## 2013-03-31 ENCOUNTER — Ambulatory Visit (INDEPENDENT_AMBULATORY_CARE_PROVIDER_SITE_OTHER): Payer: BC Managed Care – PPO | Admitting: Physician Assistant

## 2013-03-31 VITALS — BP 100/60 | HR 92 | Ht 63.0 in | Wt 123.0 lb

## 2013-03-31 DIAGNOSIS — K5080 Crohn's disease of both small and large intestine without complications: Secondary | ICD-10-CM

## 2013-03-31 DIAGNOSIS — R109 Unspecified abdominal pain: Secondary | ICD-10-CM

## 2013-03-31 DIAGNOSIS — K50818 Crohn's disease of both small and large intestine with other complication: Secondary | ICD-10-CM

## 2013-03-31 NOTE — Progress Notes (Signed)
Reviewed and agree with management plan.  Ezriel Boffa T. Alahni Varone, MD FACG 

## 2013-03-31 NOTE — Patient Instructions (Signed)
We have given you samples of Florastor to take. You should take 1 capsule twice daily x at least 2 weeks (while on antibiotics).  You have been scheduled for a follow up appointment with Dr Russella Dar on Monday, 05/16/13 @ 9:15 am. If this appointment time/date does not work well for you, you may reschedule by calling [phone].

## 2013-03-31 NOTE — Progress Notes (Signed)
Subjective:    Patient ID: Robin Miller, female    DOB: April 03, 1991, 22 y.o.   MRN: [id number]  HPI  Robin Miller is a pleasant 22 year old white female known to [name] who has history of Crohn's ileocolitis and is status post ileocecectomy in November of 2013 for stricture and fistulous disease.. She has been back on Humira and remains on mercaptopurine. She had a recent rather prolonged hospitalization with acute abdominal pain and persistent nausea and vomiting. She had a rather extensive workup including CT scan of the abdomen and pelvis, abdominal ultrasound, HIDA scan, upper endoscopy, and eventually underwent laparoscopic cholecystectomy on 03/09/2013. She did have findings of chronic cholecystitis but no stones. She comes back in today for a post hospital followup and unfortunately has been sick again with bronchitis. She's currently on Levaquin and says she's really not feeling any better after 5 days of Levaquin.  She states that she feels a lot better from a GI standpoint. She is really not having any abdominal pain. She says she is eating much better and has had a few very transient episodes of nausea but no vomiting. She occasionally has some diarrhea but for the most part has been having normal bowel movements.    Review of Systems  Constitutional: Negative.   HENT: Negative.   Eyes: Negative.   Respiratory: Positive for cough and chest tightness.   Cardiovascular: Negative.   Gastrointestinal: Positive for abdominal pain.  Endocrine: Negative.   Genitourinary: Negative.   Musculoskeletal: Negative.   Allergic/Immunologic: Negative.   Neurological: Negative.   Hematological: Negative.   Psychiatric/Behavioral: Negative.    Outpatient Prescriptions Prior to Visit  Medication Sig Dispense Refill  . adalimumab (HUMIRA PEN) 40 MG/0.8ML injection Inject 40 mg (1 pen) subcutaneous every 14 days following the completion of the starter kit.  2 each  3  . albuterol (PROVENTIL  HFA;VENTOLIN HFA) 108 (90 BASE) MCG/ACT inhaler Inhale 2 puffs into the lungs every 4 (four) hours as needed for wheezing or shortness of breath.  1 Inhaler  0  . benzonatate (TESSALON) 100 MG capsule 2 pearls tid prn cough  60 capsule  0  . etonogestrel-ethinyl estradiol (NUVARING) 0.12-0.015 MG/24HR vaginal ring Place 1 each vaginally every 28 (twenty-eight) days. Insert vaginally and leave in place for 3 consecutive weeks, then remove for 1 week.       . hyoscyamine (LEVSIN SL) 0.125 MG SL tablet Take 1-2 tablets by mouth every 4 hours before meals as needed  100 tablet  11  . levofloxacin (LEVAQUIN) 500 MG tablet Take 1 tablet (500 mg total) by mouth daily.  7 tablet  0  . mercaptopurine (PURINETHOL) 50 MG tablet Take 50 mg by mouth daily. Give on an empty stomach 1 hour before or 2 hours after meals. Caution: Chemotherapy.      . Multiple Vitamin (MULTIVITAMIN WITH MINERALS) TABS tablet Take 1 tablet by mouth daily.       No facility-administered medications prior to visit.   Allergies  Allergen Reactions  . Prednisone     Blurred vision, ringing in ears, insomnia, shaky, body convulsions,nausea     Patient Active Problem List   Diagnosis Date Noted  . Symptomatic cholelithiasis 03/11/2013  . Moderate malnutrition 03/08/2013  . Abdominal pain, chronic, right lower quadrant 03/05/2013  . Nausea with vomiting 03/05/2013  . 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 Renal stone 05/27/2012  . Renal colic on right side 05/27/2012  . 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 Medication side effect 11/04/2011  . History of anemia 04/15/2011      History  Substance Use Topics  . Smoking status: Never Smoker   . Smokeless tobacco: Never Used  . Alcohol Use: No   family history includes Breast cancer in her mother and paternal aunt; Colitis in her maternal aunt; Colon cancer in her paternal  grandmother; Crohn's disease in her father; Stroke in her mother.  Objective:   Physical Exam and and and and and and no acute distress, pleasant blood pressure 100/60 pulse 92 height 5 foot 3 weight 123. HEENT; nontraumatic normocephalic EOMI PERRLA sclera anicteric, Supple; no JVD, Cardiovascular; regular rate and rhythm with S1-S2 no murmur or gallop, Pulmonary; clear bilaterally, Abdomen; soft she has healing incisional ports from recent cholecystectomy no focal tenderness no guarding or rebound no palpable mass or hepatosplenomegaly, Extremities; no clubbing cyanosis or edema skin warm and dry, Psych; mood and affect appropriate        Assessment & Plan:  #38  22 year old white female with Crohn's ileocolitis(stricturing and fistulizing) disease status post ileocecectomy November 2013 stable and currently doing well on Humira and 6-MP. Recent hospitalization for acute abdominal pain nausea and vomiting with no evidence for active Crohn's seen on imaging. #2 status post recent laparoscopic cholecystectomy with finding of chronic cholecystitis no stones #3 history of ureterolithiasis  Plan; patient is asked to delay her Humira injection tomorrow as she is acutely ill with bronchitis and has been having some fevers. She will delay for one week and if much improved at that point can resume her usual regimen Continue mercaptopurine 50 mg by mouth daily Add Florastor  twice daily over the next couple of weeks while taking antibiotics Followup with [name] in 6 weeks or sooner as needed She is up to date with immunizations.

## 2013-04-01 ENCOUNTER — Ambulatory Visit (INDEPENDENT_AMBULATORY_CARE_PROVIDER_SITE_OTHER): Payer: BC Managed Care – PPO | Admitting: Internal Medicine

## 2013-04-01 ENCOUNTER — Encounter: Payer: Self-pay | Admitting: Internal Medicine

## 2013-04-01 VITALS — BP 126/86 | HR 92 | Temp 99.5°F | Ht 63.0 in | Wt 124.0 lb

## 2013-04-01 DIAGNOSIS — J209 Acute bronchitis, unspecified: Secondary | ICD-10-CM

## 2013-04-01 MED ORDER — ALBUTEROL SULFATE HFA 108 (90 BASE) MCG/ACT IN AERS: 2.0000 | INHALATION_SPRAY | Freq: Four times a day (QID) | RESPIRATORY_TRACT | Status: DC | PRN

## 2013-04-01 MED ORDER — CEFTRIAXONE SODIUM 1 G IJ SOLR
1.0000 g | Freq: Once | INTRAMUSCULAR | Status: AC
  Administered 2013-04-01: 1 g via INTRAMUSCULAR

## 2013-04-01 MED ORDER — LEVOFLOXACIN 500 MG PO TABS: 500.0000 mg | ORAL_TABLET | Freq: Every day | ORAL | Status: DC

## 2013-04-02 ENCOUNTER — Other Ambulatory Visit: Payer: Self-pay | Admitting: Gastroenterology

## 2013-04-04 NOTE — Patient Instructions (Signed)
Rocephin 1 g IM given today. Continue antibiotics. Call next week for progress report.

## 2013-04-04 NOTE — Telephone Encounter (Signed)
Dr Russella Dar, this patient requests refills on Tramadol. She was given #120 with 1 refill by you on 08/09/12 to take PRN. She was recently seen by Amy on 03/31/13. Do you want me to okay refills of Tramadol?

## 2013-04-07 ENCOUNTER — Encounter (INDEPENDENT_AMBULATORY_CARE_PROVIDER_SITE_OTHER): Payer: Self-pay | Admitting: General Surgery

## 2013-04-07 ENCOUNTER — Encounter: Payer: Self-pay | Admitting: Internal Medicine

## 2013-04-07 ENCOUNTER — Ambulatory Visit (INDEPENDENT_AMBULATORY_CARE_PROVIDER_SITE_OTHER): Payer: BC Managed Care – PPO | Admitting: General Surgery

## 2013-04-07 ENCOUNTER — Ambulatory Visit (INDEPENDENT_AMBULATORY_CARE_PROVIDER_SITE_OTHER): Payer: BC Managed Care – PPO | Admitting: Internal Medicine

## 2013-04-07 VITALS — BP 106/74 | HR 96 | Temp 98.7°F | Ht 63.0 in | Wt 124.0 lb

## 2013-04-07 VITALS — BP 108/68 | HR 80 | Temp 98.6°F | Resp 18 | Ht 63.0 in | Wt 125.4 lb

## 2013-04-07 DIAGNOSIS — J209 Acute bronchitis, unspecified: Secondary | ICD-10-CM

## 2013-04-07 DIAGNOSIS — Z09 Encounter for follow-up examination after completed treatment for conditions other than malignant neoplasm: Secondary | ICD-10-CM

## 2013-04-07 MED ORDER — LEVOFLOXACIN 500 MG PO TABS: 500.0000 mg | ORAL_TABLET | Freq: Every day | ORAL | Status: DC

## 2013-04-07 NOTE — Patient Instructions (Signed)
Take Levaquin for an additional 7 days. Use Symbicort inhaler sample 2 sprays every 12 hours. Continue albuterol inhaler. Call if not better in 10 days.

## 2013-04-07 NOTE — Patient Instructions (Signed)
Can resume full activities Please call with any questions

## 2013-04-07 NOTE — Progress Notes (Signed)
Subjective:     Patient ID: Robin Miller, female   DOB: 1991/01/21, 22 y.o.   MRN: [id number]  HPI 22 year old Caucasian female comes in for followup after going laparoscopic cholecystectomy On November 12 while an inpatient. She was seen in consultation on the D[facility] service. There is no signs of acute cholecystitis. But she had worsening nausea as well as nonspecific abdominal pain. Patient has a history of Crohn's disease and a prior ileocecectomy by Dr. Michaell Cowing. She states that she is doing really well. She said the additional symptoms that force her to be hospitalized have resolved since surgery. She still has her typical Crohn's flares. However she states that her appetite is much improved. Her nausea is much less. She denies any fevers or chills. Denies any jaundice  Review of Systems     Objective:   Physical Exam BP 108/68  Pulse 80  Temp(Src) 98.6 F (37 C) (Temporal)  Resp 18  Ht 5\' 3"  (1.6 m)  Wt 125 lb 6.4 oz (56.881 kg)  BMI 22.22 kg/m2  LMP 03/07/2013  Gen: alert, NAD, non-toxic appearing Pupils: equal, no scleral icterus Pulm: Lungs clear to auscultation, symmetric chest rise CV: regular rate and rhythm Abd: soft, nontender, nondistended. Well-healed trocar sites. No cellulitis. No incisional hernia Ext: no edema,  Skin: no rash, no jaundice     Assessment:     Status post laparoscopic cholecystectomy     Plan:     We reviewed her operative path report and she was given a copy of it. It showed chronic cholecystitis, cholesterolosis, cholelithiasis. I think she is doing well. All of her questions were answered. Followup as needed with [place].  Mary Sella. Andrey Campanile, MD, FACS General, Bariatric, & Minimally Invasive Surgery [facility], [place]

## 2013-04-08 ENCOUNTER — Other Ambulatory Visit (INDEPENDENT_AMBULATORY_CARE_PROVIDER_SITE_OTHER): Payer: Self-pay | Admitting: Surgery

## 2013-04-11 NOTE — Telephone Encounter (Signed)
Ok to refill 

## 2013-04-11 NOTE — Telephone Encounter (Signed)
Please advise if okay to refill. 

## 2013-04-17 ENCOUNTER — Encounter: Payer: Self-pay | Admitting: Gastroenterology

## 2013-04-18 ENCOUNTER — Encounter: Payer: Self-pay | Admitting: Physician Assistant

## 2013-04-18 ENCOUNTER — Other Ambulatory Visit (INDEPENDENT_AMBULATORY_CARE_PROVIDER_SITE_OTHER): Payer: BC Managed Care – PPO

## 2013-04-18 ENCOUNTER — Ambulatory Visit (INDEPENDENT_AMBULATORY_CARE_PROVIDER_SITE_OTHER): Payer: BC Managed Care – PPO | Admitting: Physician Assistant

## 2013-04-18 VITALS — BP 100/80 | HR 80 | Ht 63.0 in | Wt 126.0 lb

## 2013-04-18 DIAGNOSIS — K5090 Crohn's disease, unspecified, without complications: Secondary | ICD-10-CM

## 2013-04-18 DIAGNOSIS — R197 Diarrhea, unspecified: Secondary | ICD-10-CM

## 2013-04-18 DIAGNOSIS — K5080 Crohn's disease of both small and large intestine without complications: Secondary | ICD-10-CM

## 2013-04-18 LAB — CBC WITH DIFFERENTIAL/PLATELET
Basophils Absolute: 0 10*3/uL (ref 0.0–0.1)
Basophils Relative: 0.6 % (ref 0.0–3.0)
Eosinophils Absolute: 0.1 10*3/uL (ref 0.0–0.7)
Eosinophils Relative: 1.6 % (ref 0.0–5.0)
HCT: 40.1 % (ref 36.0–46.0)
Hemoglobin: 13.5 g/dL (ref 12.0–15.0)
Lymphocytes Relative: 33.3 % (ref 12.0–46.0)
Lymphs Abs: 1.8 10*3/uL (ref 0.7–4.0)
MCHC: 33.7 g/dL (ref 30.0–36.0)
MCV: 89.9 fl (ref 78.0–100.0)
Monocytes Relative: 6.8 % (ref 3.0–12.0)
Neutro Abs: 3.1 10*3/uL (ref 1.4–7.7)
RBC: 4.46 Mil/uL (ref 3.87–5.11)

## 2013-04-18 MED ORDER — PROMETHAZINE HCL 25 MG RE SUPP: 25.0000 mg | Freq: Four times a day (QID) | RECTAL | Status: DC | PRN

## 2013-04-18 MED ORDER — METRONIDAZOLE 250 MG PO TABS: 250.0000 mg | ORAL_TABLET | Freq: Four times a day (QID) | ORAL | Status: DC

## 2013-04-18 MED ORDER — TRAMADOL HCL 50 MG PO TABS: ORAL_TABLET | ORAL | Status: DC

## 2013-04-18 MED ORDER — PROMETHAZINE HCL 12.5 MG PO TABS: ORAL_TABLET | ORAL | Status: DC

## 2013-04-18 MED ORDER — HYOSCYAMINE SULFATE 0.125 MG SL SUBL: 0.1250 mg | SUBLINGUAL_TABLET | SUBLINGUAL | Status: DC | PRN

## 2013-04-18 NOTE — Progress Notes (Signed)
Reviewed and agree with management plan.  Denard Tuminello T. Luane Rochon, MD FACG 

## 2013-04-18 NOTE — Progress Notes (Signed)
Subjective:    Patient ID: Robin Miller, female    DOB: 1990-04-30, 22 y.o.   MRN: [id number]  HPI  Robin Miller is a very nice 22 year old white female known to 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 She had a rather long hospitalization in November of 2014 with abdominal pain nausea and vomiting. Extensive workup was negative for any evidence of recurrent Crohn's including EGD and colonoscopy. She underwent a laparoscopic cholecystectomy for cholecystectomy on 03/09/2013 with findings of chronic cholecystitis but no stones. Her pain did improve postoperatively and she was seen back in followup a few weeks ago. At that time she was sick with bronchitis had taken a course of Levaquin and then was switched to another antibiotic after that by her PCP. She says she had been doing well from a GI standpoint until about 5 days ago when she developed nausea with eating lower abdominal cramping and urgency. She then started having diarrhea and says over the past 3 days all of her stools have been diarrheal. She has noticed a small amounts of blood intermittently. Saturday was a bad day with at least 8 or 9 bowel movements Sunday not quite as bad in today has had 4 or 5 bowel movements so far. She's not had any documented fever or chills. She's not been very much but can keep some by mouth is down she has been using Levsin sublingual and and occasional Ultram.    Review of Systems  Constitutional: Positive for appetite change and fatigue.  HENT: Negative.   Eyes: Negative.   Respiratory: Negative.   Gastrointestinal: Positive for nausea, abdominal pain and diarrhea.  Endocrine: Negative.   Genitourinary: Negative.   Musculoskeletal: Negative.   Allergic/Immunologic: Negative.   Neurological: Negative.   Hematological: Negative.   Psychiatric/Behavioral: Negative.    Outpatient Prescriptions Prior to Visit    Medication Sig Dispense Refill  . adalimumab (HUMIRA PEN) 40 MG/0.8ML injection Inject 40 mg (1 pen) subcutaneous every 14 days following the completion of the starter kit.  2 each  3  . etonogestrel-ethinyl estradiol (NUVARING) 0.12-0.015 MG/24HR vaginal ring Place 1 each vaginally every 28 (twenty-eight) days. Insert vaginally and leave in place for 3 consecutive weeks, then remove for 1 week.       . hyoscyamine (LEVSIN SL) 0.125 MG SL tablet Take 1-2 tablets by mouth every 4 hours before meals as needed  100 tablet  11  . mercaptopurine (PURINETHOL) 50 MG tablet Take 50 mg by mouth daily. Give on an empty stomach 1 hour before or 2 hours after meals. Caution: Chemotherapy.      . Multiple Vitamin (MULTIVITAMIN WITH MINERALS) TABS tablet Take 1 tablet by mouth daily.      . promethazine (PHENERGAN) 12.5 MG tablet Take one or two tablets by mouth every six hours as needed  for nausea  20 tablet  2  . traMADol (ULTRAM) 50 MG tablet TAKE ONE TABLET BY MOUTH FOUR TIMES DAILY   60 tablet  none  . albuterol (PROVENTIL HFA;VENTOLIN HFA) 108 (90 BASE) MCG/ACT inhaler Inhale 2 puffs into the lungs every 4 (four) hours as needed for wheezing or shortness of breath.  1 Inhaler  0  . albuterol (PROVENTIL HFA;VENTOLIN HFA) 108 (90 BASE) MCG/ACT inhaler Inhale 2 puffs into the lungs every 6 (six) hours as needed for wheezing or shortness of breath.  1 Inhaler  0  . benzonatate (  TESSALON) 100 MG capsule 2 pearls tid prn cough  60 capsule  0  . levofloxacin (LEVAQUIN) 500 MG tablet Take 1 tablet (500 mg total) by mouth daily.  7 tablet  0  . levofloxacin (LEVAQUIN) 500 MG tablet Take 1 tablet (500 mg total) by mouth daily.  7 tablet  0   No facility-administered medications prior to visit.   Allergies  Allergen Reactions  . Prednisone     Blurred vision, ringing in ears, insomnia, shaky, body convulsions,nausea   Patient Active Problem List   Diagnosis Date Noted  . Moderate malnutrition 03/08/2013  .  Abdominal pain, chronic, right lower quadrant 03/05/2013  . Nausea with vomiting 03/05/2013  . 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 Renal stone 05/27/2012  . Renal colic on right side 05/27/2012  . 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 Medication side effect 11/04/2011  . History of anemia 04/15/2011   History   Social History Narrative  . No narrative on file      family history includes Breast cancer in her mother and paternal aunt; Colitis in her maternal aunt; Colon cancer in her paternal grandmother; Crohn's disease in her father; Stroke in her mother.  Objective:   Physical Exam Well-developed young white female in no acute distress, pleasant blood pressure 100/80 pulse 80 height 5 foot 3 weight 126. HEENT; nontraumatic normocephalic EOMI PERRLA sclera anicteric, Supple; no JVD, Cardiovascular ;regular rate and rhythm with S1-S2 no murmur or gallop, Pulmonary clear bilaterally, Abdomen; soft she has some mild generalized tenderness no guarding or rebound no palpable mass or hepatomegaly bowel sounds are present, Rectal; exam not done, Extremities; no clubbing cyanosis or edema skin warm and dry, Psych; mood and affect normal and appropriate       Assessment & Plan:  #36 22 year old female with Crohn's ileocolitis status post ileocecal active main November 2013. No evidence for active disease since that time and maintained on 6-MP and Humira Now presenting with a five-day history of nausea abdominal cramping urgency and diarrhea. Patient is just completed 2 courses of Levaquin and therefore suspicion for C. difficile is high. Doubt current symptoms secondary to active Crohn's, cannot rule out a viral gastroenteritis.  #2 status post laparoscopic cholecystectomy November 2014 #3 history of ureteral lithiasis  Plan; CBC today, stool for C. difficile by PCR Start empiric Flagyl 250 mg  by mouth 4 times daily x14 days Start Florastor  1 by mouth twice daily x2-3 weeks Increase Levsin sublingual to 3 times daily half-hour before meals until feeling better Bland diet Refill Ultram Refill Phenergan 12.5-25 mg every 6 hours when necessary

## 2013-04-18 NOTE — Patient Instructions (Signed)
Go to the basement for labs Use Florastor for 2-3 weeks Your refills will be sent to your pharmacy Levsin increase to three times a day half hour before meals

## 2013-04-19 ENCOUNTER — Other Ambulatory Visit: Payer: BC Managed Care – PPO

## 2013-04-19 DIAGNOSIS — K5090 Crohn's disease, unspecified, without complications: Secondary | ICD-10-CM

## 2013-04-22 LAB — CLOSTRIDIUM DIFFICILE BY PCR: Toxigenic C. Difficile by PCR: NOT DETECTED

## 2013-04-26 ENCOUNTER — Telehealth: Payer: Self-pay | Admitting: Physician Assistant

## 2013-04-26 NOTE — Telephone Encounter (Signed)
Informed pt CDIFF was not detected and she stated , "What's Next?". Reviewed her chart and see where Mike Gip , PA treated her prophylacticly with  Flagyl. Requested pt stay on the meds until Amy returns. Asked pt how she is doing and she states she was doing better, but yesterday and today she had a couple of episodes of diarrhea; cramping only with the stools. Amy, please advise. Thanks.

## 2013-04-27 NOTE — Telephone Encounter (Signed)
Yes- she needs  to complete the course  Of flagyl and then have office follow up with [name] in 2-3 weeks

## 2013-04-27 NOTE — Telephone Encounter (Signed)
Patient advised.

## 2013-05-04 ENCOUNTER — Other Ambulatory Visit: Payer: Self-pay

## 2013-05-08 ENCOUNTER — Other Ambulatory Visit: Payer: Self-pay | Admitting: Physician Assistant

## 2013-05-10 ENCOUNTER — Other Ambulatory Visit: Payer: Self-pay | Admitting: *Deleted

## 2013-05-16 ENCOUNTER — Encounter: Payer: Self-pay | Admitting: Gastroenterology

## 2013-05-16 ENCOUNTER — Ambulatory Visit (INDEPENDENT_AMBULATORY_CARE_PROVIDER_SITE_OTHER): Payer: BC Managed Care – PPO | Admitting: Gastroenterology

## 2013-05-16 ENCOUNTER — Other Ambulatory Visit (INDEPENDENT_AMBULATORY_CARE_PROVIDER_SITE_OTHER): Payer: BC Managed Care – PPO

## 2013-05-16 VITALS — BP 110/70 | HR 70 | Ht 63.0 in | Wt 126.8 lb

## 2013-05-16 DIAGNOSIS — R112 Nausea with vomiting, unspecified: Secondary | ICD-10-CM

## 2013-05-16 DIAGNOSIS — K5080 Crohn's disease of both small and large intestine without complications: Secondary | ICD-10-CM

## 2013-05-16 DIAGNOSIS — R109 Unspecified abdominal pain: Secondary | ICD-10-CM

## 2013-05-16 MED ORDER — GLYCOPYRROLATE 1 MG PO TABS: 1.0000 mg | ORAL_TABLET | Freq: Two times a day (BID) | ORAL | Status: DC

## 2013-05-16 MED ORDER — PANTOPRAZOLE SODIUM 40 MG PO TBEC: 40.0000 mg | DELAYED_RELEASE_TABLET | Freq: Every day | ORAL | Status: DC

## 2013-05-16 MED ORDER — HYOSCYAMINE SULFATE 0.125 MG SL SUBL: SUBLINGUAL_TABLET | SUBLINGUAL | Status: DC

## 2013-05-16 NOTE — Progress Notes (Signed)
    History of Present Illness: This is a 23 year old female who relates intermittent difficulties with crampy lower abdominal pain and diarrhea occurring about 2-3 days each week for several months. On the other 4-5 days each week she feels well and will have formed or semi-formed bowel movements with no pain or any other symptoms. She also notes frequent nausea, occasional vomiting and heartburn symptoms on the days when her diarrhea and abdominal pain symptoms are active. CBC and C. difficile PCR were unremarkable 3 weeks ago.   Current Medications, Allergies, Past Medical History, Past Surgical History, Family History and Social History were reviewed in [employer] record.  Physical Exam: General: Well developed , well nourished, no acute distress Head: Normocephalic and atraumatic Eyes:  sclerae anicteric, EOMI Ears: Normal auditory acuity Mouth: No deformity or lesions Lungs: Clear throughout to auscultation Heart: Regular rate and rhythm; no murmurs, rubs or bruits Abdomen: Soft, non tender and non distended. No masses, hepatosplenomegaly or hernias noted. Normal Bowel sounds Musculoskeletal: Symmetrical with no gross deformities  Pulses:  Normal pulses noted Extremities: No clubbing, cyanosis, edema or deformities noted Neurological: Alert oriented x 4, grossly nonfocal Psychological:  Alert and cooperative. Normal mood and affect  Assessment and Recommendations:  1. Crohn's ileocolitis status post ileocecal resection November 2013. No evidence for active disease since that time and maintained on 6-MP and Humira.   2. 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 R/O celiac disease. The symptoms may have slightly worsened following her cholecystectomy. Begin glycopyrrolate 1 mg po bid and increase Levsin to 1-2 q4h prn. Begin FODMAP diet. Phenergan when necessary. Ultram when necessary.  3. Possible GERD. Pantoprazole 40 mg po daily. Antireflux measures.  4. Status post laparoscopic  cholecystectomy November 2014 for chronic cholecystitis.  5. History of ureterolithiasis

## 2013-05-16 NOTE — Patient Instructions (Signed)
Your physician has requested that you go to the basement for the following lab work before leaving today: [employer], Celiac panel.  We have sent the following medications to your pharmacy for you to pick up at your convenience: Robinul twice daily every day, Protonix once daily every day, and Levsin increased to 1-2 tablets by mouth every 4 hours as needed.   You have been given a Fodmap diet to follow.   Patient advised to avoid spicy, acidic, citrus, chocolate, mints, fruit and fruit juices.  Limit the intake of caffeine, alcohol and Soda.  Don't exercise too soon after eating.  Don't lie down within 3-4 hours of eating.  Elevate the head of your bed.  Thank you for choosing me and [facility].  Pricilla Riffle. Dagoberto Ligas., MD., [facility]

## 2013-05-17 LAB — CBC WITH DIFFERENTIAL/PLATELET
BASOS PCT: 0.6 % (ref 0.0–3.0)
Basophils Absolute: 0 10*3/uL (ref 0.0–0.1)
EOS PCT: 4.6 % (ref 0.0–5.0)
Eosinophils Absolute: 0.2 10*3/uL (ref 0.0–0.7)
HEMATOCRIT: 40.7 % (ref 36.0–46.0)
Hemoglobin: 13.7 g/dL (ref 12.0–15.0)
LYMPHS ABS: 1.5 10*3/uL (ref 0.7–4.0)
Lymphocytes Relative: 33.2 % (ref 12.0–46.0)
MCHC: 33.7 g/dL (ref 30.0–36.0)
MCV: 90 fl (ref 78.0–100.0)
MONO ABS: 0.3 10*3/uL (ref 0.1–1.0)
Monocytes Relative: 7.4 % (ref 3.0–12.0)
Neutro Abs: 2.4 10*3/uL (ref 1.4–7.7)
Neutrophils Relative %: 54.2 % (ref 43.0–77.0)
PLATELETS: 316 10*3/uL (ref 150.0–400.0)
RBC: 4.52 Mil/uL (ref 3.87–5.11)
RDW: 12.5 % (ref 11.5–14.6)
WBC: 4.4 10*3/uL — AB (ref 4.5–10.5)

## 2013-05-17 LAB — BASIC METABOLIC PANEL
BUN: 5 mg/dL — AB (ref 6–23)
CO2: 23 mEq/L (ref 19–32)
Calcium: 9.7 mg/dL (ref 8.4–10.5)
Chloride: 104 mEq/L (ref 96–112)
Creatinine, Ser: 0.6 mg/dL (ref 0.4–1.2)
GFR: 121.69 mL/min (ref >60.00)
Glucose, Bld: 89 mg/dL (ref 70–99)
POTASSIUM: 4 meq/L (ref 3.5–5.1)
SODIUM: 139 meq/L (ref 135–145)

## 2013-05-17 LAB — HEPATIC FUNCTION PANEL
ALT: 10 U/L (ref 0–35)
AST: 18 U/L (ref 0–37)
Albumin: 4.2 g/dL (ref 3.5–5.2)
Alkaline Phosphatase: 53 U/L (ref 39–117)
BILIRUBIN TOTAL: 0.7 mg/dL (ref 0.3–1.2)
Bilirubin, Direct: 0.1 mg/dL (ref 0.0–0.3)
Total Protein: 8.6 g/dL — ABNORMAL HIGH (ref 6.0–8.3)

## 2013-05-17 LAB — TSH: TSH: 0.97 u[IU]/mL (ref 0.35–5.50)

## 2013-05-18 ENCOUNTER — Telehealth: Payer: Self-pay | Admitting: Gastroenterology

## 2013-05-18 LAB — CELIAC PANEL 10
ENDOMYSIAL SCREEN: NEGATIVE
Gliadin IgA: 3.5 U/mL (ref <20)
Gliadin IgG: 135 U/mL — ABNORMAL HIGH (ref <20)
IgA: 286 mg/dL (ref 69–380)
TISSUE TRANSGLUTAMINASE AB, IGA: 4.8 U/mL (ref <20)
Tissue Transglut Ab: 14.9 U/mL (ref <20)

## 2013-05-18 NOTE — Telephone Encounter (Signed)
Questions answered.

## 2013-05-19 ENCOUNTER — Other Ambulatory Visit: Payer: Self-pay

## 2013-05-19 DIAGNOSIS — R197 Diarrhea, unspecified: Secondary | ICD-10-CM

## 2013-05-20 ENCOUNTER — Other Ambulatory Visit: Payer: BC Managed Care – PPO

## 2013-05-20 DIAGNOSIS — R197 Diarrhea, unspecified: Secondary | ICD-10-CM

## 2013-05-26 LAB — CELIAC DISEASE HLA DQ ASSOC.
DQ2 (DQA1 0501/0505, DQB1 02XX): NEGATIVE
DQ8 (DQA1 03XX, DQB1 0302): POSITIVE

## 2013-06-17 ENCOUNTER — Ambulatory Visit (INDEPENDENT_AMBULATORY_CARE_PROVIDER_SITE_OTHER): Payer: BC Managed Care – PPO | Admitting: Gastroenterology

## 2013-06-17 ENCOUNTER — Encounter: Payer: Self-pay | Admitting: Gastroenterology

## 2013-06-17 VITALS — BP 118/70 | HR 76 | Ht 63.0 in | Wt 137.2 lb

## 2013-06-17 DIAGNOSIS — R197 Diarrhea, unspecified: Secondary | ICD-10-CM

## 2013-06-17 DIAGNOSIS — K5080 Crohn's disease of both small and large intestine without complications: Secondary | ICD-10-CM

## 2013-06-17 NOTE — Progress Notes (Signed)
    History of Present Illness: This is a 23 year old female with Crohn's ileocolitis and a history of intermittent diarrhea and frequent nausea. She is status post cholecystectomy with lysis of adhesions in November. Her symptoms have substantially improved over the past few weeks. Her appetite is improved and she has gained weight from 126 to 137 since December. BMI has increased from 22.3 to 24.3 since December. She still has infrequent days with more diarrhea and abdominal pain. She frequently takes Phenergan for control of  intermittent nausea. Celiac markers were equivocal and upper endoscopy is scheduled.  Current Medications, Allergies, Past Medical History, Past Surgical History, Family History and Social History were reviewed in [employer] record.  Physical Exam: General: Well developed , well nourished, no acute distress Head: Normocephalic and atraumatic Eyes:  sclerae anicteric, EOMI Ears: Normal auditory acuity Mouth: No deformity or lesions Lungs: Clear throughout to auscultation Heart: Regular rate and rhythm; no murmurs, rubs or bruits Abdomen: Soft, generalized mild to moderate tenderness to palpation in the back and non distended. No masses, hepatosplenomegaly or hernias noted. Normal Bowel sounds Rectal: Musculoskeletal: Symmetrical with no gross deformities  Pulses:  Normal pulses noted Extremities: No clubbing, cyanosis, edema or deformities noted Neurological: Alert oriented x 4, grossly nonfocal Psychological:  Alert and cooperative. Normal mood and affect  Assessment and Recommendations:  1. Elevated celiac marker. The risks, benefits, and alternatives to endoscopy with possible biopsy and possible dilation were discussed with the patient and they consent to proceed.   2. Crohn's ileocolitis. Status post ileocecectomy 2013 Overall substantially improved with improved appetite and weight gain. Continue Humira and 6-MP. Blood work last month  unremarkable. Return office in 3 months.  3. Intermittent diarrhea and nausea. Suspected postsurgical or irritable bowel syndrome. Continue promethazine, glycopyrrolate, hyoscyamine.  4. Status post cholecystectomy and lysis of adhesions 02/2013. Overall substantially improved with improved appetite and weight gain.

## 2013-06-17 NOTE — Patient Instructions (Addendum)
You have been scheduled for an endoscopy with propofol. Please follow written instructions given to you at your visit today. If you use inhalers (even only as needed), please bring them with you on the day of your procedure.   Thank you for choosing me and [facility].  Pricilla Riffle. Dagoberto Ligas., MD., [facility]

## 2013-07-10 ENCOUNTER — Other Ambulatory Visit: Payer: Self-pay | Admitting: Physician Assistant

## 2013-07-12 ENCOUNTER — Other Ambulatory Visit: Payer: Self-pay | Admitting: *Deleted

## 2013-07-12 ENCOUNTER — Encounter: Payer: Self-pay | Admitting: Gastroenterology

## 2013-07-12 ENCOUNTER — Ambulatory Visit (AMBULATORY_SURGERY_CENTER): Payer: BC Managed Care – PPO | Admitting: Gastroenterology

## 2013-07-12 VITALS — BP 109/70 | HR 88 | Temp 99.4°F | Resp 18 | Ht 63.0 in | Wt 137.0 lb

## 2013-07-12 DIAGNOSIS — R7989 Other specified abnormal findings of blood chemistry: Secondary | ICD-10-CM

## 2013-07-12 DIAGNOSIS — K2970 Gastritis, unspecified, without bleeding: Secondary | ICD-10-CM

## 2013-07-12 DIAGNOSIS — R197 Diarrhea, unspecified: Secondary | ICD-10-CM

## 2013-07-12 DIAGNOSIS — R109 Unspecified abdominal pain: Secondary | ICD-10-CM

## 2013-07-12 DIAGNOSIS — K2100 Gastro-esophageal reflux disease with esophagitis, without bleeding: Secondary | ICD-10-CM

## 2013-07-12 DIAGNOSIS — K5080 Crohn's disease of both small and large intestine without complications: Secondary | ICD-10-CM

## 2013-07-12 DIAGNOSIS — K2940 Chronic atrophic gastritis without bleeding: Secondary | ICD-10-CM

## 2013-07-12 DIAGNOSIS — R112 Nausea with vomiting, unspecified: Secondary | ICD-10-CM

## 2013-07-12 DIAGNOSIS — K2990 Gastroduodenitis, unspecified, without bleeding: Secondary | ICD-10-CM

## 2013-07-12 MED ORDER — SODIUM CHLORIDE 0.9 % IV SOLN: 500.0000 mL | INTRAVENOUS | Status: DC

## 2013-07-12 MED ORDER — ONDANSETRON HCL 4 MG PO TABS: 4.0000 mg | ORAL_TABLET | Freq: Once | ORAL | Status: DC

## 2013-07-12 MED ORDER — PANTOPRAZOLE SODIUM 40 MG PO TBEC: DELAYED_RELEASE_TABLET | ORAL | Status: DC

## 2013-07-12 MED ORDER — ONDANSETRON 4 MG PO TBDP
4.0000 mg | ORAL_TABLET | Freq: Once | ORAL | Status: AC
  Administered 2013-07-12: 4 mg via ORAL

## 2013-07-12 NOTE — Progress Notes (Signed)
Called to room to assist during endoscopic procedure.  Patient ID and intended procedure confirmed with present staff. Received instructions for my participation in the procedure from the performing physician.  

## 2013-07-12 NOTE — Progress Notes (Addendum)
C/o nausea doctor Fuller Plan gave Verbal order for Zofran 4mg   Sublingual. Medication given per order. No other complaints voiced, pt. Discharge per protocol.

## 2013-07-12 NOTE — Op Note (Signed)
[facility]  [address] [place], [address]   ENDOSCOPY PROCEDURE REPORT  PATIENT: Robin, Miller  MR#: [id number] BIRTHDATE: 1990-05-24 , 22  yrs. old GENDER: Female ENDOSCOPIST: Ladene Artist, MD, [facility] PROCEDURE DATE:  07/12/2013 PROCEDURE:  EGD w/ biopsy ASA CLASS:     Class II INDICATIONS:  Unexplained diarrhea.   elevated celiac antibody. MEDICATIONS: MAC sedation, administered by CRNA and propofol (Diprivan) 300mg  IV TOPICAL ANESTHETIC: Cetacaine Spray DESCRIPTION OF PROCEDURE: After the risks benefits and alternatives of the procedure were thoroughly explained, informed consent was obtained.  The LB [id number] endoscope was introduced through the mouth and advanced to the second portion of the duodenum. Without limitations.  The instrument was slowly withdrawn as the mucosa was fully examined.  ESOPHAGUS: There was LA Class B esophagitis noted.   A variable Z-line was observed 38 cm from the incisors.  Multiple biopsies were performed.   The esophagus was otherwise normal. STOMACH: Moderate erosive gastritis  was found on the greater curvature of the gastric body.  Multiple biopsies were performed. The stomach otherwise appeared normal. DUODENUM: The duodenal mucosa showed no abnormalities in the bulb and second portion of the duodenum.  Cold forceps biopsies were taken in the bulb and second portion.  Retroflexed views revealed no abnormalities.     The scope was then withdrawn from the patient and the procedure completed.  COMPLICATIONS: There were no complications.  ENDOSCOPIC IMPRESSION: 1.   LA Class B esophagitis 2.   Variable Z-line at 38 cm from the incisors; multiple biopsies 3.   Erosive gastritis (inflammation) was found on the greater curvature of the gastric body; multiple biopsies  RECOMMENDATIONS: 1.  Anti-reflux regimen long term 2.  Avoid ASA/NSAID products 3.  Await pathology results 4.  PPI bid; pantoprazole 40 mg  po bid (ac),  1 year of refills 5.  OP follow-up in 4-6 weeks.  eSigned:  Ladene Artist, MD, [facility] 07/12/2013 2:40 PM

## 2013-07-12 NOTE — Progress Notes (Signed)
Report to pacu rn, vss, bbs=clear 

## 2013-07-12 NOTE — Patient Instructions (Signed)
YOU HAD AN ENDOSCOPIC PROCEDURE TODAY AT THE [facility]: Refer to the procedure report that was given to you for any specific questions about what was found during the examination.  If the procedure report does not answer your questions, please call your gastroenterologist to clarify.  If you requested that your care partner not be given the details of your procedure findings, then the procedure report has been included in a sealed envelope for you to review at your convenience later.  YOU SHOULD EXPECT: Some feelings of bloating in the abdomen. Passage of more gas than usual.  Walking can help get rid of the air that was put into your GI tract during the procedure and reduce the bloating. If you had a lower endoscopy (such as a colonoscopy or flexible sigmoidoscopy) you may notice spotting of blood in your stool or on the toilet paper. If you underwent a bowel prep for your procedure, then you may not have a normal bowel movement for a few days.  DIET: Your first meal following the procedure should be a light meal and then it is ok to progress to your normal diet.  A half-sandwich or bowl of soup is an example of a good first meal.  Heavy or fried foods are harder to digest and may make you feel nauseous or bloated.  Likewise meals heavy in dairy and vegetables can cause extra gas to form and this can also increase the bloating.  Drink plenty of fluids but you should avoid alcoholic beverages for 24 hours.  ACTIVITY: Your care partner should take you home directly after the procedure.  You should plan to take it easy, moving slowly for the rest of the day.  You can resume normal activity the day after the procedure however you should NOT DRIVE or use heavy machinery for 24 hours (because of the sedation medicines used during the test).    SYMPTOMS TO REPORT IMMEDIATELY: A gastroenterologist can be reached at any hour.  During normal business hours, 8:30 AM to 5:00 PM Monday through Friday,  call [phone].  After hours and on weekends, please call the GI answering service at [phone] who will take a message and have the physician on call contact you.    Following upper endoscopy (EGD)  Vomiting of blood or coffee ground material  New chest pain or pain under the shoulder blades  Painful or persistently difficult swallowing  New shortness of breath  Fever of 100F or higher  Black, tarry-looking stools  FOLLOW UP: If any biopsies were taken you will be contacted by phone or by letter within the next 1-3 weeks.  Call your gastroenterologist if you have not heard about the biopsies in 3 weeks.  Our staff will call the home number listed on your records the next business day following your procedure to check on you and address any questions or concerns that you may have at that time regarding the information given to you following your procedure. This is a courtesy call and so if there is no answer at the home number and we have not heard from you through the emergency physician on call, we will assume that you have returned to your regular daily activities without incident.  SIGNATURES/CONFIDENTIALITY: You and/or your care partner have signed paperwork which will be entered into your electronic medical record.  These signatures attest to the fact that that the information above on your After Visit Summary has been reviewed and is understood.  Full   responsibility of the confidentiality of this discharge information lies with you and/or your care-partner.  Avoid aspirin and NSAIDS, information given on esophagitis, gastritis with discharge instructions. Call office to schedule follow up for appointment 4-6 weeks.

## 2013-07-13 ENCOUNTER — Telehealth: Payer: Self-pay | Admitting: *Deleted

## 2013-07-13 NOTE — Telephone Encounter (Signed)
  Follow up Call-  Call back number 07/12/2013 11/13/2011  Post procedure Call Back phone  # [phone] cell [phone]  Permission to leave phone message Yes Yes     Patient questions:  Do you have a fever, pain , or abdominal swelling? no Pain Score  0 *  Have you tolerated food without any problems? yes  Have you been able to return to your normal activities? yes  Do you have any questions about your discharge instructions: Diet   no Medications  no Follow up visit  no  Do you have questions or concerns about your Care? no  Actions: * If pain score is 4 or above: No action needed, pain <4.

## 2013-07-18 ENCOUNTER — Encounter: Payer: Self-pay | Admitting: Gastroenterology

## 2013-08-04 ENCOUNTER — Other Ambulatory Visit: Payer: Self-pay | Admitting: Gastroenterology

## 2013-08-22 ENCOUNTER — Ambulatory Visit (INDEPENDENT_AMBULATORY_CARE_PROVIDER_SITE_OTHER): Payer: BC Managed Care – PPO | Admitting: Gastroenterology

## 2013-08-22 ENCOUNTER — Other Ambulatory Visit (INDEPENDENT_AMBULATORY_CARE_PROVIDER_SITE_OTHER): Payer: BC Managed Care – PPO

## 2013-08-22 ENCOUNTER — Encounter: Payer: Self-pay | Admitting: Gastroenterology

## 2013-08-22 VITALS — BP 110/60 | HR 88 | Ht 64.0 in | Wt 129.5 lb

## 2013-08-22 DIAGNOSIS — K219 Gastro-esophageal reflux disease without esophagitis: Secondary | ICD-10-CM

## 2013-08-22 DIAGNOSIS — K2990 Gastroduodenitis, unspecified, without bleeding: Secondary | ICD-10-CM

## 2013-08-22 DIAGNOSIS — K5080 Crohn's disease of both small and large intestine without complications: Secondary | ICD-10-CM

## 2013-08-22 DIAGNOSIS — K2970 Gastritis, unspecified, without bleeding: Secondary | ICD-10-CM

## 2013-08-22 LAB — CBC WITH DIFFERENTIAL/PLATELET
Basophils Absolute: 0 10*3/uL (ref 0.0–0.1)
Basophils Relative: 0.5 % (ref 0.0–3.0)
Eosinophils Absolute: 0.2 10*3/uL (ref 0.0–0.7)
Eosinophils Relative: 2 % (ref 0.0–5.0)
HEMATOCRIT: 41.4 % (ref 36.0–46.0)
HEMOGLOBIN: 14 g/dL (ref 12.0–15.0)
Lymphocytes Relative: 15.3 % (ref 12.0–46.0)
Lymphs Abs: 1.2 10*3/uL (ref 0.7–4.0)
MCHC: 33.8 g/dL (ref 30.0–36.0)
MCV: 87.2 fl (ref 78.0–100.0)
MONOS PCT: 5.4 % (ref 3.0–12.0)
Monocytes Absolute: 0.4 10*3/uL (ref 0.1–1.0)
NEUTROS ABS: 6.1 10*3/uL (ref 1.4–7.7)
Neutrophils Relative %: 76.8 % (ref 43.0–77.0)
PLATELETS: 305 10*3/uL (ref 150.0–400.0)
RBC: 4.75 Mil/uL (ref 3.87–5.11)
RDW: 13.7 % (ref 11.5–14.6)
WBC: 7.9 10*3/uL (ref 4.5–10.5)

## 2013-08-22 LAB — COMPREHENSIVE METABOLIC PANEL
ALK PHOS: 59 U/L (ref 39–117)
ALT: 9 U/L (ref 0–35)
AST: 17 U/L (ref 0–37)
Albumin: 4.2 g/dL (ref 3.5–5.2)
BUN: 8 mg/dL (ref 6–23)
CALCIUM: 9.8 mg/dL (ref 8.4–10.5)
CO2: 27 mEq/L (ref 19–32)
Chloride: 103 mEq/L (ref 96–112)
Creatinine, Ser: 0.6 mg/dL (ref 0.4–1.2)
GFR: 125.95 mL/min (ref >60.00)
GLUCOSE: 86 mg/dL (ref 70–99)
Potassium: 4.1 mEq/L (ref 3.5–5.1)
Sodium: 138 mEq/L (ref 135–145)
Total Bilirubin: 0.6 mg/dL (ref 0.3–1.2)
Total Protein: 8.5 g/dL — ABNORMAL HIGH (ref 6.0–8.3)

## 2013-08-22 LAB — LIPASE: LIPASE: 35 U/L (ref 11.0–59.0)

## 2013-08-22 MED ORDER — ESOMEPRAZOLE MAGNESIUM 40 MG PO CPDR: 40.0000 mg | DELAYED_RELEASE_CAPSULE | Freq: Two times a day (BID) | ORAL | Status: DC

## 2013-08-22 MED ORDER — DEXLANSOPRAZOLE 60 MG PO CPDR: 60.0000 mg | DELAYED_RELEASE_CAPSULE | Freq: Every day | ORAL | Status: DC

## 2013-08-22 NOTE — Patient Instructions (Signed)
Your physician has requested that you go to the basement for the following lab work before leaving today: CBC, Cmet, and Lipase.  Discontinue pantoprazole.  Start Dexilant samples taking one tablet by mouth once daily until finished. Then pick up the prescription for Nexium from your pharmacy to take one tablet by mouth twice daily.   Thank you for choosing me and [facility].  Pricilla Riffle. Dagoberto Ligas., MD., [facility]

## 2013-08-22 NOTE — Progress Notes (Signed)
    History of Present Illness: This is a 23 year old female complaining of intermittent upper, pain with intermittent nausea and vomiting. These symptoms have improved on pantoprazole twice daily but have not resolved. She notes small amounts of bright red blood per rectum with bowel movements for the past several weeks. She complains of feeling fatigued and run down for 2-3 days after her Humira injections and is wondering about changing to a different biologic.  Current Medications, Allergies, Past Medical History, Past Surgical History, Family History and Social History were reviewed in [employer] record.  Physical Exam: General: Well developed , well nourished, no acute distress Head: Normocephalic and atraumatic Eyes:  sclerae anicteric, EOMI Ears: Normal auditory acuity Mouth: No deformity or lesions Lungs: Clear throughout to auscultation Heart: Regular rate and rhythm; no murmurs, rubs or bruits Abdomen: Soft, non tender and non distended. No masses, hepatosplenomegaly or hernias noted. Normal Bowel sounds Musculoskeletal: Symmetrical with no gross deformities  Pulses:  Normal pulses noted Extremities: No clubbing, cyanosis, edema or deformities noted Neurological: Alert oriented x 4, grossly nonfocal Psychological:  Alert and cooperative. Normal mood and affect  Assessment and Recommendations:  1. GERD with erosive esophagitis. Erosive gastritis. Some improvement in her upper abdominal pain and intermittent vomiting since increasing pantoprazole to twice a day but her symptoms are not under adequate control. Trial of dexlansoprazole 60 mg daily and then esomeprazole 40 mg twice daily.  2. Crohn's ileocolitis. Status post ileocecectomy 2013 Overall improved with improved appetite and weight gain. Small amounts of bright red blood with bowel movements.   Fatigued and run down for 2-3 days after each Humira injection. Will discuss options with my colleagues  regarding modifying her biologic. Continue Humira and 6-MP. Blood work today Return office in 6 weeks.   3. Intermittent diarrhea. Suspected postsurgical or irritable bowel syndrome. Continue promethazine, glycopyrrolate, hyoscyamine.   4. Status post cholecystectomy and lysis of adhesions 02/2013. Overall substantially improved with improved appetite and weight gain.

## 2013-08-24 ENCOUNTER — Telehealth: Payer: Self-pay

## 2013-08-24 ENCOUNTER — Other Ambulatory Visit: Payer: Self-pay | Admitting: Gastroenterology

## 2013-08-24 NOTE — Telephone Encounter (Signed)
Try omeprazole 40 mg bid or Dexilant 60 mg qam

## 2013-08-24 NOTE — Telephone Encounter (Signed)
Received a fax denial after innitiating a PA for Nexium 40 mg twice daily. Called pharmacy to ask if generic or brand name makes a difference and it does not. They just will not cover twice daily dosing. Patient has already tried and failed Protonix in the past. Do you have a preference on PPI since her insurance may not cover twice a day dosing?

## 2013-08-25 MED ORDER — OMEPRAZOLE 40 MG PO CPDR: 40.0000 mg | DELAYED_RELEASE_CAPSULE | Freq: Two times a day (BID) | ORAL | Status: DC

## 2013-08-25 NOTE — Telephone Encounter (Signed)
Sent new prescription for omeprazole 40 mg twice a day.

## 2013-08-29 ENCOUNTER — Other Ambulatory Visit: Payer: Self-pay | Admitting: Gastroenterology

## 2013-09-03 NOTE — Progress Notes (Signed)
   Subjective:    Patient ID: Robin Miller, female    DOB: 15-Dec-1990, 23 y.o.   MRN: [id number]  HPI 24 year old Female with history of Crohn's disease in today with cough and congestion. Has malaise and fatigue and some wheezing. No shaking chills. Low-grade fever.    Review of Systems     Objective:   Physical Exam Skin warm and dry. Nodes none. HEENT exam: Pharynx slightly injected. TMs clear. Neck supple. Chest some scattered wheezing bilaterally. Chest x-ray no evidence of pneumonia.       Assessment & Plan:  Bronchitis  History of Crohn's disease  Plan: Albuterol inhaler 2 sprays by mouth 4 times a day for cough and congestion. Levaquin 500 milligrams daily for 7 days. Tessalon Perles 200 mg 3 times daily as needed for cough.

## 2013-09-03 NOTE — Progress Notes (Signed)
   Subjective:    Patient ID: Robin Miller, female    DOB: 03-16-1991, 23 y.o.   MRN: [id number]  HPI Was seen December 2 and diagnosed with acute bronchitis. Chest x-ray was negative. History of Crohn's disease. Was treated with Levaquin for 7 days, albuterol inhaler and Tessalon Perles. Still not feeling well.  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 Is being treated with Humira.  Review of Systems     Objective:   Physical Exam Skin warm and dry. Nodes none. TMs and pharynx are clear. Neck is supple without adenopathy. Chest scattered wheezing without rales       Assessment & Plan:  Acute bronchitis  Crohn's disease  Plan: 1 g IM Rocephin given today. Continue Tessalon Perles and albuterol inhaler. Continue Levaquin and call next week for progress report.

## 2013-09-03 NOTE — Patient Instructions (Signed)
Use albuterol inhaler 4 times daily. Take Tessalon Perles as directed. Levaquin 500 milligrams daily for 7 days.

## 2013-09-04 NOTE — Progress Notes (Signed)
   Subjective:    Patient ID: Robin Miller, female    DOB: [birth year], 23 y.o.   MRN: [id number]  HPI Patient returns today for followup on bronchitis. Is finally beginning to feel a bit better. Chest x-ray done on initial visit was negative. Has  improvement has been slow She's felt very fatigued. She's been treated with albuterol inhaler, Tessalon Perles, Levaquin and at last visit on December 5 was given 1 g IM Rocephin.    Review of Systems     Objective:   Physical Exam Chest clear to auscultation. Skin warm and dry.       Assessment & Plan:  Acute bronchitis-resolving  Plan: Symbicort inhaler sample given to use in addition to albuterol inhaler. Refill Levaquin for an additional 7 days. Continue albuterol inhaler and Tessalon Perles.

## 2013-09-15 ENCOUNTER — Telehealth: Payer: Self-pay | Admitting: Gastroenterology

## 2013-09-15 NOTE — Telephone Encounter (Signed)
Agree with plan 

## 2013-09-15 NOTE — Telephone Encounter (Signed)
Patient reports red rash to abdomen, back, and arms.  She is wondering if it is the Nexium because it is the newest medication. No changes to soaps, lotions, washing powder etc.  She is asked to hold nexium, start on benadryl q 6 hours.  She is also asked to see her primary care.  She will call back if the primary care feels rash is related to one of her GI meds.

## 2013-09-19 ENCOUNTER — Other Ambulatory Visit: Payer: Self-pay | Admitting: Gastroenterology

## 2013-10-04 ENCOUNTER — Encounter: Payer: Self-pay | Admitting: Gastroenterology

## 2013-10-04 ENCOUNTER — Ambulatory Visit (INDEPENDENT_AMBULATORY_CARE_PROVIDER_SITE_OTHER): Payer: BC Managed Care – PPO | Admitting: Gastroenterology

## 2013-10-04 VITALS — BP 100/62 | HR 68 | Ht 63.0 in | Wt 132.8 lb

## 2013-10-04 DIAGNOSIS — K2100 Gastro-esophageal reflux disease with esophagitis, without bleeding: Secondary | ICD-10-CM

## 2013-10-04 DIAGNOSIS — K5080 Crohn's disease of both small and large intestine without complications: Secondary | ICD-10-CM

## 2013-10-04 MED ORDER — TRAMADOL HCL 50 MG PO TABS: 50.0000 mg | ORAL_TABLET | Freq: Four times a day (QID) | ORAL | Status: DC | PRN

## 2013-10-04 NOTE — Progress Notes (Signed)
    History of Present Illness: This is a 23 year old female Crohn's ileocolitis, GERD with esophagitis and IBS. Overall she has less severe and less frequent abdominal pain and diarrhea. Her reflux symptoms are under good control but she occasionally has breakthrough symptoms. She has occasional small amounts of bright blood with bowel movements.  Current Medications, Allergies, Past Medical History, Past Surgical History, Family History and Social History were reviewed in [employer] record.  Physical Exam: General: Well developed , well nourished, no acute distress Head: Normocephalic and atraumatic Eyes:  sclerae anicteric, EOMI Ears: Normal auditory acuity Mouth: No deformity or lesions Lungs: Clear throughout to auscultation Heart: Regular rate and rhythm; no murmurs, rubs or bruits Abdomen: Soft, mild diffuse tenderness to light palpation and non distended. No masses, hepatosplenomegaly or hernias noted. Normal Bowel sounds Musculoskeletal: Symmetrical with no gross deformities  Pulses:  Normal pulses noted Extremities: No clubbing, cyanosis, edema or deformities noted Neurological: Alert oriented x 4, grossly nonfocal Psychological:  Alert and cooperative. Normal mood and affect  Assessment and Recommendations:  1. GERD with erosive esophagitis. Erosive gastritis. Continue omeprazole 40 mg twice a day, standard antireflux measures and TUMS as needed for breakthrough symptoms.  2. Crohn's ileocolitis. Status post ileocecectomy 2013 Overall improved with improved appetite and weight gain. Less abdominal pain. Small amounts of bright red blood with bowel movements and occasional diarrhea. Fatigued and run down for 2-3 days after each Humira injection. Will discuss options with my colleagues regarding modifying or change in her biologic. Temporarily she is advised to take Tylenol alternating with ibuprofen several hours before her Humira injection for 2-3 days  afterwards. Continue Humira and 6-MP. Blood work today Return office in 8 weeks.   3. Intermittent diarrhea and nausea. Suspected irritable bowel syndrome. Continue promethazine, glycopyrrolate, hyoscyamine.   4. Status post cholecystectomy and lysis of adhesions 02/2013. Overall substantially improved with improved appetite and weight gain.

## 2013-10-04 NOTE — Patient Instructions (Signed)
We have sent the following medications to your pharmacy for you to pick up at your convenience: Tramadol.  Take over the counter Tums as needed for breakthrough reflux symptoms.  Thank you for choosing me and [facility].  Pricilla Riffle. Dagoberto Ligas., MD., Marval Regal

## 2013-10-06 ENCOUNTER — Telehealth: Payer: Self-pay

## 2013-10-06 DIAGNOSIS — K5090 Crohn's disease, unspecified, without complications: Secondary | ICD-10-CM

## 2013-10-06 NOTE — Telephone Encounter (Signed)
Patient notified she will come one day this week.  Orders sent to the lab and placed in [facility]

## 2013-10-06 NOTE — Telephone Encounter (Signed)
Message copied by Marlon Pel on Thu Oct 06, 2013 11:13 AM ------      Message from: Lucio Edward T      Created: Wed Oct 05, 2013  1:16 PM       Please contact Kamber Vignola regarding her problems with potential Humira side effects. I discussed with colleagues and [employer] Anser panel to check Humira drug levels and antibody levels would be the next step to better understand how this drug is working for her and this is the next step to possibly changing Humira. [facility] printed out some forms to use for ordering these but I misplaced them. Did I give them to you already? ------

## 2013-11-01 NOTE — Telephone Encounter (Signed)
Patient has not come for [employer]  I have left her a detailed message asking her to come to the lab or give me a call to further discuss if she has questions

## 2013-11-07 ENCOUNTER — Ambulatory Visit: Payer: BC Managed Care – PPO

## 2013-11-07 DIAGNOSIS — K5090 Crohn's disease, unspecified, without complications: Secondary | ICD-10-CM

## 2013-11-08 LAB — PROMETHEUS-MAIL

## 2013-11-30 ENCOUNTER — Encounter: Payer: Self-pay | Admitting: Gastroenterology

## 2013-11-30 ENCOUNTER — Ambulatory Visit (INDEPENDENT_AMBULATORY_CARE_PROVIDER_SITE_OTHER): Payer: BC Managed Care – PPO | Admitting: Gastroenterology

## 2013-11-30 VITALS — BP 164/70 | HR 84 | Ht 64.0 in | Wt 133.1 lb

## 2013-11-30 DIAGNOSIS — K50818 Crohn's disease of both small and large intestine with other complication: Secondary | ICD-10-CM

## 2013-11-30 DIAGNOSIS — K5080 Crohn's disease of both small and large intestine without complications: Secondary | ICD-10-CM

## 2013-11-30 NOTE — Progress Notes (Signed)
    History of Present Illness: This is a 23 year old female returning for follow up of Crohn's ileocolitis, GERD with esophagitis and IBS. She continues to have lower abd pain, diarrhea and occasional rectal bleeding on most days. I reviewed a symptoms diary that she kept for the past several weeks and she has symptoms most every day, often in the afternoons.  [employer] Anser levels were obtained on July 13. She states she injected a full dose Humira on July 2. Her adalimumab-serum drug concentration was < 1.6 and antibody concentration was < 1.7. We had a discussion about medication compliance prior to obtaining Anser levels and she endorsed compliance with Humira usage as prescribed.  After the Anser results we contacted the patient's pharmacy. According to her pharmacy she has not picked up a prescription of Humira since November 2014. She began Humira with a few supervised and assisted injections. The number of doses that have been obtained by the patient cover, at most, half the Humira doses that she should have taken since it was started in 2014. She claims that she may have missed a Humira dose on a couple occasions but states that she has generally taken the medication as prescribed, including the July 2 dose.  Current Medications, Allergies, Past Medical History, Past Surgical History, Family History and Social History were reviewed in [employer] record.  Physical Exam: General: Well developed , well nourished, no acute distress Psychological:  Alert and cooperative. Normal mood and affect No additional physical exam performed  Assessment and Recommendations:  1. Crohn's ileocolitis. Anser testing indicates either noncompliance with Humira or that she is a rapid metabolizer of Humira. We discussed the significant discrepancies between her reports of compliance with Humira and the number of prescriptions that she has filled that would, at best, cover half of the time  she was supposed to be taking Humira. We discussed no new Humira obtained from her pharmacy since Nov 2014. I am unsure about when she has been taking Humira and when she has not. She offered no reasonable explanation for the discrepancies. Given that there has been inadequate compliance and inaccurate communication on her part about using Humira correctly I've advised her that I am no longer able to provide her care and that she will need to seek care with another gastroenterologist. I wished her success in managing her disease.

## 2013-11-30 NOTE — Patient Instructions (Signed)
Your are being provided phone numbers for other Gastroenterologist in [place]. You will need to seek care with another Gastroenterologist.   Dr. Juanita Craver : phone # [phone] Dr. [facility]: phone # [phone]

## 2013-12-01 ENCOUNTER — Telehealth: Payer: Self-pay | Admitting: Internal Medicine

## 2013-12-01 ENCOUNTER — Telehealth: Payer: Self-pay | Admitting: Gastroenterology

## 2013-12-01 ENCOUNTER — Other Ambulatory Visit: Payer: Self-pay

## 2013-12-01 NOTE — Telephone Encounter (Signed)
Dismissal Letter sent by Certified Mail 70/76/1518  Received the Return Receipt showing someone picked up the Dismissal 12/14/2013

## 2013-12-01 NOTE — Telephone Encounter (Signed)
She was dismissed for noncompliance with medication. She will need to come in and see me.

## 2013-12-02 NOTE — Telephone Encounter (Signed)
Spoke with patient and advised that she was actually dismissed from Dr. Lynne Leader practice due to her non compliance with her medications.  Therefore, Dr. Renold Genta wants to see her in the office to discuss this issue and then make a referral as necessary.  Patient given appointment for 8/17 @ 9:45.  Patient verbalized understanding and confirmed this day/time.    [name] spoke with patient's pharmacy:  Humira Pen 40mg /0.11ml filled 11/10/13, 09/14/13 and 08/04/13. The last 2 times, Rx were never picked up.  11/10/13 Rx was cancelled.    Dr. Renold Genta will discuss noncompliance of medications during her visit on 8/17.

## 2013-12-08 ENCOUNTER — Encounter: Payer: Self-pay | Admitting: Gastroenterology

## 2013-12-12 ENCOUNTER — Ambulatory Visit (INDEPENDENT_AMBULATORY_CARE_PROVIDER_SITE_OTHER): Payer: BC Managed Care – PPO | Admitting: Internal Medicine

## 2013-12-12 ENCOUNTER — Encounter: Payer: Self-pay | Admitting: Internal Medicine

## 2013-12-12 VITALS — BP 116/74 | Temp 98.9°F | Ht 64.0 in | Wt 133.0 lb

## 2013-12-12 DIAGNOSIS — K5090 Crohn's disease, unspecified, without complications: Secondary | ICD-10-CM

## 2013-12-12 DIAGNOSIS — K50919 Crohn's disease, unspecified, with unspecified complications: Secondary | ICD-10-CM

## 2013-12-12 DIAGNOSIS — R1013 Epigastric pain: Secondary | ICD-10-CM

## 2013-12-12 MED ORDER — PROMETHAZINE HCL 12.5 MG PO TABS: ORAL_TABLET | ORAL | Status: DC

## 2013-12-12 NOTE — Patient Instructions (Signed)
Take Nexium instead of Prilosec. Phenergan has been refill. Try Levsin before meals for nausea.

## 2013-12-12 NOTE — Progress Notes (Signed)
   Subjective:    Patient ID: Robin Miller, female    DOB: [birth year], 23 y.o.   MRN: [id number]  HPI  Patient came in at my request to discuss management of her Crohn's disease. She said there was a misunderstanding between herself and Dr. Fuller Plan. She says that when she was sick with bronchitis and of Crohn's disease she did not pick up some of her Humira at the drugstore and therefore it was felt she was not taking it.  She is having epigastric pain. She has nausea with meals. I have refilled her Phenergan today. She's been on Prilosec. Have asked her to get Nexium filled instead. She has ulcers in her mouth. She says she last had an endoscopy in the spring of this year.  She needs to find another gastroenterologist to assume her care.  Eats one to 2 meals daily. Says she's either constipated or has diarrhea with a little bit of blood  Sometimes takes TUMS in addition to PPI.    Review of Systems     Objective:   Physical Exam  Abdomen: No hepatosplenomegaly. Abdomen is soft and nondistended. Very mild tenderness epigastrium and to the right of umbilicus without rebound      Assessment & Plan:  Crohn's disease  Plan: Arrange appointment with Gastroenterology. In the meantime she will call me if she gets into trouble or needs refills on her medications. She is taking Humira every 14 days.

## 2014-01-19 ENCOUNTER — Encounter: Payer: Self-pay | Admitting: Internal Medicine

## 2014-01-19 ENCOUNTER — Ambulatory Visit (INDEPENDENT_AMBULATORY_CARE_PROVIDER_SITE_OTHER): Payer: BC Managed Care – PPO | Admitting: Internal Medicine

## 2014-01-19 VITALS — BP 118/70 | HR 100 | Temp 99.5°F | Ht 64.0 in | Wt 134.0 lb

## 2014-01-19 DIAGNOSIS — Z8719 Personal history of other diseases of the digestive system: Secondary | ICD-10-CM

## 2014-01-19 DIAGNOSIS — R509 Fever, unspecified: Secondary | ICD-10-CM

## 2014-01-19 DIAGNOSIS — J029 Acute pharyngitis, unspecified: Secondary | ICD-10-CM

## 2014-01-19 DIAGNOSIS — J040 Acute laryngitis: Secondary | ICD-10-CM

## 2014-01-19 LAB — POCT RAPID STREP A (OFFICE): Rapid Strep A Screen: NEGATIVE

## 2014-01-19 MED ORDER — HYDROCODONE-HOMATROPINE 5-1.5 MG/5ML PO SYRP: 5.0000 mL | ORAL_SOLUTION | Freq: Three times a day (TID) | ORAL | Status: DC | PRN

## 2014-01-19 MED ORDER — LEVOFLOXACIN 500 MG PO TABS: 500.0000 mg | ORAL_TABLET | Freq: Every day | ORAL | Status: DC

## 2014-01-19 NOTE — Progress Notes (Signed)
   Subjective:    Patient ID: Robin Miller, female    DOB: [birth year], 23 y.o.   MRN: [id number]  HPI  Onset laryngitis x 48 hours. Both ears hurt. Throat and chest hurts. No shaking chills. Complains of myalgias and malaise. History of Crohn's disease.    Review of Systems     Objective:   Physical Exam Pharynx slightly injected. Rapid strep screen negative. TMs are clear. Neck is supple. Chest clear. Has laryngitis.       Assessment & Plan:  Laryngitis  Acute pharyngitis-non-strep  History of Crohn's disease  Anxiety about illness  Plan: Levaquin 500 milligrams daily for 7 days. Rest and drink plenty of fluids. Tylenol if needed for fever or myalgias.  20 minutes spent with patient

## 2014-01-20 NOTE — Patient Instructions (Signed)
Take Levaquin 500 milligrams daily for 7 days. Drink plenty of fluids and rest. Stay out of cold damp weather. Tylenol as needed for fever and myalgias

## 2014-04-09 ENCOUNTER — Encounter: Payer: Self-pay | Admitting: Internal Medicine

## 2014-05-31 ENCOUNTER — Other Ambulatory Visit: Payer: Self-pay

## 2014-06-05 LAB — CYTOLOGY - PAP

## 2014-06-08 DIAGNOSIS — H109 Unspecified conjunctivitis: Secondary | ICD-10-CM

## 2014-06-09 ENCOUNTER — Telehealth: Payer: Self-pay | Admitting: *Deleted

## 2014-06-09 MED ORDER — OFLOXACIN 0.3 % OP SOLN: 2.0000 [drp] | Freq: Four times a day (QID) | OPHTHALMIC | Status: DC

## 2014-06-09 NOTE — Telephone Encounter (Signed)
Call in Ocuflox opthalmic solution 2 gtts ou qid x 5 days

## 2014-06-09 NOTE — Telephone Encounter (Signed)
Patient called states they have had an outbreak of conjunctivitis at the daycare she woke up with her right eye red and itching wants to know if we can call something in for her. Please advise

## 2014-06-09 NOTE — Telephone Encounter (Signed)
Sent in script for Ocuflox notified patient

## 2014-06-19 ENCOUNTER — Other Ambulatory Visit: Payer: Self-pay | Admitting: Internal Medicine

## 2014-06-20 NOTE — Telephone Encounter (Signed)
Does she still have pink eye? If so, needs to see opthalmologist

## 2014-06-20 NOTE — Telephone Encounter (Signed)
Patient states it has spread to left eye she doesn't have enough drops to complete therapy in that eye

## 2014-07-09 ENCOUNTER — Emergency Department (HOSPITAL_BASED_OUTPATIENT_CLINIC_OR_DEPARTMENT_OTHER): Payer: 59

## 2014-07-09 ENCOUNTER — Emergency Department (HOSPITAL_BASED_OUTPATIENT_CLINIC_OR_DEPARTMENT_OTHER)
Admission: EM | Admit: 2014-07-09 | Discharge: 2014-07-10 | Disposition: A | Discharge status: Home / Self Care | Payer: 59 | Attending: Emergency Medicine | Admitting: Emergency Medicine

## 2014-07-09 DIAGNOSIS — Z79899 Other long term (current) drug therapy: Secondary | ICD-10-CM | POA: Insufficient documentation

## 2014-07-09 DIAGNOSIS — Z3202 Encounter for pregnancy test, result negative: Secondary | ICD-10-CM | POA: Insufficient documentation

## 2014-07-09 DIAGNOSIS — Z85828 Personal history of other malignant neoplasm of skin: Secondary | ICD-10-CM | POA: Insufficient documentation

## 2014-07-09 DIAGNOSIS — Z793 Long term (current) use of hormonal contraceptives: Secondary | ICD-10-CM | POA: Diagnosis not present

## 2014-07-09 DIAGNOSIS — Z8659 Personal history of other mental and behavioral disorders: Secondary | ICD-10-CM | POA: Diagnosis not present

## 2014-07-09 DIAGNOSIS — R109 Unspecified abdominal pain: Secondary | ICD-10-CM | POA: Diagnosis present

## 2014-07-09 DIAGNOSIS — Z862 Personal history of diseases of the blood and blood-forming organs and certain disorders involving the immune mechanism: Secondary | ICD-10-CM | POA: Diagnosis not present

## 2014-07-09 DIAGNOSIS — Z792 Long term (current) use of antibiotics: Secondary | ICD-10-CM | POA: Diagnosis not present

## 2014-07-09 DIAGNOSIS — K5090 Crohn's disease, unspecified, without complications: Secondary | ICD-10-CM | POA: Diagnosis not present

## 2014-07-09 DIAGNOSIS — G8929 Other chronic pain: Secondary | ICD-10-CM | POA: Insufficient documentation

## 2014-07-09 DIAGNOSIS — Z9049 Acquired absence of other specified parts of digestive tract: Secondary | ICD-10-CM | POA: Insufficient documentation

## 2014-07-09 DIAGNOSIS — K5909 Other constipation: Secondary | ICD-10-CM | POA: Diagnosis not present

## 2014-07-09 DIAGNOSIS — R1030 Lower abdominal pain, unspecified: Secondary | ICD-10-CM

## 2014-07-09 LAB — URINALYSIS, ROUTINE W REFLEX MICROSCOPIC
BILIRUBIN URINE: NEGATIVE
Glucose, UA: NEGATIVE mg/dL
Hgb urine dipstick: NEGATIVE
Ketones, ur: NEGATIVE mg/dL
Nitrite: NEGATIVE
Protein, ur: NEGATIVE mg/dL
Specific Gravity, Urine: 1.007 (ref 1.005–1.030)
Urobilinogen, UA: 1 mg/dL (ref 0.0–1.0)
pH: 6 (ref 5.0–8.0)

## 2014-07-09 LAB — COMPREHENSIVE METABOLIC PANEL
ALBUMIN: 3.5 g/dL (ref 3.5–5.2)
ALT: 29 U/L (ref 0–35)
AST: 40 U/L — AB (ref 0–37)
Alkaline Phosphatase: 165 U/L — ABNORMAL HIGH (ref 39–117)
Anion gap: 7 (ref 5–15)
BUN: 7 mg/dL (ref 6–23)
CALCIUM: 8.7 mg/dL (ref 8.4–10.5)
CO2: 23 mmol/L (ref 19–32)
CREATININE: 0.62 mg/dL (ref 0.50–1.10)
Chloride: 109 mmol/L (ref 96–112)
GFR calc non Af Amer: >90 mL/min (ref >90)
Glucose, Bld: 101 mg/dL — ABNORMAL HIGH (ref 70–99)
Potassium: 3.1 mmol/L — ABNORMAL LOW (ref 3.5–5.1)
Sodium: 139 mmol/L (ref 135–145)
TOTAL PROTEIN: 7.4 g/dL (ref 6.0–8.3)
Total Bilirubin: 0.5 mg/dL (ref 0.3–1.2)

## 2014-07-09 LAB — CBC
HCT: 37.6 % (ref 36.0–46.0)
Hemoglobin: 12.5 g/dL (ref 12.0–15.0)
MCH: 29.3 pg (ref 26.0–34.0)
MCHC: 33.2 g/dL (ref 30.0–36.0)
MCV: 88.1 fL (ref 78.0–100.0)
PLATELETS: 203 10*3/uL (ref 150–400)
RBC: 4.27 MIL/uL (ref 3.87–5.11)
RDW: 13.5 % (ref 11.5–15.5)
WBC: 4.9 10*3/uL (ref 4.0–10.5)

## 2014-07-09 LAB — LIPASE, BLOOD: Lipase: 41 U/L (ref 11–59)

## 2014-07-09 LAB — URINE MICROSCOPIC-ADD ON

## 2014-07-09 LAB — PREGNANCY, URINE: PREG TEST UR: NEGATIVE

## 2014-07-09 MED ORDER — HYDROMORPHONE HCL 1 MG/ML IJ SOLN
1.0000 mg | Freq: Once | INTRAMUSCULAR | Status: AC
Start: 2014-07-09 — End: 2014-07-09
  Administered 2014-07-09: 1 mg via INTRAVENOUS
  Filled 2014-07-09: qty 1

## 2014-07-09 MED ORDER — SODIUM CHLORIDE 0.9 % IV BOLUS (SEPSIS)
1000.0000 mL | Freq: Once | INTRAVENOUS | Status: AC
  Administered 2014-07-09: 1000 mL via INTRAVENOUS

## 2014-07-09 MED ORDER — ONDANSETRON HCL 4 MG/2ML IJ SOLN
4.0000 mg | Freq: Once | INTRAMUSCULAR | Status: AC
  Administered 2014-07-09: 4 mg via INTRAVENOUS
  Filled 2014-07-09: qty 2

## 2014-07-09 MED ORDER — IOHEXOL 300 MG/ML  SOLN: 50.0000 mL | Freq: Once | INTRAMUSCULAR | Status: AC | PRN

## 2014-07-09 NOTE — ED Notes (Signed)
Dr. Linker at [facility] 

## 2014-07-09 NOTE — ED Provider Notes (Signed)
CSN: [id number]     Arrival date & time 07/09/14  5726 History  This chart was scribed for Alfonzo Beers, MD by Peyton Bottoms, ED Scribe. This patient was seen in room MH03/MH03 and the patient's care was started at 9:32 PM.   Chief Complaint  Patient presents with  . Abdominal Pain   Patient is a 24 y.o. female presenting with abdominal pain. The history is provided by the patient. No language interpreter was used.  Abdominal Pain Pain location:  Epigastric Pain quality: aching, cramping and squeezing   Pain radiates to:  Does not radiate Pain severity:  Moderate Onset quality:  Gradual Duration:  2 weeks Timing:  Constant Progression:  Worsening Chronicity:  Chronic Relieved by:  Nothing Worsened by:  Nothing tried Ineffective treatments:  Antacids Associated symptoms: constipation, nausea and vomiting    HPI Comments: Robin Miller is a 23 y.o. female with a PMHx of Crohn's disease with fistula and stenosis of ileocecal valve s/p ileocectomy, vasovagal syncope, anemia, cancer, erosive esophagitis and GERD, who presents to the Emergency Department complaining of moderate, gradually worsening cramping periumbilical abdominal pain that began 2 weeks ago. She currently states that the abdominal pain is intermittent and sharp. Her last bowel movement was 2 weeks ago. She states she tried taking laxatives with no relief. She reports associated nausea and vomiting with 5 episodes of vomiting in the past 24 hours. She denies associated fever. She states that she takes Humira injections, nausea medication and pain medication. She states she took hyoscyamine sulfate yesterday with no relief. Patient was taking tramadol prior to onset of symptoms.  Past Medical History  Diagnosis Date  . Depression   . Vasovagal syncope   . Anemia   . Nausea vomiting and diarrhea   . Cancer     basal cell back and face  . Crohn's disease with fistula & stenosis of ileocecal valve s/p ileocectomy  03/16/2012 03/17/2012  . Crohn's ileocolitis 11/04/2011  . Erosive esophagitis     [place] Class B  . GERD (gastroesophageal reflux disease)    Past Surgical History  Procedure Laterality Date  . Tonsillectomy and adenoidectomy    . Skin cancer excision    . Ileocetomy  03/16/2012  . Laparoscopic ileocecectomy  03/16/2012    Procedure: LAPAROSCOPIC ILEOCECECTOMY;  Surgeon: Adin Hector, MD;  Location: [facility];  Service: General;  Laterality: N/A;  lap assisted ileocectomy  . Holmium laser application  06/01/5595    Procedure: HOLMIUM LASER APPLICATION;  Surgeon: Dutch Gray, MD;  Location: [facility];  Service: Urology;  Laterality: Right;     . Cystoscopy with retrograde pyelogram, ureteroscopy and stent placement  05/27/2012    Procedure: CYSTOSCOPY WITH RETROGRADE PYELOGRAM, URETEROSCOPY AND STENT PLACEMENT;  Surgeon: Dutch Gray, MD;  Location: [facility];  Service: Urology;  Laterality: Right;  . Esophagogastroduodenoscopy N/A 03/04/2013    Procedure: ESOPHAGOGASTRODUODENOSCOPY (EGD);  Surgeon: Jerene Bears, MD;  Location: [facility];  Service: Endoscopy;  Laterality: N/A;  . Colonoscopy N/A 03/07/2013    Procedure: COLONOSCOPY;  Surgeon: Gatha Mayer, MD;  Location: [facility];  Service: Endoscopy;  Laterality: N/A;  . Cholecystectomy N/A 03/09/2013    Procedure: LAPAROSCOPIC CHOLECYSTECTOMY;  Surgeon: Gayland Curry, MD;  Location: [facility];  Service: General;  Laterality: N/A;  . Colonoscopy    . Upper gastrointestinal endoscopy     Family History  Problem Relation Age of Onset  . Stroke Mother   . Breast cancer Mother   .  Breast cancer Paternal Aunt   . Crohn's disease Father   . Colitis Maternal Aunt   . Colon cancer Paternal Grandmother     ? cancer, part of colon removed  . Stroke Paternal Grandmother   . Esophageal cancer Neg Hx   . Pancreatic cancer Neg Hx   . Rectal cancer Neg Hx   . Stomach cancer Neg Hx    History  Substance Use Topics  . Smoking status: Never Smoker   .  Smokeless tobacco: Never Used  . Alcohol Use: No   OB History    No data available     Review of Systems  Gastrointestinal: Positive for nausea, vomiting, abdominal pain and constipation.  All other systems reviewed and are negative.  Allergies  Prednisone  Home Medications   Prior to Admission medications   Medication Sig Start Date End Date Taking? Authorizing Provider  DM-Phenylephrine-Acetaminophen [facility] WARMING RELIEF COLD PO) Take by mouth.    Historical Provider, MD  docusate sodium (COLACE) 100 MG capsule Take 1 capsule (100 mg total) by mouth every 12 (twelve) hours. 07/10/14   Alfonzo Beers, MD  esomeprazole (NEXIUM) 20 MG capsule Take 20 mg by mouth daily at 12 noon.    Historical Provider, MD  etonogestrel-ethinyl estradiol (NUVARING) 0.12-0.015 MG/24HR vaginal ring Place 1 each vaginally every 28 (twenty-eight) days. Insert vaginally and leave in place for 3 consecutive weeks, then remove for 1 week.     Historical Provider, MD  glycopyrrolate (ROBINUL) 1 MG tablet Take 1 tablet (1 mg total) by mouth 2 (two) times daily. 05/16/13   Ladene Artist, MD  HUMIRA PEN 40 MG/0.8ML injection Inject 37m under skin every 14 days following completion of starter kit. 08/04/13   MLadene Artist MD  HYDROcodone-homatropine ([facility] 5-1.5 MG/5ML syrup Take 5 mLs by mouth every 8 (eight) hours as needed for cough. 01/19/14   MElby Showers MD  hyoscyamine (LEVSIN/SL) 0.125 MG SL tablet 1-2 tablets by mouth or under tongue every 4 hours as needed 05/16/13   MLadene Artist MD  levofloxacin (LEVAQUIN) 500 MG tablet Take 1 tablet (500 mg total) by mouth daily. 01/19/14   MElby Showers MD  mercaptopurine (PURINETHOL) 50 MG tablet Take 50 mg by mouth daily. Give on an empty stomach 1 hour before or 2 hours after meals. Caution: Chemotherapy.    Historical Provider, MD  Multiple Vitamin (MULTIVITAMIN WITH MINERALS) TABS tablet Take 1 tablet by mouth daily.    Historical Provider, MD  ofloxacin  (OCUFLOX) 0.3 % ophthalmic solution Place 2 drops into the left eye 4 (four) times daily. 06/20/14   MElby Showers MD  ondansetron (ZOFRAN) 4 MG tablet Take 1 tablet (4 mg total) by mouth every 6 (six) hours. 07/10/14   MAlfonzo Beers MD  oxyCODONE-acetaminophen (PERCOCET/ROXICET) 5-325 MG per tablet Take 1-2 tablets by mouth every 6 (six) hours as needed for severe pain. 07/10/14   MAlfonzo Beers MD  polyethylene glycol ([facility] packet Take 17 g by mouth daily. 07/10/14   MAlfonzo Beers MD  promethazine (PHENERGAN) 12.5 MG tablet Take one or two tablets by mouth every six hours as needed  for nausea 12/12/13   MElby Showers MD  traMADol (ULTRAM) 50 MG tablet Take 1 tablet (50 mg total) by mouth every 6 (six) hours as needed. 10/04/13   MLadene Artist MD   Triage Vitals: BP 124/85 mmHg  Pulse 121  Temp(Src) 98.4 F (36.9 C) (Oral)  Resp 18  Wt  135 lb (61.236 kg)  SpO2 100%  LMP 06/25/2014 Vitals reviewed Physical Exam  Physical Examination: General appearance - alert, well appearing, and in no distress Mental status - alert, oriented to person, place, and time Eyes - no conjunctival injection, no scleral icterus Mouth - mucous membranes moist, pharynx normal without lesions Chest - clear to auscultation, no wheezes, rales or rhonchi, symmetric air entry Heart - normal rate, regular rhythm, normal S1, S2, no murmurs, rubs, clicks or gallops Abdomen - soft, mild diffuse tenderness to palpation, no gaurding or rebound tenderness, nondistended, no masses or organomegaly Extremities - peripheral pulses normal, no pedal edema, no clubbing or cyanosis Skin - normal coloration and turgor, no rashes  ED Course  Procedures (including critical care time)  DIAGNOSTIC STUDIES: Oxygen Saturation is 100% on RA, normal by my interpretation.    COORDINATION OF CARE: 9:37 PM- Discussed plans to order diagnostic lab work, urinalysis and CT scan of abdomen. Will give patient IV fluids. Pt advised of plan  for treatment and pt agrees.  Labs Review Labs Reviewed  COMPREHENSIVE METABOLIC PANEL - Abnormal; Notable for the following:    Potassium 3.1 (*)    Glucose, Bld 101 (*)    AST 40 (*)    Alkaline Phosphatase 165 (*)    All other components within normal limits  URINALYSIS, ROUTINE W REFLEX MICROSCOPIC - Abnormal; Notable for the following:    Leukocytes, UA TRACE (*)    All other components within normal limits  CBC  LIPASE, BLOOD  PREGNANCY, URINE  URINE MICROSCOPIC-ADD ON   Imaging Review Ct Abdomen Pelvis W Contrast  07/10/2014   CLINICAL DATA:  periumbilical abdominal pain, 2 weeks duration  EXAM: CT ABDOMEN AND PELVIS WITH CONTRAST  TECHNIQUE: Multidetector CT imaging of the abdomen and pelvis was performed using the standard protocol following bolus administration of intravenous contrast.  CONTRAST:  78m OMNIPAQUE IOHEXOL 300 MG/ML SOLN, 1032mOMNIPAQUE IOHEXOL 300 MG/ML SOLN  COMPARISON:  03/02/2013  FINDINGS: There are normal appearances of the liver, spleen, pancreas, adrenals and kidneys. There is cholecystectomy. There is prior surgery with bowel anastomosis in the region of the cecum and distal ileum. There is no bowel obstruction. There is no extraluminal air. There is mild mural enhancement of a segment of distal ileum in the right lower quadrant, visible on series 2, image 56. This could represent mild inflammatory change associated with the known Crohn's disease. However, there is no significant dilatation of small bowel proximal to this. There also is a suggestion of a stricture in the distal small bowel, seen on coronal image 35, series 5, but without significant dilatation of bowel proximal to this. There is a large volume of stool throughout the colon. No ascites or adenopathy is present in the abdomen or pelvis. No significant abnormalities evident in the lower chest. No significant musculoskeletal abnormality is evident.  IMPRESSION: Large volume colonic stool. No  obstruction, perforation or ascites. Question mild mural enhancement of distal small bowel. Question non obstructing stricture in the distal small bowel.   Electronically Signed   By: DaAndreas Newport.D.   On: 07/10/2014 00:43     EKG Interpretation None     MDM   Final diagnoses:  Lower abdominal pain  Crohn's disease, without complications  Other constipation    Pt pwith hx of crohns disease presenting with worsening of her chronic abdominal pain in the setting of constipation.  Labs reassuring, HR is improved with fluids and meds in the ED.  Signed out to Dr. Florina Ou at end of shift pending CT scan.  If this is normal will treat with pain control and constipation meds.   I personally performed the services described in this documentation, which was scribed in my presence. The recorded information has been reviewed and is accurate.   Alfonzo Beers, MD 07/11/14 [phone]

## 2014-07-09 NOTE — ED Notes (Signed)
Pt reports hx crohns and denies BM x 2 weeks admits to N/V  With 5 emesis in past 24 hrs moderate amount pain worse today

## 2014-07-09 NOTE — ED Notes (Signed)
Attempting to drink PO contrast, tolerating poorly "d/t taste".

## 2014-07-09 NOTE — ED Notes (Signed)
C/o constipation x2 weeks, usually has diarrhea, also reports nv, unsure of fever (denies: diarrhea or bleeding), "has been awhile since last flare up, never completely in remission". Pt alert, NAD, calm, interactive, resps e/u, no dyspnea noted. Up to b/r, steady gait.

## 2014-07-10 MED ORDER — DOCUSATE SODIUM 100 MG PO CAPS: 100.0000 mg | ORAL_CAPSULE | Freq: Two times a day (BID) | ORAL | Status: DC

## 2014-07-10 MED ORDER — IOHEXOL 300 MG/ML  SOLN
50.0000 mL | Freq: Once | INTRAMUSCULAR | Status: AC | PRN
  Administered 2014-07-09: 50 mL via ORAL

## 2014-07-10 MED ORDER — POLYETHYLENE GLYCOL 3350 17 G PO PACK: 17.0000 g | PACK | Freq: Every day | ORAL | Status: DC

## 2014-07-10 MED ORDER — HYDROMORPHONE HCL 1 MG/ML IJ SOLN
1.0000 mg | Freq: Once | INTRAMUSCULAR | Status: AC
  Administered 2014-07-10: 1 mg via INTRAVENOUS

## 2014-07-10 MED ORDER — ONDANSETRON HCL 4 MG PO TABS: 4.0000 mg | ORAL_TABLET | Freq: Four times a day (QID) | ORAL | Status: DC

## 2014-07-10 MED ORDER — HYDROMORPHONE HCL 1 MG/ML IJ SOLN
INTRAMUSCULAR | Status: AC
  Filled 2014-07-10: qty 1

## 2014-07-10 MED ORDER — IOHEXOL 300 MG/ML  SOLN
100.0000 mL | Freq: Once | INTRAMUSCULAR | Status: AC | PRN
  Administered 2014-07-10: 100 mL via INTRAVENOUS

## 2014-07-10 MED ORDER — OXYCODONE-ACETAMINOPHEN 5-325 MG PO TABS: 1.0000 | ORAL_TABLET | Freq: Four times a day (QID) | ORAL | Status: DC | PRN

## 2014-07-10 NOTE — Discharge Instructions (Signed)
Return to the ED with any concerns including vomiting and not able to keep down liquids, worsening pain, fever/chills, decreased level of alertness/lethargy, or any other alarming symptoms

## 2014-07-10 NOTE — ED Notes (Signed)
Pain returned, pain med given, pt to CT.

## 2014-12-05 IMAGING — CR DG ABDOMEN ACUTE W/ 1V CHEST
3 series · 3 of 3 positions shown · non-contrast
Comparison: CT 08/11/2012.  Chest 07/30/2007.

CLINICAL DATA: Diffuse abdominal pain. Nausea. Vomiting. History of
Crohn's disease. History of renal calculus on the right.

EXAM:
ACUTE ABDOMEN SERIES (ABDOMEN 2 VIEW & CHEST 1 VIEW)

[w chest pa]
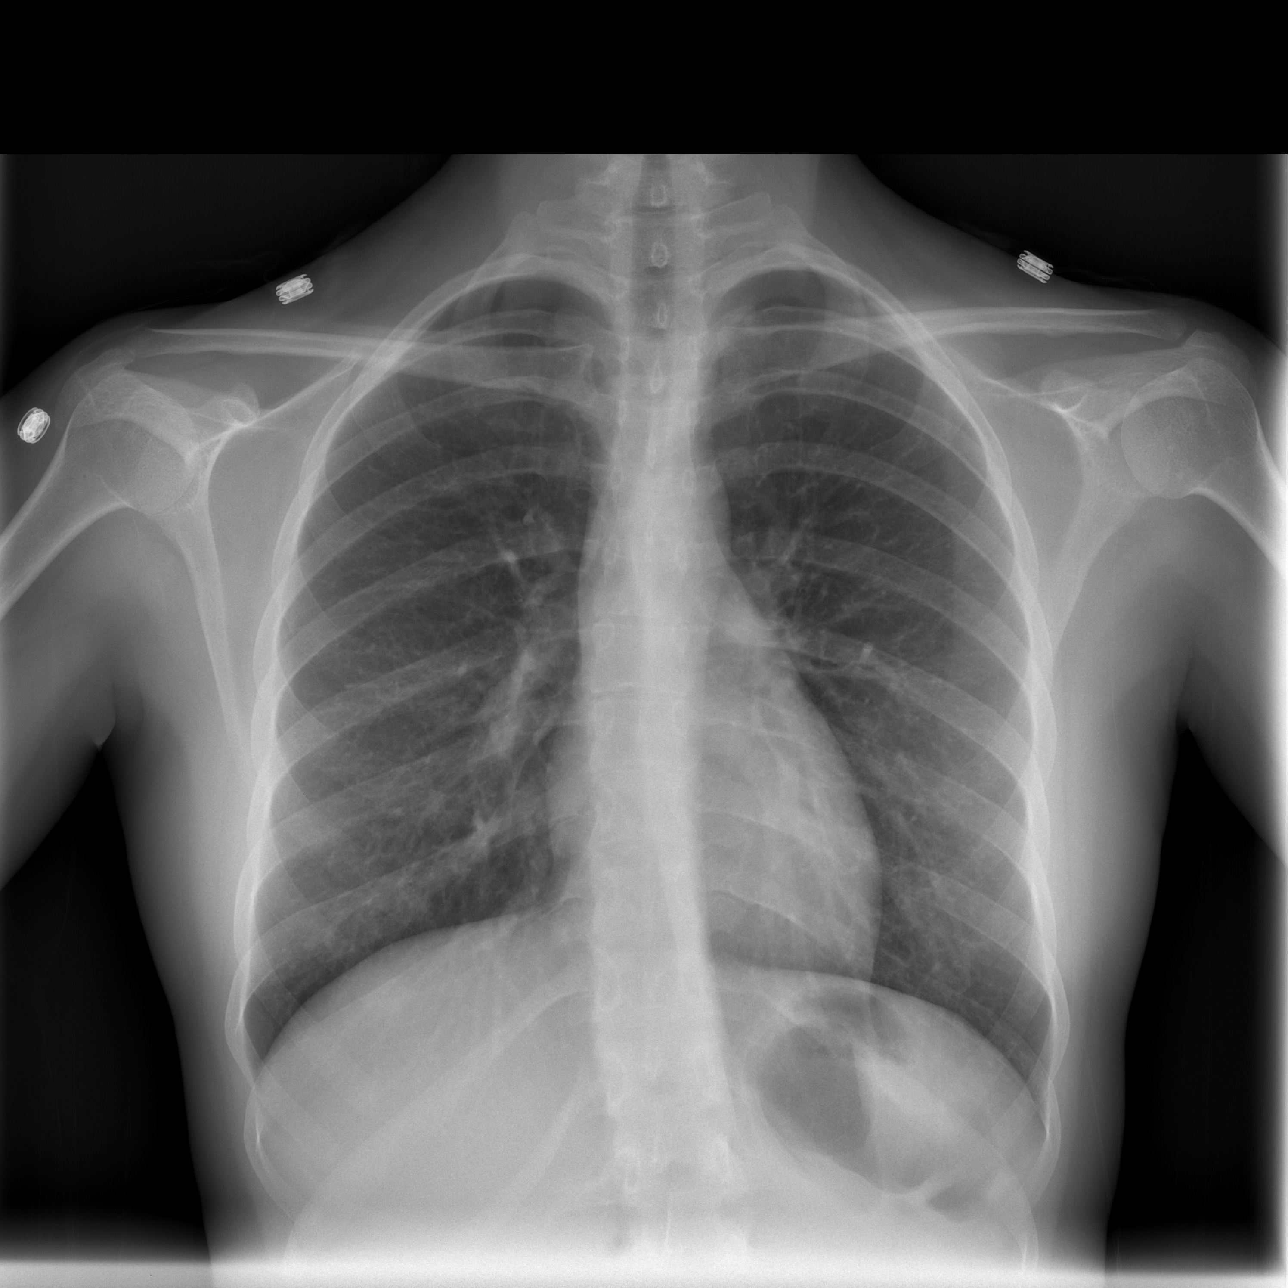

[w abdomen upright *]
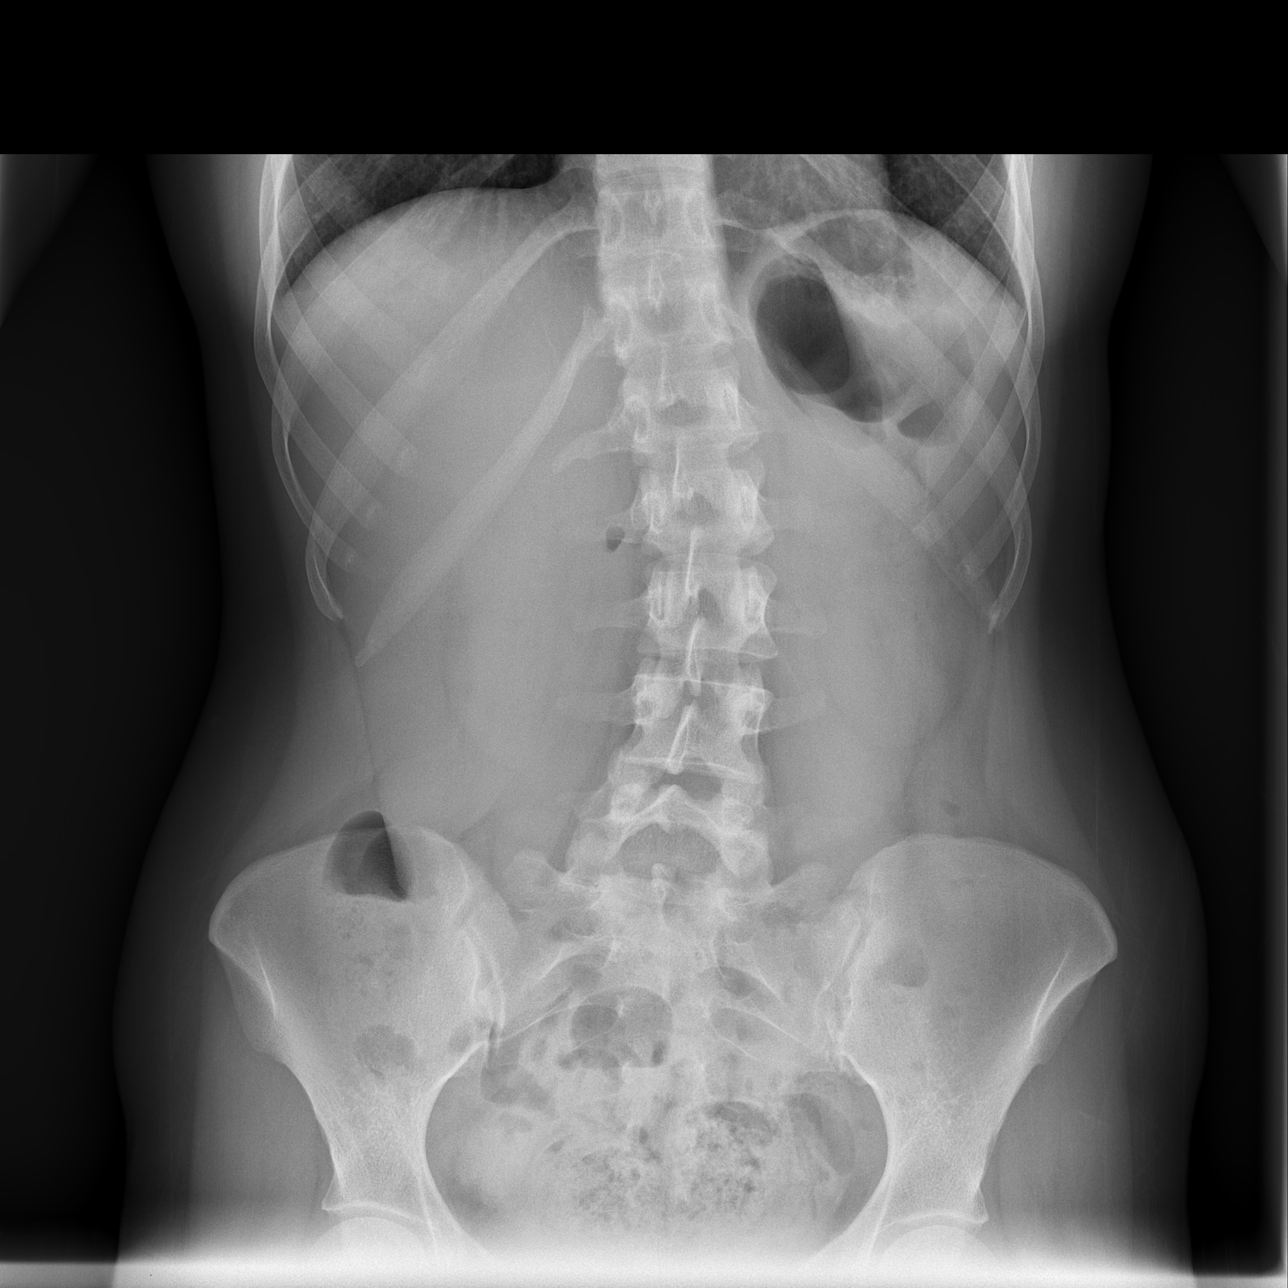

[t abdomen supine]
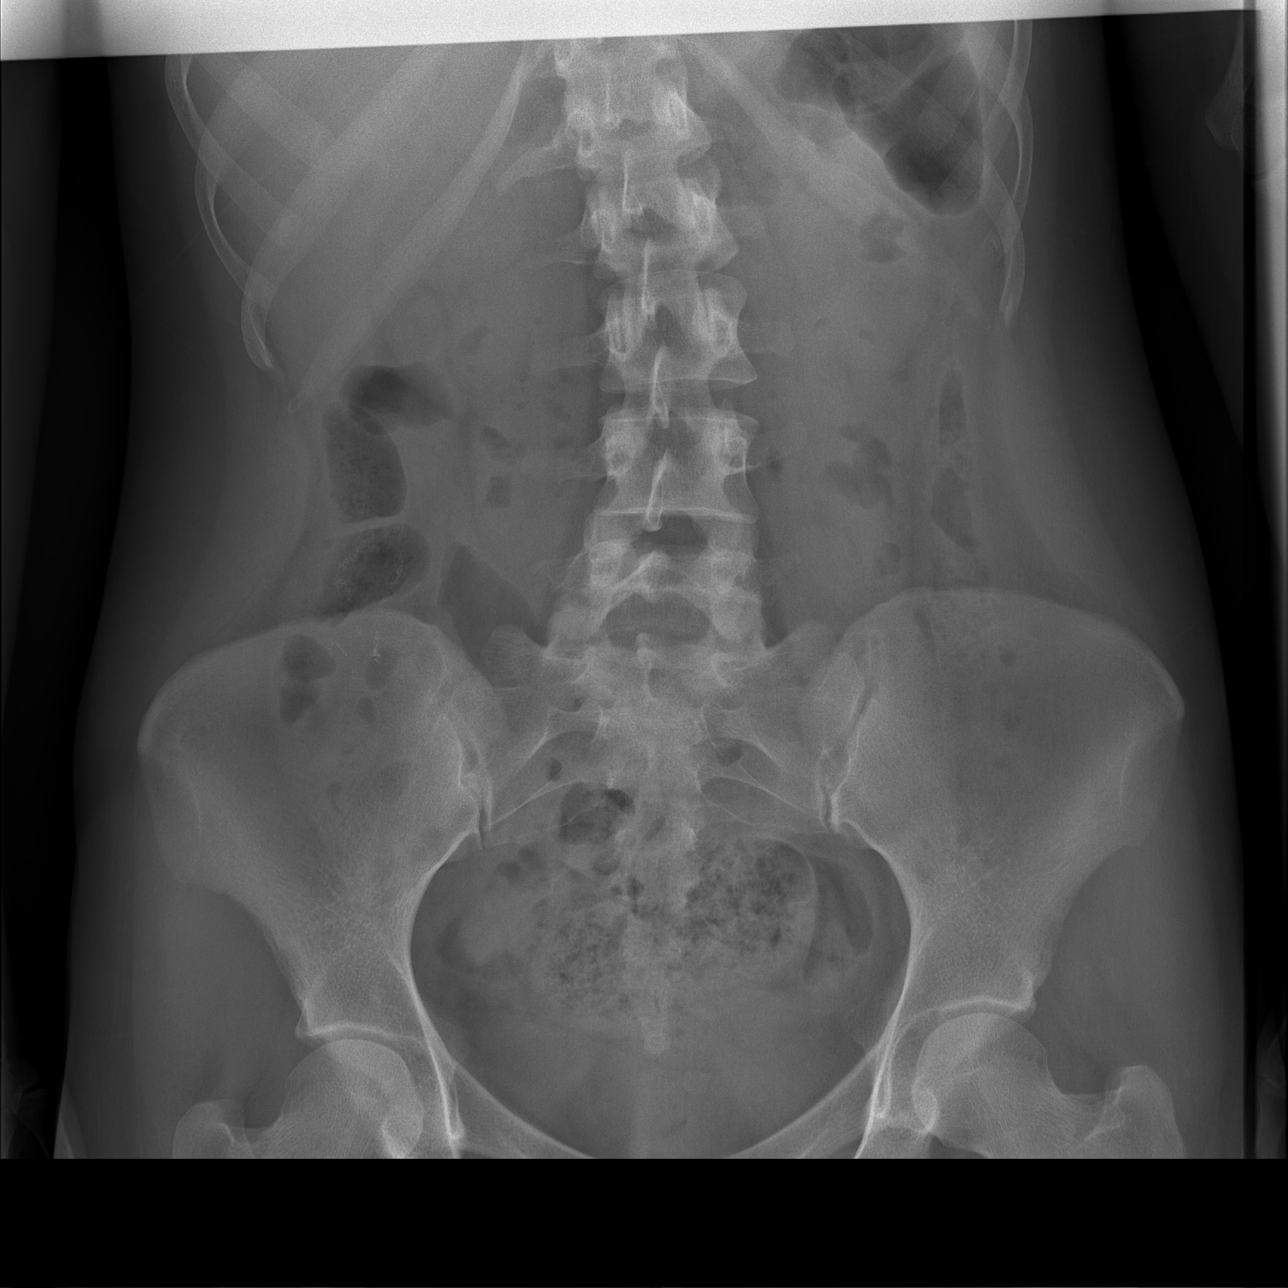

[3 of 3 positions shown; findings below may reference images not displayed]

FINDINGS: Cardiac silhouette is normal size shape. No mediastinal or hilar
lesions are seen. Mediastinal and hilar contours appear stable.
Lungs are free of infiltrates. No masses are seen. No pleural
disease is evident. There is thoracic scoliosis convexity to the
right.

No pneumoperitoneum is evident. Bowel gas is nonspecific and within
limits of normal. No opaque calculi are seen. There is lumbar
scoliosis convexity to the left. There is moderate fecal distention
of colon in the pelvis.
IMPRESSION: No acute or active cardiopulmonary or pleural abnormalities are
seen. Thoracolumbar scoliosis is present.

No pneumoperitoneum is evident. Bowel gas is nonspecific and within
limits of normal. No opaque calculi are seen. There is moderate
fecal distention of colon in the pelvis.

## 2015-01-03 IMAGING — CR DG CHEST 2V
2 series · 2 of 2 positions shown · non-contrast
Comparison: 03/05/2013

CLINICAL DATA: Chest tightness.  Cough.  Fever.  Congestion.

EXAM:
CHEST  2 VIEW

[view not recorded (1 of 2)]
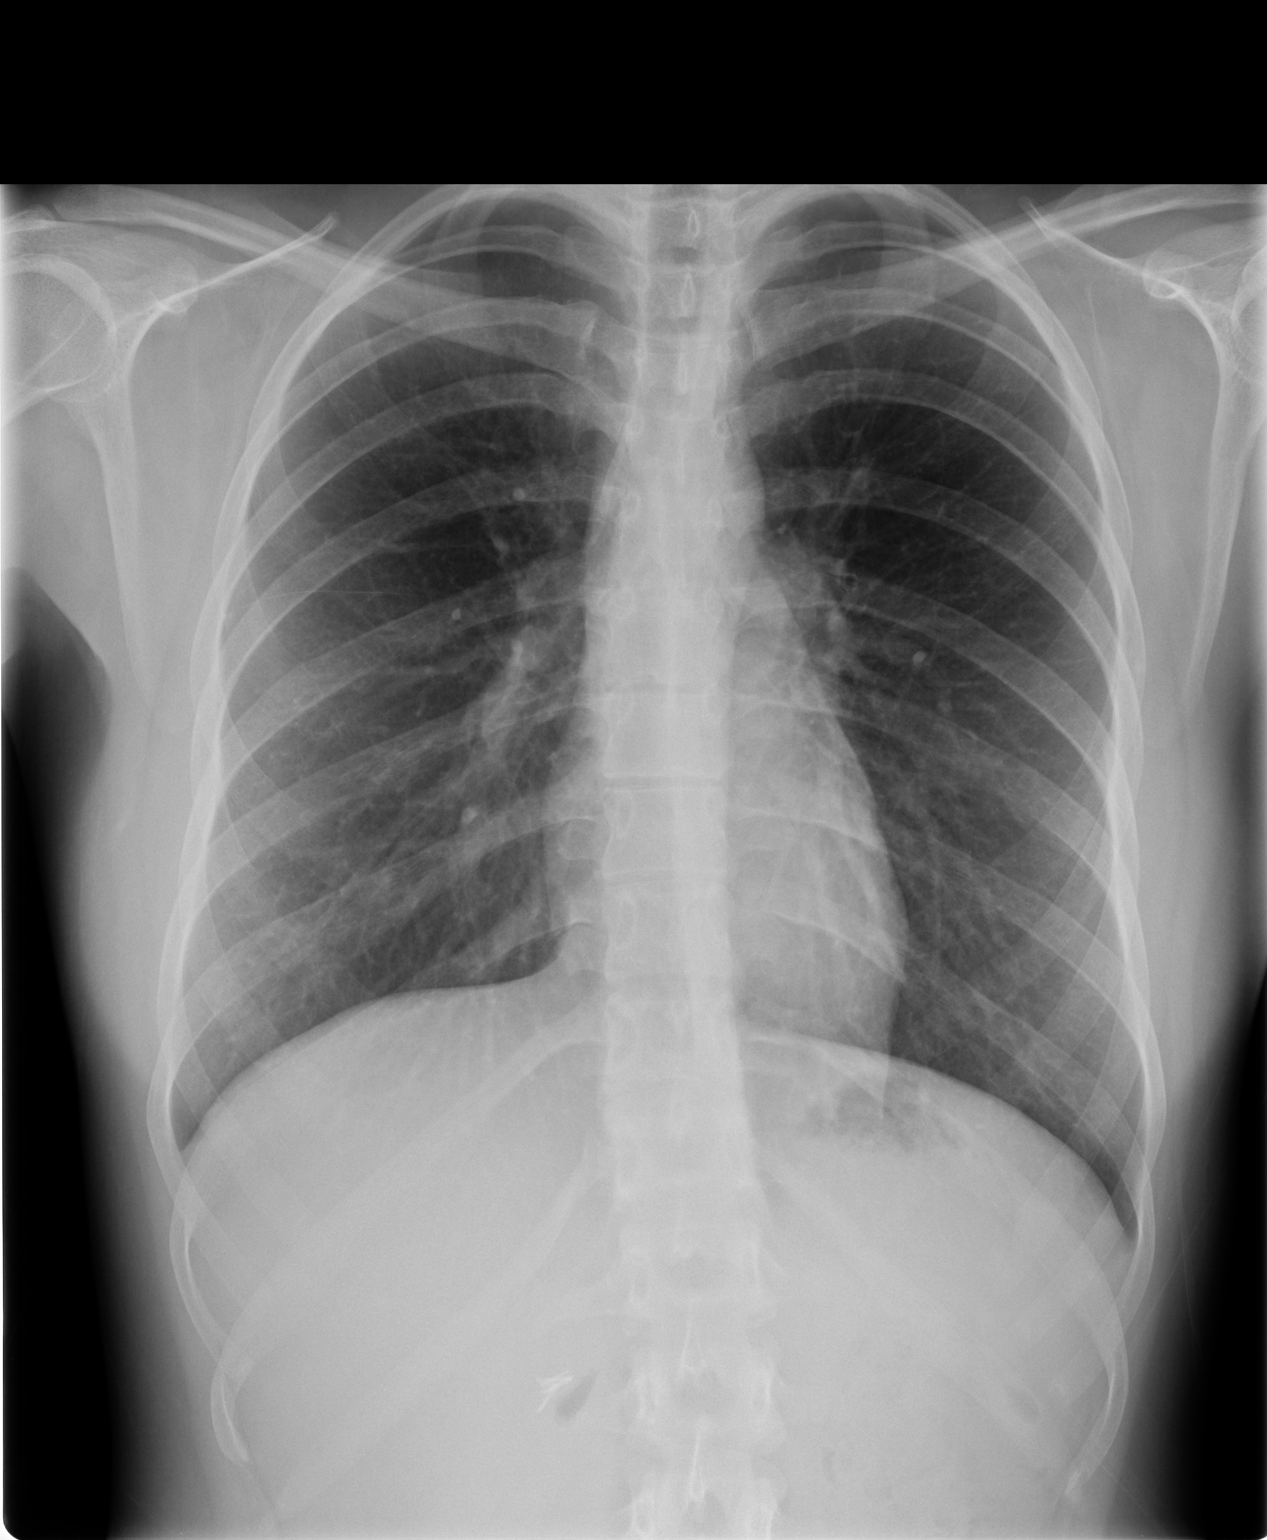

[view not recorded (2 of 2)]
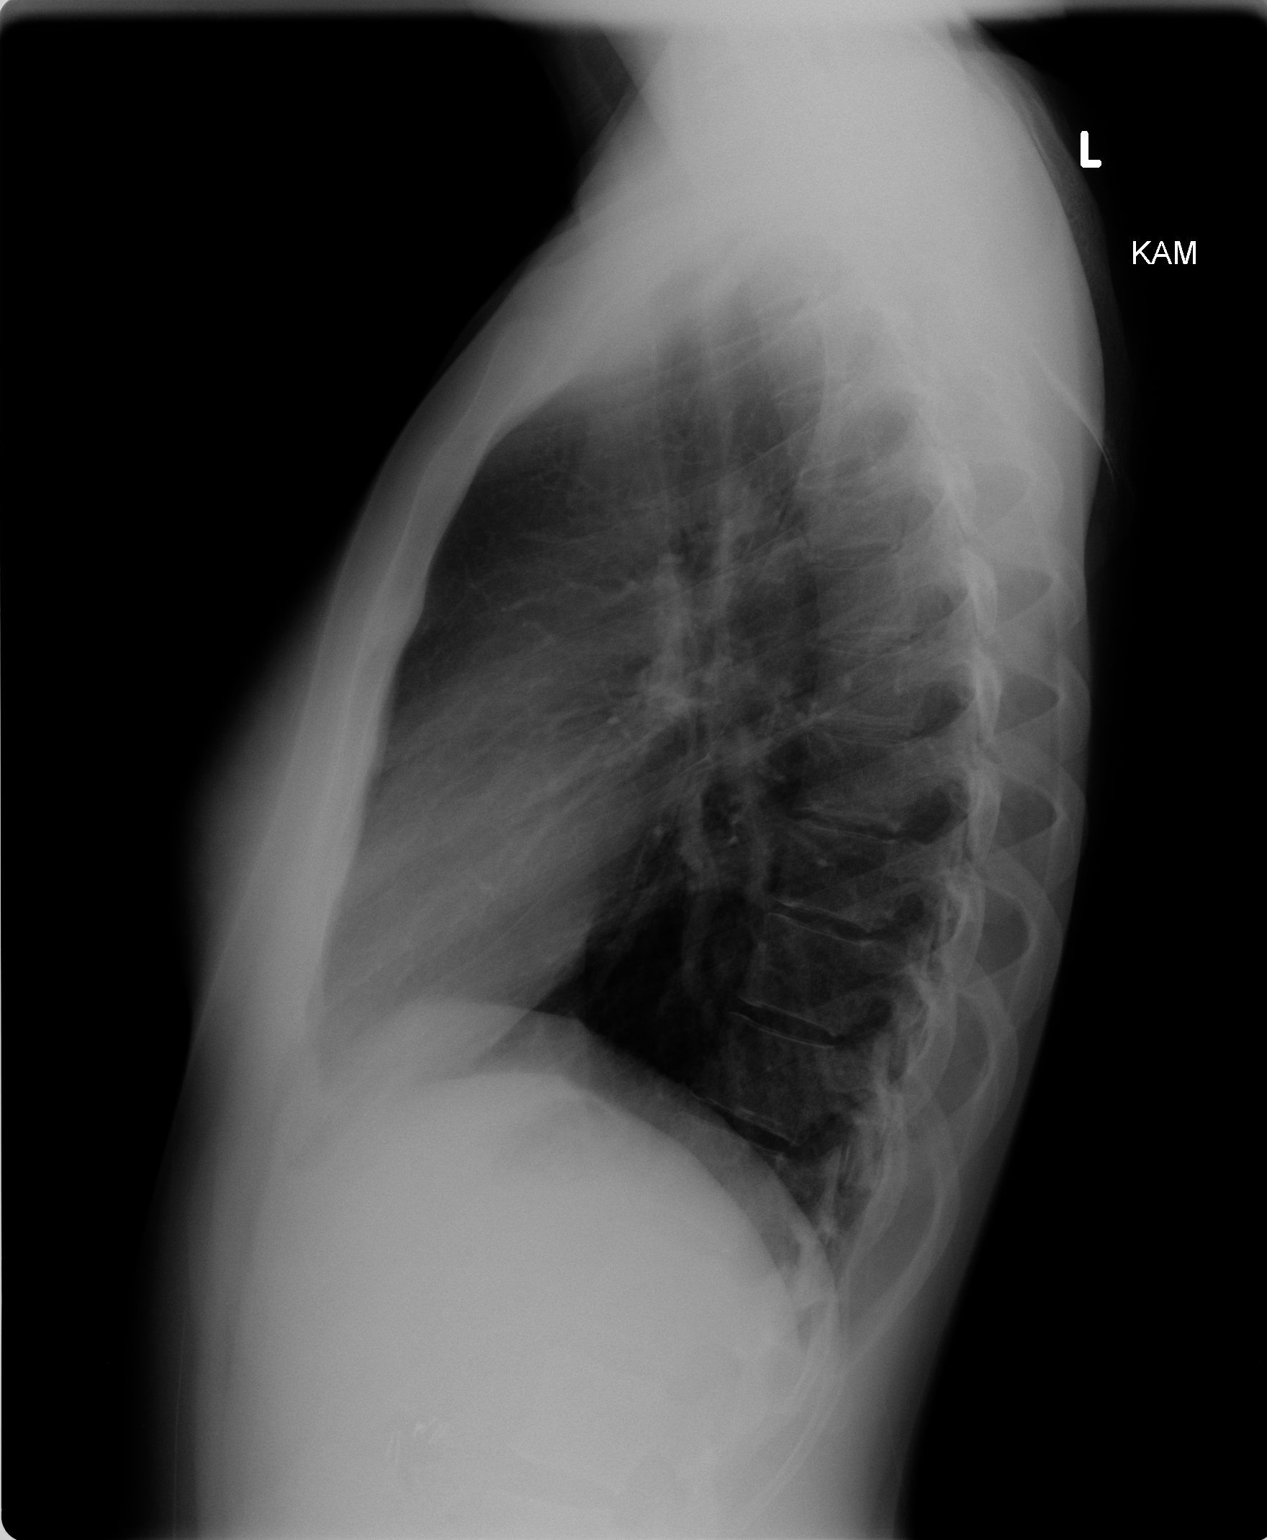

[2 of 2 positions shown; findings below may reference images not displayed]

FINDINGS: The heart size and mediastinal contours are within normal limits.
Both lungs are clear. The visualized skeletal structures are
unremarkable.
IMPRESSION: No active cardiopulmonary disease.

## 2015-05-17 ENCOUNTER — Other Ambulatory Visit: Payer: Self-pay | Admitting: Gastroenterology

## 2015-05-17 DIAGNOSIS — R1084 Generalized abdominal pain: Secondary | ICD-10-CM

## 2015-06-05 ENCOUNTER — Ambulatory Visit
Admission: RE | Admit: 2015-06-05 | Discharge: 2015-06-05 | Disposition: A | Discharge status: Home / Self Care | Payer: BLUE CROSS/BLUE SHIELD | Source: Ambulatory Visit | Attending: Gastroenterology | Admitting: Gastroenterology

## 2015-06-05 DIAGNOSIS — R1084 Generalized abdominal pain: Secondary | ICD-10-CM

## 2015-06-05 MED ORDER — IOPAMIDOL (ISOVUE-300) INJECTION 61%
100.0000 mL | Freq: Once | INTRAVENOUS | Status: AC | PRN
  Administered 2015-06-05: 100 mL via INTRAVENOUS

## 2015-07-13 ENCOUNTER — Telehealth: Payer: Self-pay | Admitting: Internal Medicine

## 2015-07-13 ENCOUNTER — Ambulatory Visit (INDEPENDENT_AMBULATORY_CARE_PROVIDER_SITE_OTHER): Payer: BLUE CROSS/BLUE SHIELD | Admitting: Internal Medicine

## 2015-07-13 ENCOUNTER — Encounter: Payer: Self-pay | Admitting: Internal Medicine

## 2015-07-13 VITALS — BP 120/80 | HR 90 | Ht 64.0 in | Wt 157.0 lb

## 2015-07-13 DIAGNOSIS — M25511 Pain in right shoulder: Secondary | ICD-10-CM | POA: Diagnosis not present

## 2015-07-13 DIAGNOSIS — Z8719 Personal history of other diseases of the digestive system: Secondary | ICD-10-CM | POA: Diagnosis not present

## 2015-07-13 DIAGNOSIS — M7501 Adhesive capsulitis of right shoulder: Secondary | ICD-10-CM | POA: Diagnosis not present

## 2015-07-13 MED ORDER — HYDROCODONE-ACETAMINOPHEN 10-325 MG PO TABS: 1.0000 | ORAL_TABLET | Freq: Three times a day (TID) | ORAL | Status: DC | PRN

## 2015-07-13 NOTE — Patient Instructions (Signed)
Appt with orthopedist re right shoulder. Take Norco 10 at bedtime for pain.

## 2015-07-13 NOTE — Telephone Encounter (Signed)
Appointment made with Dr. Justice Britain @ [facility] 07/18/15 @ 8:15 a.m.  Patient to arrive @ 7:45.   Dx:  Right Frozen Shoulder.  Decreased ROM x 2-3 months.  Patient notified.   Faxed documentation/notes/demographics to Dr Onnie Graham @ [phone].

## 2015-07-13 NOTE — Progress Notes (Signed)
   Subjective:    Patient ID: Robin Miller, female    DOB: 12-Jun-1990, 25 y.o.   MRN: [id number]  HPI 25 year old White Female not seen here since September 2015 with history of Crohn's disease currently stable with treatment. 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 Has been having pain in her right shoulder for the past 2-3 months. His been having decreased range of motion in right shoulder for 3 or 4 months. Initially thought it might be due to an old mattress so she bought a new one but right shoulder pain did not improve.  She cannot raise right upper extremity over her head at all. She has trouble raising it to 90. She says she injured her right shoulder at the age of 25 when she fell from a jungle gym. She was in a sling for about 2 months at that time. Was never told if she had a fracture.  It is not easy to drive because of decreased range of motion and pain in right shoulder. She's also felt some tingling in her right hand at times. Has no neck pain.  History of laparoscopic cholecystectomy 2014  Laparoscopic surgery for ileocecal valve stricture 2013 (ileocecectomy)    Review of Systems No recent injury that she is aware off. No falls. She's worried about paresthesias in her right hand and right arm.     Objective:   Physical Exam  Cannot raise right upper extremity over her head. Has difficulty bringing it up to 90. Deep tendon reflexes 1+ and symmetrical in the right upper extremity. Grip is weaker in the right hand than left. She is tender over her right scapular area.     Assessment & Plan:  Patient appears to have right frozen shoulder, however need to rule out internal derangement  Plan: Gave her Norco 10/325 (#30 tablets) one by mouth every 8 hours when necessary pain. Appointment see Dr. Justice Britain on March 22 at 8:15 AM.

## 2015-08-28 ENCOUNTER — Ambulatory Visit (INDEPENDENT_AMBULATORY_CARE_PROVIDER_SITE_OTHER): Payer: No Typology Code available for payment source

## 2015-08-28 ENCOUNTER — Ambulatory Visit (HOSPITAL_COMMUNITY)
Admission: EM | Admit: 2015-08-28 | Discharge: 2015-08-28 | Disposition: A | Discharge status: Home / Self Care | Payer: No Typology Code available for payment source | Attending: Emergency Medicine | Admitting: Emergency Medicine

## 2015-08-28 ENCOUNTER — Encounter (HOSPITAL_COMMUNITY): Payer: Self-pay | Admitting: Emergency Medicine

## 2015-08-28 DIAGNOSIS — M25532 Pain in left wrist: Secondary | ICD-10-CM

## 2015-08-28 DIAGNOSIS — R519 Headache, unspecified: Secondary | ICD-10-CM

## 2015-08-28 DIAGNOSIS — M79642 Pain in left hand: Secondary | ICD-10-CM | POA: Diagnosis not present

## 2015-08-28 DIAGNOSIS — R51 Headache: Secondary | ICD-10-CM | POA: Diagnosis not present

## 2015-08-28 MED ORDER — DICLOFENAC SODIUM 75 MG PO TBEC: 75.0000 mg | DELAYED_RELEASE_TABLET | Freq: Two times a day (BID) | ORAL | Status: DC

## 2015-08-28 MED ORDER — TRAMADOL HCL 50 MG PO TABS: ORAL_TABLET | ORAL | Status: DC

## 2015-08-28 MED ORDER — METAXALONE 800 MG PO TABS: 800.0000 mg | ORAL_TABLET | Freq: Three times a day (TID) | ORAL | Status: DC
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
## 2015-08-28 NOTE — Discharge Instructions (Signed)
Take the NSAID on a regular basis for the next 10 days. tramadol for severe pain only. many people find gentle stretching and deep tissue massage helpful. Try [facility] on [address]. They have very reasonable prices and take walk ins. Or you can go to  [facility]. Follow-up with your primary care physician in several days, go to the ER for the signs and symptoms we discussed.  People tend to feel worse over the next several days, but most people are back to normal in 1 week. A small number of people will have persistent pain for up to six weeks.  You may take up to 1 gram of tylenol 4 times a day. This with the NSAID is an extremely effective combination for pain.  Do not exceed 4 grams of tylenol per day from all sources.
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
## 2016-01-01 ENCOUNTER — Encounter: Payer: Self-pay | Admitting: Internal Medicine

## 2016-01-01 ENCOUNTER — Ambulatory Visit (INDEPENDENT_AMBULATORY_CARE_PROVIDER_SITE_OTHER): Payer: BLUE CROSS/BLUE SHIELD | Admitting: Internal Medicine

## 2016-01-01 VITALS — BP 102/74 | HR 94 | Temp 99.1°F | Ht 64.0 in | Wt 156.5 lb

## 2016-01-01 DIAGNOSIS — Z8719 Personal history of other diseases of the digestive system: Secondary | ICD-10-CM | POA: Diagnosis not present

## 2016-01-01 DIAGNOSIS — J209 Acute bronchitis, unspecified: Secondary | ICD-10-CM | POA: Diagnosis not present

## 2016-01-01 MED ORDER — HYDROCODONE-HOMATROPINE 5-1.5 MG/5ML PO SYRP: 5.0000 mL | ORAL_SOLUTION | Freq: Four times a day (QID) | ORAL | Status: DC | PRN

## 2016-01-01 MED ORDER — HYDROCODONE-HOMATROPINE 5-1.5 MG/5ML PO SYRP: 5.0000 mL | ORAL_SOLUTION | Freq: Four times a day (QID) | ORAL | 0 refills | Status: DC | PRN

## 2016-01-01 MED ORDER — LEVOFLOXACIN 500 MG PO TABS: 500.0000 mg | ORAL_TABLET | Freq: Every day | ORAL | 0 refills | Status: DC

## 2016-01-01 MED ORDER — ALBUTEROL SULFATE HFA 108 (90 BASE) MCG/ACT IN AERS: 2.0000 | INHALATION_SPRAY | Freq: Four times a day (QID) | RESPIRATORY_TRACT | 2 refills | Status: DC | PRN

## 2016-01-23 NOTE — Patient Instructions (Signed)
Levaquin 500 milligrams daily for 10 days. Hycodan 1 teaspoon by mouth every 8 hours when necessary cough. Rest and drink plenty of fluids. Advise staying out of work one to 2 days.

## 2016-01-23 NOTE — Progress Notes (Signed)
   Subjective:    Patient ID: Robin Miller, female    DOB: 11/09/1990, 25 y.o.   MRN: [id number]  HPI 25 year old Female with history of Crohn's disease in today with respiratory infection symptoms. No documented fever. Patient now working at [employer]. She is followed by Dr. Fuller Plan, gastroenterologist. Has cough congestion malaise and fatigue. Symptoms present for 4 days. Over-the-counter cold medications have not helped cough.  Review of Systems as above     Objective:   Physical Exam Skin warm and dry. Pharynx slightly injected. TMs are slightly full bilaterally. Neck is supple without significant adenopathy. Chest clear to auscultation without rales or wheezing. Coughing a lot in the office.       Assessment & Plan:  Acute bronchitis  Bilateral serous otitis media  Crohn's disease  Plan: Levaquin 500 milligrams daily for 10 days. Hycodan 1 teaspoon by mouth every 8 hours when necessary cough. Rest and drink plenty of fluids. Advise staying out of work one to 2 days.

## 2016-06-30 ENCOUNTER — Telehealth: Payer: Self-pay

## 2016-06-30 NOTE — Telephone Encounter (Signed)
Patient states she called last week for an appt for "itchy patches" she doesn't see a rash and now is bruising, she was told to see derm but she can't get an appt until June she would like for you to look at her skin. Ok to schedule? She said it started on Monday and then she was ok for a few for days and then on Friday it got really bad.

## 2016-06-30 NOTE — Telephone Encounter (Signed)
Robin Miller got appointment for this Friday, 3/9 @ 9:30 a.m. With Dr. Allyn Kenner, Dermatologist.  [phone])  [facility] contacted patient, provided appointment information, location and phone #.  Patient was also given the name of anti-itch cream that she could buy OTC called Sarna to use between now and then.

## 2016-06-30 NOTE — Telephone Encounter (Signed)
Please call and see about Dermatologist appt

## 2016-08-06 ENCOUNTER — Other Ambulatory Visit: Payer: Self-pay | Admitting: Obstetrics and Gynecology

## 2017-02-11 ENCOUNTER — Encounter (HOSPITAL_COMMUNITY): Payer: Self-pay | Admitting: *Deleted

## 2017-02-11 ENCOUNTER — Inpatient Hospital Stay (HOSPITAL_COMMUNITY)
Admission: AD | Admit: 2017-02-11 | Discharge: 2017-02-11 | Disposition: A | Discharge status: Home / Self Care | Payer: BLUE CROSS/BLUE SHIELD | Source: Ambulatory Visit | Attending: Obstetrics & Gynecology | Admitting: Obstetrics & Gynecology

## 2017-02-11 DIAGNOSIS — Z0371 Encounter for suspected problem with amniotic cavity and membrane ruled out: Secondary | ICD-10-CM

## 2017-02-11 DIAGNOSIS — O4703 False labor before 37 completed weeks of gestation, third trimester: Secondary | ICD-10-CM | POA: Insufficient documentation

## 2017-02-11 DIAGNOSIS — Z3A35 35 weeks gestation of pregnancy: Secondary | ICD-10-CM

## 2017-02-11 DIAGNOSIS — O479 False labor, unspecified: Secondary | ICD-10-CM

## 2017-02-11 LAB — URINALYSIS, ROUTINE W REFLEX MICROSCOPIC
BILIRUBIN URINE: NEGATIVE
Glucose, UA: NEGATIVE mg/dL
Hgb urine dipstick: NEGATIVE
Ketones, ur: 5 mg/dL — AB
NITRITE: NEGATIVE
PH: 6 (ref 5.0–8.0)
Protein, ur: NEGATIVE mg/dL
SPECIFIC GRAVITY, URINE: 1.006 (ref 1.005–1.030)

## 2017-02-11 LAB — AMNISURE RUPTURE OF MEMBRANE (ROM) NOT AT ARMC: Amnisure ROM: NEGATIVE

## 2017-02-11 NOTE — MAU Provider Note (Signed)
History     CSN: [id number]  Arrival date and time: 02/11/17 1405   None     Chief Complaint  Patient presents with  . Contractions  . questionable leaking   HPI    Robin Miller is a 26 y.o. female G1P0 @ [redacted]w[redacted]d here in MAU with contractions that started today at 1230 while she was at work. At times the contractions are painful and other times it feels like a tightening feeling in her abdomen. At times she feels pain in her lower back; both sides. No bleeding or leaking of water. States at times she is feeling some trickling of water; states she is not sure if it is urine or discharge. She currently rates her contraction pain at 3/10-4/10. + fetal movement. Has not taken any pain medication.   OB History    Gravida Para Term Preterm AB Living   1             SAB TAB Ectopic Multiple Live Births                  Past Medical History:  Diagnosis Date  . Anemia   . Cancer ([place])    basal cell back and face  . 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 Depression   . Erosive esophagitis    [place] Class B  . GERD (gastroesophageal reflux disease)   . Nausea vomiting and diarrhea   . Vasovagal syncope     Past Surgical History:  Procedure Laterality Date  . CHOLECYSTECTOMY N/A 03/09/2013   Procedure: LAPAROSCOPIC CHOLECYSTECTOMY;  Surgeon: Gayland Curry, MD;  Location: [facility];  Service: General;  Laterality: N/A;  . COLONOSCOPY N/A 03/07/2013   Procedure: COLONOSCOPY;  Surgeon: Gatha Mayer, MD;  [facility];  Service: Endoscopy;  Laterality: N/A;  . COLONOSCOPY    . CYSTOSCOPY WITH RETROGRADE PYELOGRAM, URETEROSCOPY AND STENT PLACEMENT  05/27/2012   Procedure: CYSTOSCOPY WITH RETROGRADE PYELOGRAM, URETEROSCOPY AND STENT PLACEMENT;  Surgeon: Dutch Gray, MD;  [facility];  Service: Urology;  Laterality: Right;  . ESOPHAGOGASTRODUODENOSCOPY N/A 03/04/2013   Procedure: ESOPHAGOGASTRODUODENOSCOPY (EGD);  Surgeon: Jerene Bears, MD;  Location: [facility];  Service: Endoscopy;  Laterality: N/A;  . HOLMIUM LASER APPLICATION  12/24/9369   Procedure: HOLMIUM LASER APPLICATION;  Surgeon: Dutch Gray, MD;  [facility];  Service: Urology;  Laterality: Right;     . ileocetomy  03/16/2012  . LAPAROSCOPIC ILEOCECECTOMY  03/16/2012   Procedure: LAPAROSCOPIC ILEOCECECTOMY;  Surgeon: Adin Hector, MD;  Location: [facility];  Service: General;  Laterality: N/A;  lap assisted ileocectomy  . SKIN CANCER EXCISION    . TONSILLECTOMY AND ADENOIDECTOMY    . UPPER GASTROINTESTINAL ENDOSCOPY      Family History  Problem Relation Age of Onset  . Stroke Mother   . Breast cancer Mother   . Crohn's disease Father   . Breast cancer Paternal Aunt   . Colitis Maternal Aunt   . Colon cancer Paternal Grandmother        ? cancer, part of colon removed  . Stroke Paternal Grandmother   . Esophageal cancer Neg Hx   . Pancreatic cancer Neg Hx   . Rectal cancer Neg Hx   . Stomach cancer Neg Hx     Social History  Substance Use Topics  . Smoking status: Never Smoker  . Smokeless tobacco: Never Used  .  Alcohol use No    Allergies:  Allergies  Allergen Reactions  . Prednisone     Blurred vision, ringing in ears, insomnia, shaky, body convulsions,nausea    Prescriptions Prior to Admission  Medication Sig Dispense Refill Last Dose  . mesalamine (PENTASA) 500 MG CR capsule Take 500 mg by mouth 4 (four) times daily.   02/10/2017 at Unknown time  . acetaminophen (TYLENOL) 325 MG tablet Take 650 mg by mouth as needed.   More than a month at Unknown time  . albuterol (PROVENTIL HFA;VENTOLIN HFA) 108 (90 Base) MCG/ACT inhaler Inhale 2 puffs into the lungs every 6 (six) hours as needed for wheezing or shortness of breath. 1 Inhaler 2 More than a month at Unknown time  . DELZICOL 400 MG CPDR DR capsule TAKE 3 CAPS TWO TIMES DAILY  6 Taking  . esomeprazole (NEXIUM) 20 MG  capsule Take 20 mg by mouth daily as needed.    More than a month at Unknown time  . etonogestrel-ethinyl estradiol (NUVARING) 0.12-0.015 MG/24HR vaginal ring Place 1 each vaginally every 28 (twenty-eight) days. Insert vaginally and leave in place for 3 consecutive weeks, then remove for 1 week.    More than a month at Unknown time  . HYDROcodone-homatropine (HYCODAN) 5-1.5 MG/5ML syrup Take 5 mLs by mouth every 6 (six) hours as needed for cough. 120 mL 0 More than a month at Unknown time  . levofloxacin (LEVAQUIN) 500 MG tablet Take 1 tablet (500 mg total) by mouth daily. 10 tablet 0 More than a month at Unknown time  . Multiple Vitamin (MULTIVITAMIN WITH MINERALS) TABS tablet Take 1 tablet by mouth daily.   More than a month at Unknown time  . Probiotic Product (PROBIOTIC PO) Take by mouth.   More than a month at Unknown time   Results for orders placed or performed during the hospital encounter of 02/11/17 (from the past 48 hour(s))  Urinalysis, Routine w reflex microscopic     Status: Abnormal   Collection Time: 02/11/17  2:24 PM  Result Value Ref Range   Color, Urine YELLOW YELLOW   APPearance HAZY (A) CLEAR   Specific Gravity, Urine 1.006 1.005 - 1.030   pH 6.0 5.0 - 8.0   Glucose, UA NEGATIVE NEGATIVE mg/dL   Hgb urine dipstick NEGATIVE NEGATIVE   Bilirubin Urine NEGATIVE NEGATIVE   Ketones, ur 5 (A) NEGATIVE mg/dL   Protein, ur NEGATIVE NEGATIVE mg/dL   Nitrite NEGATIVE NEGATIVE   Leukocytes, UA MODERATE (A) NEGATIVE   RBC / HPF 0-5 0 - 5 RBC/hpf   WBC, UA 6-30 0 - 5 WBC/hpf   Bacteria, UA RARE (A) NONE SEEN   Squamous Epithelial / LPF 6-30 (A) NONE SEEN  Amnisure rupture of membrane (rom)not at [facility]     Status: None   Collection Time: 02/11/17  3:08 PM  Result Value Ref Range   Amnisure ROM NEGATIVE     Review of Systems  Constitutional: Negative for fever.  Gastrointestinal: Negative for abdominal pain.  Genitourinary: Negative for dysuria, vaginal bleeding and vaginal  discharge.   Physical Exam   Blood pressure 116/78, pulse 87, temperature 97.9 F (36.6 C), temperature source Oral, resp. rate 16, height 5\' 3"  (1.6 m), weight 89.8 kg (198 lb), SpO2 99 %.  Physical Exam  Constitutional: She is oriented to person, place, and time. She appears well-developed and well-nourished. No distress.  HENT:  Head: Normocephalic.  Eyes: Pupils are equal, round, and reactive to light.  GI: Soft.  Genitourinary:  Genitourinary Comments: Dilation: Closed Effacement (%): 50 Exam by:: J. Rasch NP  Musculoskeletal: Normal range of motion.  Neurological: She is alert and oriented to person, place, and time.  Skin: Skin is warm. She is not diaphoretic.  Psychiatric: Her behavior is normal.   Fetal Tracing: Baseline: 125 bpm Variability: Moderate  Accelerations: 15x15 Decelerations: None Toco: Occasional   MAU Course  Procedures  None  MDM  Push oral fluids Amnisure negative  Discussed with Dr. Alwyn Pea. Ada for DC home.  Urine culture added.  Assessment and Plan   A:  1. Braxton Hick's contraction   2. Encounter for suspected premature rupture of amniotic membranes, with rupture of membranes not found     P:  Discharge home with strict return precautions Preterm labor precautions  Return to MAU if symptoms worsen  Follow up with OB as scheduled   Noni Saupe I, NP 02/11/2017 8:43 PM

## 2017-02-11 NOTE — MAU Note (Signed)
Just been having some contractions for the last few hours, ranging from 6-82min.  No bleeding, ? Leaking vs pee

## 2017-02-11 NOTE — Discharge Instructions (Signed)

## 2017-02-11 NOTE — MAU Note (Signed)
Urine in lab 

## 2017-02-13 LAB — CULTURE, OB URINE: SPECIAL REQUESTS: NORMAL

## 2017-03-15 ENCOUNTER — Encounter (HOSPITAL_COMMUNITY): Payer: Self-pay | Admitting: *Deleted

## 2017-03-15 ENCOUNTER — Encounter (HOSPITAL_COMMUNITY): Payer: Self-pay | Admitting: Anesthesiology

## 2017-03-15 ENCOUNTER — Inpatient Hospital Stay (HOSPITAL_COMMUNITY): Payer: BLUE CROSS/BLUE SHIELD | Admitting: Anesthesiology

## 2017-03-15 ENCOUNTER — Inpatient Hospital Stay (HOSPITAL_COMMUNITY)
Admission: AD | Admit: 2017-03-15 | Discharge: 2017-03-20 | DRG: 786 | Disposition: A | Discharge status: Home / Self Care | Payer: BLUE CROSS/BLUE SHIELD | Source: Ambulatory Visit | Attending: Obstetrics and Gynecology | Admitting: Obstetrics and Gynecology

## 2017-03-15 ENCOUNTER — Other Ambulatory Visit: Payer: Self-pay

## 2017-03-15 ENCOUNTER — Encounter (HOSPITAL_COMMUNITY)
Admission: AD | Disposition: A | Discharge status: Home / Self Care | Payer: Self-pay | Source: Ambulatory Visit | Attending: Obstetrics and Gynecology

## 2017-03-15 DIAGNOSIS — Z3A40 40 weeks gestation of pregnancy: Secondary | ICD-10-CM | POA: Diagnosis not present

## 2017-03-15 DIAGNOSIS — K5090 Crohn's disease, unspecified, without complications: Secondary | ICD-10-CM | POA: Diagnosis present

## 2017-03-15 DIAGNOSIS — O252 Malnutrition in childbirth: Secondary | ICD-10-CM | POA: Diagnosis present

## 2017-03-15 DIAGNOSIS — O9962 Diseases of the digestive system complicating childbirth: Secondary | ICD-10-CM | POA: Diagnosis present

## 2017-03-15 DIAGNOSIS — Z3490 Encounter for supervision of normal pregnancy, unspecified, unspecified trimester: Secondary | ICD-10-CM

## 2017-03-15 DIAGNOSIS — O4290 Premature rupture of membranes, unspecified as to length of time between rupture and onset of labor, unspecified weeks of gestation: Secondary | ICD-10-CM | POA: Diagnosis present

## 2017-03-15 DIAGNOSIS — O4292 Full-term premature rupture of membranes, unspecified as to length of time between rupture and onset of labor: Secondary | ICD-10-CM | POA: Diagnosis present

## 2017-03-15 DIAGNOSIS — O411230 Chorioamnionitis, third trimester, not applicable or unspecified: Secondary | ICD-10-CM | POA: Diagnosis present

## 2017-03-15 DIAGNOSIS — O34219 Maternal care for unspecified type scar from previous cesarean delivery: Secondary | ICD-10-CM

## 2017-03-15 LAB — COMPREHENSIVE METABOLIC PANEL
ALK PHOS: 155 U/L — AB (ref 38–126)
ALT: 8 U/L — AB (ref 14–54)
ANION GAP: 10 (ref 5–15)
AST: 17 U/L (ref 15–41)
Albumin: 2.8 g/dL — ABNORMAL LOW (ref 3.5–5.0)
BILIRUBIN TOTAL: 0.2 mg/dL — AB (ref 0.3–1.2)
BUN: <5 mg/dL — ABNORMAL LOW (ref 6–20)
CALCIUM: 8.1 mg/dL — AB (ref 8.9–10.3)
CO2: 22 mmol/L (ref 22–32)
CREATININE: 0.57 mg/dL (ref 0.44–1.00)
Chloride: 104 mmol/L (ref 101–111)
GFR calc non Af Amer: >60 mL/min (ref >60)
Glucose, Bld: 108 mg/dL — ABNORMAL HIGH (ref 65–99)
Potassium: 3.7 mmol/L (ref 3.5–5.1)
SODIUM: 136 mmol/L (ref 135–145)
TOTAL PROTEIN: 6.1 g/dL — AB (ref 6.5–8.1)

## 2017-03-15 LAB — CBC
HEMATOCRIT: 36.2 % (ref 36.0–46.0)
HEMOGLOBIN: 11.6 g/dL — AB (ref 12.0–15.0)
MCH: 28.7 pg (ref 26.0–34.0)
MCHC: 32 g/dL (ref 30.0–36.0)
MCV: 89.6 fL (ref 78.0–100.0)
Platelets: 241 10*3/uL (ref 150–400)
RBC: 4.04 MIL/uL (ref 3.87–5.11)
RDW: 15 % (ref 11.5–15.5)
WBC: 8.6 10*3/uL (ref 4.0–10.5)

## 2017-03-15 LAB — RPR: RPR: NONREACTIVE

## 2017-03-15 LAB — PROTEIN / CREATININE RATIO, URINE
Creatinine, Urine: 62 mg/dL
Protein Creatinine Ratio: 0.23 mg/mg{Cre} — ABNORMAL HIGH (ref 0.00–0.15)
TOTAL PROTEIN, URINE: 14 mg/dL

## 2017-03-15 LAB — HIV ANTIBODY (ROUTINE TESTING W REFLEX): HIV Screen 4th Generation wRfx: NONREACTIVE

## 2017-03-15 LAB — POCT FERN TEST: POCT Fern Test: POSITIVE

## 2017-03-15 LAB — TYPE AND SCREEN
ABO/RH(D): O POS
ANTIBODY SCREEN: NEGATIVE

## 2017-03-15 LAB — ABO/RH: ABO/RH(D): O POS

## 2017-03-15 SURGERY — Surgical Case
Anesthesia: Epidural

## 2017-03-15 MED ORDER — LACTATED RINGERS IV SOLN
500.0000 mL | INTRAVENOUS | Status: DC | PRN
  Administered 2017-03-15: 500 mL via INTRAVENOUS

## 2017-03-15 MED ORDER — LIDOCAINE HCL (PF) 1 % IJ SOLN
INTRAMUSCULAR | Status: DC | PRN
  Administered 2017-03-15: 5 mL via EPIDURAL
  Administered 2017-03-15: 7 mL via EPIDURAL

## 2017-03-15 MED ORDER — ACETAMINOPHEN 325 MG PO TABS
650.0000 mg | ORAL_TABLET | ORAL | Status: DC | PRN
  Administered 2017-03-15: 650 mg via ORAL
  Filled 2017-03-15: qty 2

## 2017-03-15 MED ORDER — ONDANSETRON HCL 4 MG/2ML IJ SOLN
4.0000 mg | Freq: Four times a day (QID) | INTRAMUSCULAR | Status: DC | PRN
Start: 2017-03-15 — End: 2017-03-16
  Administered 2017-03-15 – 2017-03-16 (×2): 4 mg via INTRAVENOUS
  Filled 2017-03-15: qty 2

## 2017-03-15 MED ORDER — LIDOCAINE HCL (PF) 1 % IJ SOLN: 30.0000 mL | INTRAMUSCULAR | Status: DC | PRN

## 2017-03-15 MED ORDER — OXYCODONE-ACETAMINOPHEN 5-325 MG PO TABS: 2.0000 | ORAL_TABLET | ORAL | Status: DC | PRN

## 2017-03-15 MED ORDER — SCOPOLAMINE 1 MG/3DAYS TD PT72
MEDICATED_PATCH | TRANSDERMAL | Status: AC
  Filled 2017-03-15: qty 1

## 2017-03-15 MED ORDER — LACTATED RINGERS IV SOLN
INTRAVENOUS | Status: DC | PRN
  Administered 2017-03-15 – 2017-03-16 (×3): via INTRAVENOUS

## 2017-03-15 MED ORDER — LACTATED RINGERS IV SOLN
INTRAVENOUS | Status: DC
  Administered 2017-03-15: 125 mL/h via INTRAVENOUS
  Administered 2017-03-15 (×2): via INTRAVENOUS

## 2017-03-15 MED ORDER — OXYTOCIN 40 UNITS IN LACTATED RINGERS INFUSION - SIMPLE MED
1.0000 m[IU]/min | INTRAVENOUS | Status: DC
  Administered 2017-03-15: 2 m[IU]/min via INTRAVENOUS
  Filled 2017-03-15: qty 1000

## 2017-03-15 MED ORDER — OXYTOCIN 10 UNIT/ML IJ SOLN
INTRAMUSCULAR | Status: AC
  Filled 2017-03-15: qty 4

## 2017-03-15 MED ORDER — SODIUM CHLORIDE 0.9 % IV SOLN
2.0000 g | Freq: Once | INTRAVENOUS | Status: AC
  Administered 2017-03-15: 2 g via INTRAVENOUS
  Filled 2017-03-15: qty 2000

## 2017-03-15 MED ORDER — DIPHENHYDRAMINE HCL 50 MG/ML IJ SOLN: 12.5000 mg | INTRAMUSCULAR | Status: DC | PRN

## 2017-03-15 MED ORDER — FENTANYL 2.5 MCG/ML BUPIVACAINE 1/10 % EPIDURAL INFUSION (WH - ANES)
14.0000 mL/h | INTRAMUSCULAR | Status: DC | PRN
  Administered 2017-03-15 (×2): 14 mL/h via EPIDURAL
  Filled 2017-03-15 (×2): qty 100

## 2017-03-15 MED ORDER — LACTATED RINGERS IV SOLN: 500.0000 mL | Freq: Once | INTRAVENOUS | Status: DC

## 2017-03-15 MED ORDER — LIDOCAINE-EPINEPHRINE (PF) 2 %-1:200000 IJ SOLN
INTRAMUSCULAR | Status: AC
  Filled 2017-03-15: qty 20

## 2017-03-15 MED ORDER — SCOPOLAMINE 1 MG/3DAYS TD PT72
MEDICATED_PATCH | TRANSDERMAL | Status: DC | PRN
  Administered 2017-03-15: 1 via TRANSDERMAL

## 2017-03-15 MED ORDER — SODIUM BICARBONATE 8.4 % IV SOLN
INTRAVENOUS | Status: DC | PRN
  Administered 2017-03-15 (×2): 5 mL via EPIDURAL

## 2017-03-15 MED ORDER — PHENYLEPHRINE 40 MCG/ML (10ML) SYRINGE FOR IV PUSH (FOR BLOOD PRESSURE SUPPORT)
80.0000 ug | PREFILLED_SYRINGE | INTRAVENOUS | Status: DC | PRN
  Filled 2017-03-15: qty 10

## 2017-03-15 MED ORDER — SOD CITRATE-CITRIC ACID 500-334 MG/5ML PO SOLN
30.0000 mL | ORAL | Status: DC | PRN
Start: 2017-03-15 — End: 2017-03-16
  Administered 2017-03-15: 30 mL via ORAL
  Filled 2017-03-15: qty 15

## 2017-03-15 MED ORDER — SODIUM BICARBONATE 8.4 % IV SOLN
INTRAVENOUS | Status: AC
  Filled 2017-03-15: qty 50

## 2017-03-15 MED ORDER — TERBUTALINE SULFATE 1 MG/ML IJ SOLN: 0.2500 mg | Freq: Once | INTRAMUSCULAR | Status: DC | PRN

## 2017-03-15 MED ORDER — OXYTOCIN 40 UNITS IN LACTATED RINGERS INFUSION - SIMPLE MED: 2.5000 [IU]/h | INTRAVENOUS | Status: DC

## 2017-03-15 MED ORDER — PHENYLEPHRINE 40 MCG/ML (10ML) SYRINGE FOR IV PUSH (FOR BLOOD PRESSURE SUPPORT): 80.0000 ug | PREFILLED_SYRINGE | INTRAVENOUS | Status: DC | PRN

## 2017-03-15 MED ORDER — MORPHINE SULFATE (PF) 0.5 MG/ML IJ SOLN
INTRAMUSCULAR | Status: AC
  Filled 2017-03-15: qty 10

## 2017-03-15 MED ORDER — EPHEDRINE 5 MG/ML INJ: 10.0000 mg | INTRAVENOUS | Status: DC | PRN

## 2017-03-15 MED ORDER — ONDANSETRON HCL 4 MG/2ML IJ SOLN
INTRAMUSCULAR | Status: AC
  Filled 2017-03-15: qty 2

## 2017-03-15 MED ORDER — BUTORPHANOL TARTRATE 1 MG/ML IJ SOLN
1.0000 mg | INTRAMUSCULAR | Status: DC | PRN
  Administered 2017-03-15 (×3): 1 mg via INTRAVENOUS
  Filled 2017-03-15 (×3): qty 1

## 2017-03-15 MED ORDER — OXYCODONE-ACETAMINOPHEN 5-325 MG PO TABS
1.0000 | ORAL_TABLET | ORAL | Status: DC | PRN
Start: 2017-03-15 — End: 2017-03-16

## 2017-03-15 MED ORDER — OXYTOCIN BOLUS FROM INFUSION: 500.0000 mL | Freq: Once | INTRAVENOUS | Status: DC

## 2017-03-15 SURGICAL SUPPLY — 25 items
BENZOIN TINCTURE PRP APPL 2/3 (GAUZE/BANDAGES/DRESSINGS) ×2 IMPLANT
CHLORAPREP W/TINT 26ML (MISCELLANEOUS) ×2 IMPLANT
CLAMP CORD UMBIL (MISCELLANEOUS) ×2 IMPLANT
CLOTH BEACON ORANGE TIMEOUT ST (SAFETY) ×2 IMPLANT
CONTAINER PREFILL 10% NBF 15ML (MISCELLANEOUS) IMPLANT
DRSG OPSITE POSTOP 4X10 (GAUZE/BANDAGES/DRESSINGS) ×2 IMPLANT
ELECT REM PT RETURN 9FT ADLT (ELECTROSURGICAL) ×2
ELECTRODE REM PT RTRN 9FT ADLT (ELECTROSURGICAL) ×1 IMPLANT
EXTRACTOR VACUUM M CUP 4 TUBE (SUCTIONS) IMPLANT
GLOVE BIOGEL PI IND STRL 7.0 (GLOVE) ×1 IMPLANT
GLOVE BIOGEL PI INDICATOR 7.0 (GLOVE) ×1
GLOVE ECLIPSE 7.0 STRL STRAW (GLOVE) ×4 IMPLANT
GOWN STRL REUS W/TWL LRG LVL3 (GOWN DISPOSABLE) ×4 IMPLANT
KIT ABG SYR 3ML LUER SLIP (SYRINGE) IMPLANT
NEEDLE HYPO 25X5/8 SAFETYGLIDE (NEEDLE) IMPLANT
NS IRRIG 1000ML POUR BTL (IV SOLUTION) ×2 IMPLANT
PACK C SECTION WH (CUSTOM PROCEDURE TRAY) ×2 IMPLANT
PAD OB MATERNITY 4.3X12.25 (PERSONAL CARE ITEMS) ×2 IMPLANT
STRIP CLOSURE SKIN 1/2X4 (GAUZE/BANDAGES/DRESSINGS) ×2 IMPLANT
SUT MNCRL 0 VIOLET CTX 36 (SUTURE) ×3 IMPLANT
SUT MON AB 2-0 CT1 27 (SUTURE) ×4 IMPLANT
SUT MONOCRYL 0 CTX 36 (SUTURE) ×3
SUT PLAIN 0 NONE (SUTURE) IMPLANT
TOWEL OR 17X24 6PK STRL BLUE (TOWEL DISPOSABLE) ×2 IMPLANT
TRAY FOLEY BAG SILVER LF 14FR (SET/KITS/TRAYS/PACK) IMPLANT

## 2017-03-15 NOTE — MAU Note (Signed)
IV attempt x1 in R hand. IV in but unable to thread catheter. No other good iv options seen. WIll move to [facility] and have CRNA evaluate

## 2017-03-15 NOTE — Progress Notes (Signed)
Pt has had a protracted labor curve all day. She has developed a temp and an increase in baseline on FHTs. She was txd with Ampicillin and tylenol. Despite continuing to increase the pitocin the MDUs continue to decline. There has been no change in her cx for 3 hours. Will proceed to OR for C/s. D/W pt and spouse.

## 2017-03-15 NOTE — H&P (Signed)
Pt  is an 26 y.o. G1P0 40w0 white female. Who presents to the ER c/o SROM. She was admitted with + fern PNC was uncomplicated, She has a h.o. Crohn's. Pt had nl FTS/ NL OGTT/ neg GBS   Past Medical History:  Diagnosis Date  . Anemia   . Cancer ([place])    basal cell back and face  . 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 Crohn's ileocolitis ([place]) 11/04/2011  . Depression   . Erosive esophagitis    LA Class B  . GERD (gastroesophageal reflux disease)   . Nausea vomiting and diarrhea   . Vasovagal syncope     Past Surgical History:  Procedure Laterality Date  . COLONOSCOPY    . COLONOSCOPY N/A 03/07/2013   Performed by Gatha Mayer, MD at [place]  . CYSTOSCOPY WITH RETROGRADE PYELOGRAM, URETEROSCOPY AND STENT PLACEMENT Right 05/27/2012   Performed by Dutch Gray, MD at [facility] ORS  . ESOPHAGOGASTRODUODENOSCOPY (EGD) N/A 03/04/2013   Performed by Jerene Bears, MD at [place]  . HOLMIUM LASER APPLICATION Right 12/20/2351   Performed by Dutch Gray, MD at [facility] ORS  . ileocetomy  03/16/2012  . LAPAROSCOPIC CHOLECYSTECTOMY N/A 03/09/2013   Performed by Greer Pickerel, MD at [facility] ORS  . LAPAROSCOPIC ILEOCECECTOMY N/A 03/16/2012   Performed by Adin Hector., MD at [facility] OR  . SKIN CANCER EXCISION    . TONSILLECTOMY AND ADENOIDECTOMY    . UPPER GASTROINTESTINAL ENDOSCOPY      Family History  Problem Relation Age of Onset  . Stroke Mother   . Breast cancer Mother   . Crohn's disease Father   . Breast cancer Paternal Aunt   . Colitis Maternal Aunt   . Colon cancer Paternal Grandmother        ? cancer, part of colon removed  . Stroke Paternal Grandmother   . Esophageal cancer Neg Hx   . Pancreatic cancer Neg Hx   . Rectal cancer Neg Hx   . Stomach cancer Neg Hx    Social History:  reports that  has never smoked. she has never used smokeless tobacco. She reports that she does not drink alcohol or use drugs.  Allergies:   Allergies  Allergen Reactions  . Prednisone     Blurred vision, ringing in ears, insomnia, shaky, body convulsions,nausea    Medications Prior to Admission  Medication Sig Dispense Refill  . mesalamine (PENTASA) 500 MG CR capsule Take 1,500 mg 2 (two) times daily by mouth.     . Multiple Vitamin (MULTIVITAMIN WITH MINERALS) TABS tablet Take 1 tablet by mouth daily.    . ondansetron (ZOFRAN) 8 MG tablet Take 8 mg every 8 (eight) hours as needed by mouth for nausea or vomiting.   3  . Probiotic Product (PROBIOTIC PO) Take 1 capsule daily by mouth.     Marland Kitchen albuterol (PROVENTIL HFA;VENTOLIN HFA) 108 (90 Base) MCG/ACT inhaler Inhale 2 puffs into the lungs every 6 (six) hours as needed for wheezing or shortness of breath. 1 Inhaler 2       Blood pressure 135/82, pulse 89, temperature 98 F (36.7 C), temperature source Oral, resp. rate 16, height 5\' 4"  (1.626 m), weight 209 lb (94.8 kg). PE: VSSAF        ABD- gravid, non tender        FHTs-reactive   Lab Results  Component Value Date   WBC 8.6 03/15/2017   HGB 11.6 (  L) 03/15/2017   HCT 36.2 03/15/2017   MCV 89.6 03/15/2017   PLT 241 03/15/2017   Lab Results  Component Value Date   PREGTESTUR NEGATIVE 07/09/2014   PREGSERUM NEGATIVE 03/09/2012       Patient Active Problem List   Diagnosis Date Noted  . PROM (premature rupture of membranes) 03/15/2017  . Moderate malnutrition ([place]) 03/08/2013  . Abdominal pain, chronic, right lower quadrant 03/05/2013  . Nausea with vomiting 03/05/2013  . 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 Renal stone 05/27/2012  . Renal colic on right side 07/17/2246  . 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 Medication side effect 11/04/2011  . History of anemia 04/15/2011   IMP/ IUP at term          SROM         Crohn's PLAN/ Admit ANDERSON,MARK E 03/15/2017, 1:47 PM

## 2017-03-15 NOTE — Anesthesia Pain Management Evaluation Note (Signed)
  CRNA Pain Management Visit Note  Patient: Robin Miller, 26 y.o., female  "Hello I am a member of the anesthesia team at [facility] [place]. We have an anesthesia team available at all times to provide care throughout the hospital, including epidural management and anesthesia for C-section. I don't know your plan for the delivery whether it a natural birth, water birth, IV sedation, nitrous supplementation, doula or epidural, but we want to meet your pain goals."   1.Was your pain managed to your expectations on prior hospitalizations?   Yes   2.What is your expectation for pain management during this hospitalization?     IV pain meds  3.How can we help you reach that goal? Support prn  Record the patient's initial score and the patient's pain goal.   Pain: 3  Pain Goal: 6 The [facility] wants you to be able to say your pain was always managed very well.  [facility] 03/15/2017

## 2017-03-15 NOTE — MAU Provider Note (Signed)
Chief Complaint:  Rupture of Membranes   Seen by provider at about 0245   HPI: Robin Miller is a 26 y.o. G1P0 at 55w0dwho presents to maternity admissions reporting leaking fluid tonight.  Was noted to have elevated BPs but states has never had issues with that before.. She reports good fetal movement, denies LOF, vaginal bleeding, vaginal itching/burning, urinary symptoms, h/a, dizziness, n/v, diarrhea, constipation or fever/chills.  She denies headache, visual changes or RUQ abdominal pain.  Vaginal Discharge  The patient's primary symptoms include vaginal discharge. The patient's pertinent negatives include no genital itching, genital lesions, genital odor or vaginal bleeding. This is a new problem. The current episode started today. The problem occurs constantly. The problem has been unchanged. The pain is mild. She is pregnant. Associated symptoms include abdominal pain. Pertinent negatives include no back pain, constipation, diarrhea, fever, nausea or vomiting. The vaginal discharge was clear. There has been no bleeding. She has not been passing clots. She has not been passing tissue. Nothing aggravates the symptoms. She has tried nothing for the symptoms.     Past Medical History: Past Medical History:  Diagnosis Date  . Anemia   . Cancer ([place])    basal cell back and face  . 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 Crohn's ileocolitis ([place]) 11/04/2011  . Depression   . Erosive esophagitis    LA Class B  . GERD (gastroesophageal reflux disease)   . Nausea vomiting and diarrhea   . Vasovagal syncope     Past obstetric history: OB History  Gravida Para Term Preterm AB Living  1            SAB TAB Ectopic Multiple Live Births               # Outcome Date GA Lbr Len/2nd Weight Sex Delivery Anes PTL Lv  1 Current               Past Surgical History: Past Surgical History:  Procedure Laterality Date  . COLONOSCOPY     . COLONOSCOPY N/A 03/07/2013   Performed by Gatha Mayer, MD at [place]  . CYSTOSCOPY WITH RETROGRADE PYELOGRAM, URETEROSCOPY AND STENT PLACEMENT Right 05/27/2012   Performed by Dutch Gray, MD at [facility] ORS  . ESOPHAGOGASTRODUODENOSCOPY (EGD) N/A 03/04/2013   Performed by Jerene Bears, MD at [place]  . HOLMIUM LASER APPLICATION Right 0/25/4270   Performed by Dutch Gray, MD at [facility] ORS  . ileocetomy  03/16/2012  . LAPAROSCOPIC CHOLECYSTECTOMY N/A 03/09/2013   Performed by Greer Pickerel, MD at [facility] ORS  . LAPAROSCOPIC ILEOCECECTOMY N/A 03/16/2012   Performed by Adin Hector., MD at [facility] OR  . SKIN CANCER EXCISION    . TONSILLECTOMY AND ADENOIDECTOMY    . UPPER GASTROINTESTINAL ENDOSCOPY      Family History: Family History  Problem Relation Age of Onset  . Stroke Mother   . Breast cancer Mother   . Crohn's disease Father   . Breast cancer Paternal Aunt   . Colitis Maternal Aunt   . Colon cancer Paternal Grandmother        ? cancer, part of colon removed  . Stroke Paternal Grandmother   . Esophageal cancer Neg Hx   . Pancreatic cancer Neg Hx   . Rectal cancer Neg Hx   . Stomach cancer Neg Hx     Social History: Social History   Tobacco Use  . Smoking  status: Never Smoker  . Smokeless tobacco: Never Used  Substance Use Topics  . Alcohol use: No  . Drug use: No    Allergies:  Allergies  Allergen Reactions  . Prednisone     Blurred vision, ringing in ears, insomnia, shaky, body convulsions,nausea    Meds:  Medications Prior to Admission  Medication Sig Dispense Refill Last Dose  . mesalamine (PENTASA) 500 MG CR capsule Take 500 mg daily by mouth.   Past Week at Unknown time  . Multiple Vitamin (MULTIVITAMIN WITH MINERALS) TABS tablet Take 1 tablet by mouth daily.   03/14/2017 at Unknown time  . Probiotic Product (PROBIOTIC PO) Take by mouth.   03/14/2017 at Unknown time  . acetaminophen (TYLENOL) 325 MG tablet Take 650 mg by mouth as needed.   More than a  month at Unknown time  . albuterol (PROVENTIL HFA;VENTOLIN HFA) 108 (90 Base) MCG/ACT inhaler Inhale 2 puffs into the lungs every 6 (six) hours as needed for wheezing or shortness of breath. 1 Inhaler 2 Unknown at Unknown time  . esomeprazole (NEXIUM) 20 MG capsule Take 20 mg by mouth daily as needed.    More than a month at Unknown time    I have reviewed patient's Past Medical Hx, Surgical Hx, Family Hx, Social Hx, medications and allergies.   ROS:  Review of Systems  Constitutional: Negative for fever.  Gastrointestinal: Positive for abdominal pain. Negative for constipation, diarrhea, nausea and vomiting.  Genitourinary: Positive for vaginal discharge.  Musculoskeletal: Negative for back pain.   Other systems negative  Physical Exam   Patient Vitals for the past 24 hrs:  BP Temp Pulse Resp Height Weight  03/15/17 0232 127/86 - (!) 101 - - -  03/15/17 0207 130/90 - (!) 110 - - -  03/15/17 0206 - 98 F (36.7 C) - 18 5\' 4"  (1.626 m) 209 lb (94.8 kg)   Vitals:   03/15/17 0206 03/15/17 0207 03/15/17 0232  BP:  130/90 127/86  Pulse:  (!) 110 (!) 101  Resp: 18    Temp: 98 F (36.7 C)    Weight: 209 lb (94.8 kg)    Height: 5\' 4"  (1.626 m)      Constitutional: Well-developed, well-nourished female in no acute distress.  Cardiovascular: normal rate and rhythm Respiratory: normal effort, clear to auscultation bilaterally GI: Abd soft, non-tender, gravid appropriate for gestational age.   No rebound or guarding. MS: Extremities nontender, no edema, normal ROM Neurologic: Alert and oriented x 4.  GU: Neg CVAT.  PELVIC EXAM: Cervix pink, visually closed, without lesion, scant clear discharge, vaginal walls and external genitalia normal Bimanual exam: Cervix firm, posterior, neg CMT, uterus nontender, Fundal Height consistent with dates, adnexa without tenderness, enlargement, or mass  Dilation: 1 Effacement (%): 80 Station: -1 Presentation: Vertex Exam by:: Hansel Feinstein  CNM  FHT:  Baseline 140 , moderate variability, accelerations present, no decelerations Contractions:  Irregular     Labs: Results for orders placed or performed during the hospital encounter of 03/15/17 (from the past 24 hour(s))  Fern Test     Status: Normal   Collection Time: 03/15/17  3:04 AM  Result Value Ref Range   POCT Fern Test Positive = ruptured amniotic membanes    Preeclampsia labs pending  Imaging:  No results found.  MAU Course/MDM: I have ordered labs and reviewed results. Preeclampsia labs ordered NST reviewed Consult Dr Ouida Sills by RN with presentation, exam findings and test results.    Assessment: Single IUP  at [redacted]w[redacted]d PROM at term Elevated BPs, ?Gestational Hypertension   Plan: Admit to [facility] Dr Ouida Sills to follow  Hansel Feinstein CNM, MSN Certified Nurse-Midwife 03/15/2017 3:16 AM

## 2017-03-15 NOTE — Progress Notes (Signed)
Dr Ouida Sills notified of pt's admission and status. Aware of srom with cl fld, sve, elevated b/ps, labs ordered per Carmelia Roller CNM, reactive FHR. Will admit to [facility]

## 2017-03-15 NOTE — Anesthesia Procedure Notes (Signed)
Epidural Patient location during procedure: OB Start time: 03/15/2017 2:36 PM End time: 03/15/2017 2:39 PM  Staffing Anesthesiologist: Lyn Hollingshead, MD Performed: anesthesiologist   Preanesthetic Checklist Completed: patient identified, site marked, surgical consent, pre-op evaluation, timeout performed, IV checked, risks and benefits discussed and monitors and equipment checked  Epidural Patient position: sitting Prep: site prepped and draped and DuraPrep Patient monitoring: continuous pulse ox and blood pressure Approach: midline Location: L3-L4 Injection technique: LOR air  Needle:  Needle type: Tuohy  Needle gauge: 17 G Needle length: 9 cm and 9 Needle insertion depth: 7 cm Catheter type: closed end flexible Catheter size: 19 Gauge Catheter at skin depth: 12 cm Test dose: negative and Other  Assessment Sensory level: T9 Events: blood not aspirated, injection not painful, no injection resistance, negative IV test and no paresthesia  Additional Notes Reason for block:procedure for pain

## 2017-03-15 NOTE — Progress Notes (Signed)
Carmelia Roller CNM notified of need to r/o SROM and elevated B/Ps. Will do labs

## 2017-03-15 NOTE — Anesthesia Preprocedure Evaluation (Addendum)
Anesthesia Evaluation  Patient identified by MRN, date of birth, ID band Patient awake    Reviewed: Allergy & Precautions, H&P , NPO status , Patient's Chart, lab work & pertinent test results  Airway Mallampati: II  TM Distance: >3 FB Neck ROM: full    Dental no notable dental hx. (+) Teeth Intact   Pulmonary neg pulmonary ROS,    Pulmonary exam normal breath sounds clear to auscultation       Cardiovascular negative cardio ROS Normal cardiovascular exam Rhythm:regular Rate:Normal     Neuro/Psych negative neurological ROS     GI/Hepatic Neg liver ROS,   Endo/Other  negative endocrine ROS  Renal/GU      Musculoskeletal   Abdominal (+) + obese,   Peds  Hematology   Anesthesia Other Findings   Reproductive/Obstetrics (+) Pregnancy                             Anesthesia Physical Anesthesia Plan  ASA: II  Anesthesia Plan: Epidural   Post-op Pain Management:    Induction:   PONV Risk Score and Plan:   Airway Management Planned: Natural Airway and Nasal Cannula  Additional Equipment:   Intra-op Plan:   Post-operative Plan:   Informed Consent: I have reviewed the patients History and Physical, chart, labs and discussed the procedure including the risks, benefits and alternatives for the proposed anesthesia with the patient or authorized representative who has indicated his/her understanding and acceptance.     Plan Discussed with: CRNA and Surgeon  Anesthesia Plan Comments: (For C/S with labor epidural)       Anesthesia Quick Evaluation

## 2017-03-15 NOTE — MAU Note (Signed)
Leaking fld since 0030. Had small gush and then just staying wet. Fld is clear. 1.5cm on Fri and membranes stripped

## 2017-03-16 ENCOUNTER — Encounter (HOSPITAL_COMMUNITY): Payer: Self-pay | Admitting: Neonatal-Perinatal Medicine

## 2017-03-16 DIAGNOSIS — Z3490 Encounter for supervision of normal pregnancy, unspecified, unspecified trimester: Secondary | ICD-10-CM

## 2017-03-16 LAB — CBC
HCT: 28.9 % — ABNORMAL LOW (ref 36.0–46.0)
Hemoglobin: 9.3 g/dL — ABNORMAL LOW (ref 12.0–15.0)
MCH: 28.8 pg (ref 26.0–34.0)
MCHC: 32.2 g/dL (ref 30.0–36.0)
MCV: 89.5 fL (ref 78.0–100.0)
PLATELETS: 216 10*3/uL (ref 150–400)
RBC: 3.23 MIL/uL — AB (ref 3.87–5.11)
RDW: 15.1 % (ref 11.5–15.5)
WBC: 17.2 10*3/uL — AB (ref 4.0–10.5)

## 2017-03-16 MED ORDER — LACTATED RINGERS IV SOLN
INTRAVENOUS | Status: DC
  Administered 2017-03-16: 12:00:00 via INTRAVENOUS
  Administered 2017-03-16: 999 mL via INTRAVENOUS

## 2017-03-16 MED ORDER — PRENATAL MULTIVITAMIN CH
1.0000 | ORAL_TABLET | Freq: Every day | ORAL | Status: DC
  Administered 2017-03-16 – 2017-03-19 (×4): 1 via ORAL
  Filled 2017-03-16 (×5): qty 1

## 2017-03-16 MED ORDER — OXYCODONE-ACETAMINOPHEN 5-325 MG PO TABS
1.0000 | ORAL_TABLET | ORAL | Status: DC | PRN
  Administered 2017-03-16 – 2017-03-18 (×2): 1 via ORAL
  Filled 2017-03-16: qty 1

## 2017-03-16 MED ORDER — ZOLPIDEM TARTRATE 5 MG PO TABS: 5.0000 mg | ORAL_TABLET | Freq: Every evening | ORAL | Status: DC | PRN

## 2017-03-16 MED ORDER — KETOROLAC TROMETHAMINE 30 MG/ML IJ SOLN
30.0000 mg | Freq: Four times a day (QID) | INTRAMUSCULAR | Status: AC | PRN
  Administered 2017-03-16 (×2): 30 mg via INTRAVENOUS
  Filled 2017-03-16 (×2): qty 1

## 2017-03-16 MED ORDER — PROMETHAZINE HCL 25 MG/ML IJ SOLN: 6.2500 mg | INTRAMUSCULAR | Status: DC | PRN

## 2017-03-16 MED ORDER — ACETAMINOPHEN 325 MG PO TABS: 650.0000 mg | ORAL_TABLET | ORAL | Status: DC | PRN

## 2017-03-16 MED ORDER — OXYTOCIN 40 UNITS IN LACTATED RINGERS INFUSION - SIMPLE MED: 2.5000 [IU]/h | INTRAVENOUS | Status: AC

## 2017-03-16 MED ORDER — DIPHENHYDRAMINE HCL 25 MG PO CAPS
25.0000 mg | ORAL_CAPSULE | ORAL | Status: DC | PRN
  Filled 2017-03-16: qty 1

## 2017-03-16 MED ORDER — WITCH HAZEL-GLYCERIN EX PADS: 1.0000 "application " | MEDICATED_PAD | CUTANEOUS | Status: DC | PRN

## 2017-03-16 MED ORDER — NALBUPHINE HCL 10 MG/ML IJ SOLN: 5.0000 mg | Freq: Once | INTRAMUSCULAR | Status: DC | PRN

## 2017-03-16 MED ORDER — ONDANSETRON HCL 4 MG/2ML IJ SOLN: 4.0000 mg | Freq: Three times a day (TID) | INTRAMUSCULAR | Status: DC | PRN

## 2017-03-16 MED ORDER — COCONUT OIL OIL
1.0000 "application " | TOPICAL_OIL | Status: DC | PRN
  Filled 2017-03-16: qty 120

## 2017-03-16 MED ORDER — SODIUM CHLORIDE 0.9% FLUSH: 3.0000 mL | INTRAVENOUS | Status: DC | PRN

## 2017-03-16 MED ORDER — ACETAMINOPHEN 500 MG PO TABS
1000.0000 mg | ORAL_TABLET | Freq: Four times a day (QID) | ORAL | Status: AC
  Administered 2017-03-16 (×2): 1000 mg via ORAL
  Filled 2017-03-16 (×2): qty 2

## 2017-03-16 MED ORDER — OXYCODONE-ACETAMINOPHEN 5-325 MG PO TABS
2.0000 | ORAL_TABLET | ORAL | Status: DC | PRN
  Administered 2017-03-17 – 2017-03-20 (×12): 2 via ORAL
  Filled 2017-03-16 (×13): qty 2

## 2017-03-16 MED ORDER — NALBUPHINE HCL 10 MG/ML IJ SOLN: 5.0000 mg | INTRAMUSCULAR | Status: DC | PRN

## 2017-03-16 MED ORDER — MEASLES, MUMPS & RUBELLA VAC ~~LOC~~ INJ
0.5000 mL | INJECTION | Freq: Once | SUBCUTANEOUS | Status: DC
  Filled 2017-03-16: qty 0.5

## 2017-03-16 MED ORDER — NALOXONE HCL 0.4 MG/ML IJ SOLN
1.0000 ug/kg/h | INTRAMUSCULAR | Status: DC | PRN
  Filled 2017-03-16: qty 5

## 2017-03-16 MED ORDER — MORPHINE SULFATE (PF) 0.5 MG/ML IJ SOLN
INTRAMUSCULAR | Status: DC | PRN
  Administered 2017-03-16: 4 mg via EPIDURAL

## 2017-03-16 MED ORDER — MEPERIDINE HCL 25 MG/ML IJ SOLN: 6.2500 mg | INTRAMUSCULAR | Status: DC | PRN

## 2017-03-16 MED ORDER — CEFAZOLIN SODIUM-DEXTROSE 2-3 GM-%(50ML) IV SOLR
INTRAVENOUS | Status: DC | PRN
  Administered 2017-03-15: 2 g via INTRAVENOUS

## 2017-03-16 MED ORDER — HYDROMORPHONE HCL 1 MG/ML IJ SOLN: 0.2500 mg | INTRAMUSCULAR | Status: DC | PRN

## 2017-03-16 MED ORDER — METHYLERGONOVINE MALEATE 0.2 MG/ML IJ SOLN
INTRAMUSCULAR | Status: AC
  Filled 2017-03-16: qty 1

## 2017-03-16 MED ORDER — LACTATED RINGERS IV SOLN
INTRAVENOUS | Status: DC | PRN
  Administered 2017-03-16: 40 [IU] via INTRAVENOUS

## 2017-03-16 MED ORDER — DIPHENHYDRAMINE HCL 50 MG/ML IJ SOLN: 12.5000 mg | INTRAMUSCULAR | Status: DC | PRN

## 2017-03-16 MED ORDER — KETOROLAC TROMETHAMINE 30 MG/ML IJ SOLN: 30.0000 mg | Freq: Once | INTRAMUSCULAR | Status: DC | PRN

## 2017-03-16 MED ORDER — DIBUCAINE 1 % RE OINT
1.0000 "application " | TOPICAL_OINTMENT | RECTAL | Status: DC | PRN
  Filled 2017-03-16: qty 28

## 2017-03-16 MED ORDER — CEFAZOLIN SODIUM-DEXTROSE 2-3 GM-%(50ML) IV SOLR
INTRAVENOUS | Status: AC
  Filled 2017-03-16: qty 50

## 2017-03-16 MED ORDER — METHYLERGONOVINE MALEATE 0.2 MG/ML IJ SOLN
INTRAMUSCULAR | Status: DC | PRN
  Administered 2017-03-16: 0.2 mg via INTRAMUSCULAR

## 2017-03-16 MED ORDER — SIMETHICONE 80 MG PO CHEW
80.0000 mg | CHEWABLE_TABLET | ORAL | Status: DC
  Administered 2017-03-16 – 2017-03-20 (×5): 80 mg via ORAL
  Filled 2017-03-16 (×6): qty 1

## 2017-03-16 MED ORDER — TETANUS-DIPHTH-ACELL PERTUSSIS 5-2.5-18.5 LF-MCG/0.5 IM SUSP: 0.5000 mL | Freq: Once | INTRAMUSCULAR | Status: DC

## 2017-03-16 MED ORDER — LACTATED RINGERS IV SOLN
INTRAVENOUS | Status: DC | PRN
  Administered 2017-03-16: via INTRAVENOUS

## 2017-03-16 MED ORDER — SENNOSIDES-DOCUSATE SODIUM 8.6-50 MG PO TABS
2.0000 | ORAL_TABLET | ORAL | Status: DC
  Administered 2017-03-16 – 2017-03-20 (×4): 2 via ORAL
  Filled 2017-03-16 (×5): qty 2

## 2017-03-16 MED ORDER — SCOPOLAMINE 1 MG/3DAYS TD PT72: 1.0000 | MEDICATED_PATCH | Freq: Once | TRANSDERMAL | Status: DC

## 2017-03-16 MED ORDER — IBUPROFEN 600 MG PO TABS
600.0000 mg | ORAL_TABLET | Freq: Four times a day (QID) | ORAL | Status: DC
  Administered 2017-03-16 – 2017-03-20 (×15): 600 mg via ORAL
  Filled 2017-03-16 (×15): qty 1

## 2017-03-16 MED ORDER — KETOROLAC TROMETHAMINE 30 MG/ML IJ SOLN: 30.0000 mg | Freq: Four times a day (QID) | INTRAMUSCULAR | Status: AC | PRN

## 2017-03-16 MED ORDER — NALOXONE HCL 0.4 MG/ML IJ SOLN: 0.4000 mg | INTRAMUSCULAR | Status: DC | PRN

## 2017-03-16 NOTE — Addendum Note (Signed)
Addendum  created 03/16/17 0846 by Hewitt Blade, CRNA   Charge Capture section accepted, Sign clinical note

## 2017-03-16 NOTE — Progress Notes (Signed)
Patient is eating, ambulating, voiding.  Pain control is good.  Appropriate lochia.  No complaints.  Vitals:   03/16/17 0258 03/16/17 0427 03/16/17 0527 03/16/17 0624  BP: 108/63 (!) 98/58 113/63 (!) 110/58  Pulse: (!) 109 (!) 104 (!) 104 (!) 106  Resp: 16 16 16 16   Temp: 98.3 F (36.8 C) 98.1 F (36.7 C) 98.5 F (36.9 C) 99.3 F (37.4 C)  TempSrc: Oral Oral Oral Oral  SpO2: 96% 94% 97% 96%  Weight:      Height:        Fundus firm Perineum without swelling. No CT  Lab Results  Component Value Date   WBC 17.2 (H) 03/16/2017   HGB 9.3 (L) 03/16/2017   HCT 28.9 (L) 03/16/2017   MCV 89.5 03/16/2017   PLT 216 03/16/2017    --/--/O POS, O POS (11/18 8592)  A/P Post op day #0. Doing well. Mild BPs last night, normal since.  Routine care.     Allyn Kenner

## 2017-03-16 NOTE — Anesthesia Postprocedure Evaluation (Signed)
Anesthesia Post Note  Patient: Robin Miller  Procedure(s) Performed: CESAREAN SECTION (N/A )     Patient location during evaluation: PACU Anesthesia Type: Epidural Level of consciousness: awake Pain management: pain level controlled Vital Signs Assessment: post-procedure vital signs reviewed and stable Respiratory status: spontaneous breathing Cardiovascular status: stable Postop Assessment: no headache, no backache, epidural receding, patient able to bend at knees and no apparent nausea or vomiting Anesthetic complications: no    Last Vitals:  Vitals:   03/16/17 0130 03/16/17 0145  BP: 113/63 116/75  Pulse: (!) 113 (!) 111  Resp: 17 19  Temp: 36.6 C   SpO2: 98% 99%    Last Pain:  Vitals:   03/16/17 0130  TempSrc: Oral  PainSc:    Pain Goal:                 [name] JR,JOHN Makayla Lanter

## 2017-03-16 NOTE — Anesthesia Postprocedure Evaluation (Signed)
Anesthesia Post Note  Patient: Robin Miller  Procedure(s) Performed: CESAREAN SECTION (N/A )     Patient location during evaluation: Mother Baby Anesthesia Type: Epidural Level of consciousness: awake and alert and oriented Pain management: pain level controlled Vital Signs Assessment: post-procedure vital signs reviewed and stable Respiratory status: spontaneous breathing and nonlabored ventilation Cardiovascular status: stable Postop Assessment: no headache, patient able to bend at knees, no backache, no apparent nausea or vomiting, epidural receding and adequate PO intake Anesthetic complications: no    Last Vitals:  Vitals:   03/16/17 0527 03/16/17 0624  BP: 113/63 (!) 110/58  Pulse: (!) 104 (!) 106  Resp: 16 16  Temp: 36.9 C 37.4 C  SpO2: 97% 96%    Last Pain:  Vitals:   03/16/17 0624  TempSrc: Oral  PainSc: 1    Pain Goal:                 [employer]

## 2017-03-16 NOTE — Transfer of Care (Signed)
Immediate Anesthesia Transfer of Care Note  Patient: Robin Miller  Procedure(s) Performed: CESAREAN SECTION (N/A )  Patient Location: PACU  Anesthesia Type:Epidural  Level of Consciousness: awake  Airway & Oxygen Therapy: Patient Spontanous Breathing  Post-op Assessment: Report given to RN and Post -op Vital signs reviewed and stable  Post vital signs: stable  Last Vitals:  Vitals:   03/15/17 2334 03/15/17 2336  BP: (!) 156/95 (!) 158/85  Pulse: (!) 147 (!) 160  Resp:    Temp:    SpO2:      Last Pain:  Vitals:   03/15/17 2300  TempSrc: Oral  PainSc: 0-No pain         Complications: No apparent anesthesia complications

## 2017-03-16 NOTE — Op Note (Signed)
NAMELAQUONDA, Robin Miller NO.:  [id number]  MEDICAL RECORD NO.:  [id number]  LOCATION:  9163                          FACILITY:  [facility]  PHYSICIAN:  Freda Munro, M.D.    DATE OF BIRTH:  03-Apr-1991  DATE OF PROCEDURE:  03/16/2017 DATE OF DISCHARGE:                              OPERATIVE REPORT   PREOPERATIVE DIAGNOSES: 1. Intrauterine pregnancy at term. 2. Failure to progress. 3. Chorioamnionitis.  POSTOPERATIVE DIAGNOSES: 1. Intrauterine pregnancy at term. 2. Failure to progress. 3. Chorioamnionitis.  PROCEDURE:  Primary low transverse cesarean section.  SURGEON:  Freda Munro, M.D.  ANESTHESIA:  Epidural.  ANTIBIOTICS:  Ancef 2 g.  DRAINS:  Foley at bedside drainage.  ESTIMATED BLOOD LOSS:  900 mL.  SPECIMENS:  None.  FINDINGS:  The patient had normal fallopian tubes and ovaries bilaterally.  The uterus appeared normal.  The placenta appeared to be normal.  DESCRIPTION OF PROCEDURE:  The patient was taken to the operating room where she was placed in a dorsal supine position with left lateral tilt. Her epidural anesthetic was reinjected.  She was prepped and draped in the usual fashion for this procedure.  Once an adequate level was reached, a Pfannenstiel incision was made.  This was carried down to the peritoneal cavity.  On entering the peritoneal cavity, a bladder flap was taken down with sharp dissection.  A low-transverse uterine incision was made in the midline and extended laterally with blunt dissection. Amniotic fluid was noted to be clear.  The infant was delivered in a vertex presentation.  After the head was delivered, the oropharynx and nostrils were bulb suctioned.  The remaining infant was then delivered. The cord was doubly clamped and cut and the infant handed to the awaiting NICU team.  Cord blood was then obtained.  Placenta was manually removed.  The uterus was exteriorized, and uterine cavity wiped with a wet lap.  The  uterine incision was closed with 2 layers of 0 Monocryl suture in a running locking fashion, the 2nd was an imbricating layer.  The uterus was placed back in the abdominal cavity.  Hemostasis was rechecked and felt to be adequate.  The bladder flap was closed using 2-0 Monocryl in a running fashion.  The parietal peritoneum and rectus muscles were reapproximated in midline using 2-0 Monocryl in a running fashion.  The fascia was closed using 0 Monocryl suture in a running fashion.  Subcuticular tissue was irrigated and made hemostatic with the Bovie.  The subcuticular tissue was then closed using interrupted 2-0 plain gut suture.  The skin was closed using 4-0 Vicryl suture in a subcuticular fashion.  The skin was then closed with Steri- Strips.  The patient was taken to the recovery room in stable condition. Instrument and lap count were correct x3.          ______________________________ Freda Munro, M.D.     MA/MEDQ  D:  03/16/2017  T:  03/16/2017  Job:  [id number]

## 2017-03-16 NOTE — Lactation Note (Signed)
This note was copied from a baby's chart. Lactation Consultation Note  Patient Name: Robin Miller Date: 03/16/2017 Reason for consult: Initial assessment;Primapara;Term Breastfeeding consultation services and support information given.  This is mom's first baby and newborn is 85 hours old.  Baby has been to breast briefly at times.  Mom reports baby has been spitting up fluid today and not showing much interest in suckling.  Reassured.  Skin to skin encouraged.  Maternal Data Has patient been taught Hand Expression?: Yes Does the patient have breastfeeding experience prior to this delivery?: No  Feeding    LATCH Score                   Interventions    Lactation Tools Discussed/Used     Consult Status Consult Status: Follow-up Date: 03/17/17 Follow-up type: In-patient    Ave Filter 03/16/2017, 2:07 PM

## 2017-03-17 NOTE — Lactation Note (Signed)
This note was copied from a baby's chart. Lactation Consultation Note Baby 56 hrs old. Hasn't fed well. Mom stated baby BF for 10 min. Earlier to [employer]. Breast w/#16 NS mom brought from home. Fits well. #16 to small for Rt. Nipple. Fitted mom #20 NS. Shells given to wear today in bra. Mom has hand pump, encouraged to pre-pump. Colostrum noted w/hand expression. Encouraged to hand express and give colostrum via spoon.  Baby nose stuffy, noted burping several times when trying to latch. Baby latched in football hold, held nipple shield in mouth would occasionally do non-nutritive suckling.  Encouraged football hold d/t nasal congestion and NS until better. Mom has been BF in cradle position. Baby fussy, not suckling, frequently burping. Encouraged mom to hold baby STS and burp.  Mom is trying every 2-3 hrs for feeding. Encouraged to call for assistance if needed.   Patient Name: Robin Miller [id number] Date: 03/17/2017 Reason for consult: Follow-up assessment;Difficult latch   Maternal Data    Feeding Length of feed: 0 min  LATCH Score Latch: Too sleepy or reluctant, no latch achieved, no sucking elicited.  Audible Swallowing: None  Type of Nipple: Flat  Comfort (Breast/Nipple): Soft / non-tender  Hold (Positioning): Assistance needed to correctly position infant at breast and maintain latch.  LATCH Score: 4  Interventions Interventions: Breast feeding basics reviewed;Assisted with latch;Breast compression;Shells;Skin to skin;Adjust position;Breast massage;Support pillows;Hand pump;Hand express;Position options;Pre-pump if needed  Lactation Tools Discussed/Used Tools: Shells;Pump Shell Type: Inverted Breast pump type: Manual Pump Review: Setup, frequency, and cleaning;Milk Storage Initiated by:: RN Date initiated:: 03/16/17   Consult Status Consult Status: Follow-up Date: 03/17/17 Follow-up type: In-patient    Melroy Bougher, [place]ta [place] 03/17/2017, 4:03 AM

## 2017-03-17 NOTE — Progress Notes (Signed)
Pt 6 hours after foley removed with only 124mL urine output. Pt feeling no need to empty. RN encouraged fluids and at 2330 pt emptied 261mL. RN went in to bladder scan pt, pt found to be in bathroom again, emptied 315mL urine. Bladder scan volume 156mL. RN encouraged pt to drink plenty of fluids and try to empty bladder every 2-3 hours.

## 2017-03-17 NOTE — Progress Notes (Signed)
Subjective: Postpartum Day 1: Cesarean Delivery Patient reports incisional pain, tolerating PO, + flatus and no problems voiding.    Objective: Vital signs in last 24 hours: Temp:  [98.1 F (36.7 C)-99.1 F (37.3 C)] 98.1 F (36.7 C) (11/20 0541) Pulse Rate:  [87-100] 88 (11/20 0541) Resp:  [18-20] 18 (11/20 0541) BP: (100-112)/(60-67) 101/60 (11/20 0541) SpO2:  [97 %-98 %] 98 % (11/19 2215)  Physical Exam:  General: alert, cooperative and no distress Lochia: appropriate Uterine Fundus: firm Incision: healing well, no dehiscence, no significant erythema, small amount of serosanguinous drainage on left side DVT Evaluation: No evidence of DVT seen on physical exam. Negative Homan's sign. No cords or calf tenderness.  Recent Labs    03/15/17 0312 03/16/17 0601  HGB 11.6* 9.3*  HCT 36.2 28.9*    Assessment/Plan: Status post Cesarean section. Doing well postoperatively.  Continue current post op care. Encourage ambulation  Raianna Slight, [place] 03/17/2017, 9:18 AM

## 2017-03-17 NOTE — Lactation Note (Signed)
This note was copied from a baby's chart. Lactation Consultation Note  Patient Name: Girl Juwana Thoreson [id number] Date: 03/17/2017 Reason for consult: Follow-up assessment;Difficult latch   Follow up with mom of 5 hour old infant at RN request since infant is not feeding well.   Infant was laying in the crib suckling on a pacifer, cautioned parents against pacifier use for the first few weeks. Enc mom to ensure all sucking needs are met at the breast.   Assisted mom with postioning infant to the right breast in the football hold. Applied # 20 NS and attempted to latch infant, infant would not suckle. Infant would suckle on gloved finger. Hand expressed mom and obtained 2-3 gtts from each breast which was finger fed to infant. Then placed # 24 NS, infant did sustain latch longer with larger NS and suckled on and off for about 10 minutes. Infant then fell asleep.  Infant is nasally congested, small amount of mucous was suctioned from left nostril. When infant latches to the breast and takes deep breaths, she does get agitated, once she calms she is willing to suckle better. Parents report infant has been spitty.   Dad held infant STS while mom started to pump. Reviewed with mom to offer breast with each feeding using the # 24 NS. Enc mom to pump post BF (at lease every 3 hours) on Initiate setting with DEBP for 15 minutes and follow with hand expression. Advised mom to give all EBM to infant that is obtained, mom voiced understanding.   Discussed with mom that infant has good output at this time and weight loss is within normal limits at this time. Mom reports she prefers not to give formula at this time.   Report to Sherlyn Lees, RN.    Maternal Data Formula Feeding for Exclusion: No Has patient been taught Hand Expression?: Yes Does the patient have breastfeeding experience prior to this delivery?: No  Feeding Feeding Type: Breast Fed Length of feed: 5 min  LATCH Score Latch: Repeated  attempts needed to sustain latch, nipple held in mouth throughout feeding, stimulation needed to elicit sucking reflex.  Audible Swallowing: None  Type of Nipple: Flat  Comfort (Breast/Nipple): Soft / non-tender  Hold (Positioning): Assistance needed to correctly position infant at breast and maintain latch.  LATCH Score: 5  Interventions Interventions: Breast feeding basics reviewed;Support pillows;Assisted with latch;Position options;Skin to skin;Expressed milk;Hand express;Shells;Pre-pump if needed;Breast compression;Hand pump;DEBP  Lactation Tools Discussed/Used Tools: Shells;Nipple [place] [employer];Pump Nipple shield size: 20;24 Shell Type: Inverted Breast pump type: Double-Electric Breast Pump;Manual WIC Program: No Pump Review: Setup, frequency, and cleaning;Milk Storage Initiated by:: Reviewed and encouraged every 2-3 hours post BF   Consult Status Consult Status: Follow-up Date: 03/18/17 Follow-up type: In-patient    Debby Freiberg Tirso Laws 03/17/2017, 2:56 PM

## 2017-03-18 MED ORDER — ONDANSETRON HCL 4 MG PO TABS
4.0000 mg | ORAL_TABLET | ORAL | Status: DC | PRN
  Administered 2017-03-18: 4 mg via ORAL
  Filled 2017-03-18: qty 1

## 2017-03-18 NOTE — Lactation Note (Addendum)
This note was copied from a baby's chart. Lactation Consultation Note  Patient Name: Robin Miller [id number] Date: 03/18/2017 Reason for consult: Follow-up assessment;1st time breastfeeding;Primapara;Infant weight loss(8% weight loss. baby starting to stir and mom eating lunch , LC enc mom to call when finished with lunch )   Maternal Data    Feeding                                     This feeding assessment by the [facility] RN  Feeding Type: Breast Milk Length of feed: 3 min  LATCH Score Latch: Repeated attempts needed to sustain latch, nipple held in mouth throughout feeding, stimulation needed to elicit sucking reflex.  Audible Swallowing: A few with stimulation  Type of Nipple: Flat  Comfort (Breast/Nipple): Soft / non-tender  Hold (Positioning): Assistance needed to correctly position infant at breast and maintain latch.  LATCH Score: 6  Interventions Interventions: Breast feeding basics reviewed  Lactation Tools Discussed/Used Tools: Nipple Shields Nipple shield size: 20   Consult Status Consult Status: Follow-up Date: 03/18/17 Follow-up type: In-patient    [place] 03/18/2017, 2:14 PM

## 2017-03-18 NOTE — Progress Notes (Signed)
Patient is doing well.  She is tolerating PO, ambulating, voiding.  Pain is controlled at rest, still with moderate pain with movement.  Lochia is appropriate  Reports baby not latching well and is losing weight  Vitals:   03/16/17 2046 03/16/17 2215 03/17/17 0541 03/18/17 0606  BP:  104/62 101/60 121/75  Pulse:  89 88 74  Resp:  18 18 18   Temp:  98.4 F (36.9 C) 98.1 F (36.7 C) 98.1 F (36.7 C)  TempSrc:  Oral Oral Oral  SpO2: 98% 98%    Weight:      Height:        NAD Abdomen:  soft, appropriate tenderness, incisions intact and without erythema or drainage ext:    Symmetric, 1+ edema bilaterally  Lab Results  Component Value Date   WBC 17.2 (H) 03/16/2017   HGB 9.3 (L) 03/16/2017   HCT 28.9 (L) 03/16/2017   MCV 89.5 03/16/2017   PLT 216 03/16/2017    --/--/O POS, O POS (11/18 0312)/RImmune  A/P    26 y.o. G1P1001 POD 2 s/p primary C/s Routine post op and postpartum care.   Baby not feeding well --will stay until tomorrow

## 2017-03-18 NOTE — Plan of Care (Signed)
Progressing activity well. Controlling pain with ordered medication. Bonding well with newborn and working on feedings.

## 2017-03-18 NOTE — Lactation Note (Signed)
This note was copied from a baby's chart. Lactation Consultation Note  Patient Name: Robin Miller [id number] Date: 03/18/2017 Reason for consult: Follow-up assessment;Difficult latch;Infant weight loss  Baby is 57 plus hours old and has ben having a difficult time latching and needing a NS to latch.  @ consult - STS , 1st tried latching STS without the NS and due to the baby's high palate, and recessed  Chin baby unable to sustain the depth. LC had resized mom for a NS and the #24 NS was the best fit.  LC applied the NS and instilled formula in the top, the baby latched well with depth and was feeding well in a pattern  Until the NS was empty and then would stay latched and sit while latched.  LC released baby and inserted the 5 F SNS inside the NS/ baby re-latched and fed for 20 mins and took 12 ml of formula  And was satisfied. Since the baby has had small amounts thus far , LC instructed mom and dad to gradually increase the  Volume next feeding 15 -20 ml. Also to wear shells between feedings except sleep.  Mom post pumped for 20 mins with EBM yield for next feeding.  Mom and dad receptive to teaching and [facility] plan and seemed pleased with feeding outcome.  MBURN aware of the [place].    Maternal Data Has patient been taught Hand Expression?: Yes  Feeding Feeding Type: Breast Fed Length of feed: 20 min  LATCH Score Latch: Grasps breast easily, tongue down, lips flanged, rhythmical sucking.(attempted 1st without NS , no suceess )  Audible Swallowing: Spontaneous and intermittent  Type of Nipple: Flat  Comfort (Breast/Nipple): Soft / non-tender  Hold (Positioning): Assistance needed to correctly position infant at breast and maintain latch.  LATCH Score: 8  Interventions Interventions: DEBP;Shells(LC enc wearing breast shells between feedings )  Lactation Tools Discussed/Used Tools: Shells Nipple shield size: 24 Shell Type: Inverted Breast pump type: Double-Electric  Breast Pump   Consult Status Consult Status: Follow-up Date: 03/19/17 Follow-up type: In-patient    [place] 03/18/2017, 4:53 PM

## 2017-03-18 NOTE — Lactation Note (Signed)
This note was copied from a baby's chart. Lactation Consultation Note  Patient Name: Robin Miller [id number] Date: 03/18/2017 Reason for consult: Follow-up assessment;Difficult latch(baby having pictures done - will F/U )   Maternal Data    Feeding Feeding Type: Breast Fed Length of feed: 3 min  LATCH Score Latch: Repeated attempts needed to sustain latch, nipple held in mouth throughout feeding, stimulation needed to elicit sucking reflex.  Audible Swallowing: A few with stimulation  Type of Nipple: Flat  Comfort (Breast/Nipple): Soft / non-tender  Hold (Positioning): Assistance needed to correctly position infant at breast and maintain latch.  LATCH Score: 6  Interventions    Lactation Tools Discussed/Used Tools: Nipple Shields Nipple shield size: 20   Consult Status Consult Status: Follow-up Date: 03/18/17 Follow-up type: In-patient    [place] 03/18/2017, 1:48 PM

## 2017-03-19 NOTE — Lactation Note (Signed)
This note was copied from a baby's chart. Lactation Consultation Note  Patient Name: Robin Miller [id number] Date: 03/19/2017 Reason for consult: Follow-up assessment;Hyperbilirubinemia  Baby 17 hours old. Mom reports that she had pumped 10 ml of EBM earlier, but then only able to pump 7 ml this last pumping session. Discussed progression of milk coming to volume and enc mom to keep post-pumping followed by hand expression. Mom states that baby is transferring EBM/formula well while using the NS. Enc parents to give at least 30 ml of EBM/formula with each feeding. Discussed methods of moving away from using nipple shield when mom's volume of breast milk increases. Mom aware of OP/[facility]FS[facility] and [place] phone line assistance after D/C.   Maternal Data    Feeding Feeding Type: Breast Milk with Formula added Length of feed: 20 min  LATCH Score                   Interventions    Lactation Tools Discussed/Used     Consult Status Consult Status: Follow-up Date: 03/20/17 Follow-up type: In-patient    Andres Labrum 03/19/2017, 10:40 PM

## 2017-03-19 NOTE — Progress Notes (Signed)
POD#3 Pt without complaints. Baby with jaundice and losing wt. Not breastfeeding well. Will not be discharged VSSAF IMP/ Stable Plan/ Will likely discharge in am

## 2017-03-20 MED ORDER — OXYCODONE-ACETAMINOPHEN 5-325 MG PO TABS
2.0000 | ORAL_TABLET | ORAL | 0 refills | Status: DC | PRN
Start: 2017-03-20 — End: 2019-06-13

## 2017-03-20 NOTE — Discharge Summary (Signed)
Obstetric Discharge Summary Reason for Admission: rupture of membranes Prenatal Procedures: ultrasound Intrapartum Procedures: cesarean: low cervical, transverse Postpartum Procedures: none Complications-Operative and Postpartum: none Hemoglobin  Date Value Ref Range Status  03/16/2017 9.3 (L) 12.0 - 15.0 g/dL Final   HCT  Date Value Ref Range Status  03/16/2017 28.9 (L) 36.0 - 46.0 % Final    Physical Exam:  General: alert and cooperative Lochia: appropriate Uterine Fundus: firm Incision: healing well, no significant drainage DVT Evaluation: No evidence of DVT seen on physical exam.  Discharge Diagnoses: Term Pregnancy-delivered  Discharge Information: Date: 03/20/2017 Activity: pelvic rest Diet: routine Medications: PNV, Ibuprofen and Percocet Condition: stable Instructions: refer to practice specific booklet Discharge to: home Follow-up Information    Robin Millers, MD Follow up in 2 week(s).   Specialty:  Obstetrics and Gynecology Contact information: [place] [id number] [phone]           Newborn Data: Live born female  Birth Weight: 8 lb 13.3 oz (4005 g) APGAR: 38, 10  Newborn Delivery   Birth date/time:  03/16/2017 00:11:00 Delivery type:  C-Section, Low Transverse C-section categorization:  Primary     Home with mother.  Robin Miller 03/20/2017, 10:57 AM

## 2017-03-20 NOTE — Lactation Note (Signed)
This note was copied from a baby's chart. Lactation Consultation Note: follow up visit with mom. She reports that feedings are going better. Continues using NS and feeding tube/syringe. Baby has gained weight. Continues under phototherapy- waiting for MD to decide on DC. Reports breasts are feeling a little heavier this morning and when she pumped milk is looking a little different. Baby asleep in mom's arms at present. To call if wants assist with latch at next feeding. Reviewed engorgement prevention and treatment. Discussed OP appointments as resource to assist with latch without NS. Reviewed our phone number and [facility] as additional resources. No questions at present. To call prn  Patient Name: Robin Miller [id number] Date: 03/20/2017 Reason for consult: Follow-up assessment   Maternal Data Formula Feeding for Exclusion: No Has patient been taught Hand Expression?: Yes Does the patient have breastfeeding experience prior to this delivery?: No  Feeding Feeding Type: Breast Fed Length of feed: 20 min  LATCH Score                   Interventions    Lactation Tools Discussed/Used Tools: Nipple Shields;9F feeding tube / Syringe Nipple shield size: 24 WIC Program: No   Consult Status Consult Status: Complete    Truddie Crumble 03/20/2017, 8:40 AM

## 2017-06-15 ENCOUNTER — Ambulatory Visit: Payer: BLUE CROSS/BLUE SHIELD | Admitting: Internal Medicine

## 2017-06-15 ENCOUNTER — Encounter: Payer: Self-pay | Admitting: Internal Medicine

## 2017-06-15 VITALS — BP 102/62 | HR 73 | Temp 98.0°F | Wt 151.8 lb

## 2017-06-15 DIAGNOSIS — K5090 Crohn's disease, unspecified, without complications: Secondary | ICD-10-CM

## 2017-06-15 DIAGNOSIS — J040 Acute laryngitis: Secondary | ICD-10-CM

## 2017-06-15 DIAGNOSIS — J029 Acute pharyngitis, unspecified: Secondary | ICD-10-CM | POA: Diagnosis not present

## 2017-06-15 LAB — POCT RAPID STREP A (OFFICE): Rapid Strep A Screen: NEGATIVE

## 2017-06-15 MED ORDER — AZITHROMYCIN 250 MG PO TABS: ORAL_TABLET | ORAL | 0 refills | Status: DC

## 2017-06-15 NOTE — Progress Notes (Signed)
   Subjective:    Patient ID: Robin Miller, female    DOB: 1991-04-20, 27 y.o.   MRN: [id number]  HPI Had electronic visit with physician (not through [facility]) and was given Tamiflu.  She has a history of Crohn's disease.  She has a 57-month-old daughter and is breast-feeding.  She has been hoarse.  No documented fever or shaking chills but has cough, malaise, and fatigue.  Has had hoarse voice for several days.  Slight sore throat.     Review of Systems see above     Objective:   Physical Exam Skin warm and dry.  She is hoarse when she speaks.  Pharynx is red without exudate.  Rapid strep screen is negative.  TMs are slightly full bilaterally.  Neck is supple without adenopathy.  Chest clear to auscultation without rales or wheezing       Assessment & Plan:  Acute laryngitis  Acute non-strep pharyngitis.  Rapid strep screen is negative.  History of Crohn's disease-stable  Breast-feeding  Plan: Zithromax Z-Pak take 2 tablets day 1 followed by 1 tablet days 2 through 5.  Asked her to check with her OB/GYN regarding choice of cough syrup since she is breast-feeding.  Rest and drink plenty of fluids.  Note to be out of work until Thursday, February 21

## 2017-06-15 NOTE — Patient Instructions (Signed)
Take Zithromax Z-Pak as directed.  Rest and drink plenty of fluids.  Call OB/GYN for advice regarding cough preparation.  Note to be out of work until February 21.

## 2017-12-07 DIAGNOSIS — K5080 Crohn's disease of both small and large intestine without complications: Secondary | ICD-10-CM | POA: Diagnosis not present

## 2018-02-02 DIAGNOSIS — J069 Acute upper respiratory infection, unspecified: Secondary | ICD-10-CM | POA: Diagnosis not present

## 2018-04-28 DIAGNOSIS — J0100 Acute maxillary sinusitis, unspecified: Secondary | ICD-10-CM | POA: Diagnosis not present

## 2018-05-11 DIAGNOSIS — Z01419 Encounter for gynecological examination (general) (routine) without abnormal findings: Secondary | ICD-10-CM | POA: Diagnosis not present

## 2019-01-14 DIAGNOSIS — K5080 Crohn's disease of both small and large intestine without complications: Secondary | ICD-10-CM | POA: Diagnosis not present

## 2019-02-02 DIAGNOSIS — Z369 Encounter for antenatal screening, unspecified: Secondary | ICD-10-CM | POA: Diagnosis not present

## 2019-03-01 DIAGNOSIS — Z363 Encounter for antenatal screening for malformations: Secondary | ICD-10-CM | POA: Diagnosis not present

## 2019-03-29 DIAGNOSIS — Z23 Encounter for immunization: Secondary | ICD-10-CM | POA: Diagnosis not present

## 2019-04-20 DIAGNOSIS — Z23 Encounter for immunization: Secondary | ICD-10-CM | POA: Diagnosis not present

## 2019-04-20 DIAGNOSIS — Z3480 Encounter for supervision of other normal pregnancy, unspecified trimester: Secondary | ICD-10-CM | POA: Diagnosis not present

## 2019-05-20 DIAGNOSIS — Z369 Encounter for antenatal screening, unspecified: Secondary | ICD-10-CM | POA: Diagnosis not present

## 2019-06-01 DIAGNOSIS — Z369 Encounter for antenatal screening, unspecified: Secondary | ICD-10-CM | POA: Diagnosis not present

## 2019-06-13 ENCOUNTER — Encounter (HOSPITAL_COMMUNITY): Payer: Self-pay | Admitting: Obstetrics and Gynecology

## 2019-06-13 ENCOUNTER — Other Ambulatory Visit: Payer: Self-pay

## 2019-06-13 ENCOUNTER — Inpatient Hospital Stay (HOSPITAL_COMMUNITY)
Admission: AD | Admit: 2019-06-13 | Discharge: 2019-06-13 | Disposition: A | Discharge status: Home / Self Care | Payer: 59 | Attending: Obstetrics and Gynecology | Admitting: Obstetrics and Gynecology

## 2019-06-13 DIAGNOSIS — R000 Tachycardia, unspecified: Secondary | ICD-10-CM

## 2019-06-13 DIAGNOSIS — O26893 Other specified pregnancy related conditions, third trimester: Secondary | ICD-10-CM

## 2019-06-13 DIAGNOSIS — K219 Gastro-esophageal reflux disease without esophagitis: Secondary | ICD-10-CM | POA: Insufficient documentation

## 2019-06-13 DIAGNOSIS — Z3A35 35 weeks gestation of pregnancy: Secondary | ICD-10-CM | POA: Diagnosis not present

## 2019-06-13 DIAGNOSIS — Z3689 Encounter for other specified antenatal screening: Secondary | ICD-10-CM

## 2019-06-13 DIAGNOSIS — K5080 Crohn's disease of both small and large intestine without complications: Secondary | ICD-10-CM | POA: Diagnosis not present

## 2019-06-13 DIAGNOSIS — O99613 Diseases of the digestive system complicating pregnancy, third trimester: Secondary | ICD-10-CM | POA: Insufficient documentation

## 2019-06-13 DIAGNOSIS — Z79899 Other long term (current) drug therapy: Secondary | ICD-10-CM | POA: Diagnosis not present

## 2019-06-13 DIAGNOSIS — R519 Headache, unspecified: Secondary | ICD-10-CM | POA: Diagnosis not present

## 2019-06-13 DIAGNOSIS — Z85828 Personal history of other malignant neoplasm of skin: Secondary | ICD-10-CM | POA: Diagnosis not present

## 2019-06-13 DIAGNOSIS — Z3483 Encounter for supervision of other normal pregnancy, third trimester: Secondary | ICD-10-CM | POA: Diagnosis not present

## 2019-06-13 DIAGNOSIS — Z3482 Encounter for supervision of other normal pregnancy, second trimester: Secondary | ICD-10-CM | POA: Diagnosis not present

## 2019-06-13 LAB — CBC
HCT: 32.5 % — ABNORMAL LOW (ref 36.0–46.0)
Hemoglobin: 10.3 g/dL — ABNORMAL LOW (ref 12.0–15.0)
MCH: 28.1 pg (ref 26.0–34.0)
MCHC: 31.7 g/dL (ref 30.0–36.0)
MCV: 88.8 fL (ref 80.0–100.0)
Platelets: 282 10*3/uL (ref 150–400)
RBC: 3.66 MIL/uL — ABNORMAL LOW (ref 3.87–5.11)
RDW: 13.9 % (ref 11.5–15.5)
WBC: 8 10*3/uL (ref 4.0–10.5)
nRBC: 0 % (ref 0.0–0.2)

## 2019-06-13 LAB — URINALYSIS, ROUTINE W REFLEX MICROSCOPIC
Bilirubin Urine: NEGATIVE
Glucose, UA: NEGATIVE mg/dL
Hgb urine dipstick: NEGATIVE
Ketones, ur: NEGATIVE mg/dL
Leukocytes,Ua: NEGATIVE
Nitrite: NEGATIVE
Protein, ur: NEGATIVE mg/dL
Specific Gravity, Urine: 1.016 (ref 1.005–1.030)
pH: 7 (ref 5.0–8.0)

## 2019-06-13 MED ORDER — DEXAMETHASONE SODIUM PHOSPHATE 10 MG/ML IJ SOLN
10.0000 mg | Freq: Once | INTRAMUSCULAR | Status: AC
  Administered 2019-06-13: 10 mg via INTRAVENOUS
  Filled 2019-06-13: qty 1

## 2019-06-13 MED ORDER — BUTALBITAL-APAP-CAFFEINE 50-325-40 MG PO TABS
2.0000 | ORAL_TABLET | Freq: Four times a day (QID) | ORAL | Status: DC | PRN
  Administered 2019-06-13: 18:00:00 2 via ORAL
  Filled 2019-06-13: qty 2

## 2019-06-13 MED ORDER — LACTATED RINGERS IV BOLUS
1000.0000 mL | Freq: Once | INTRAVENOUS | Status: AC
  Administered 2019-06-13: 1000 mL via INTRAVENOUS

## 2019-06-13 MED ORDER — METOCLOPRAMIDE HCL 5 MG/ML IJ SOLN
10.0000 mg | Freq: Once | INTRAMUSCULAR | Status: AC
  Administered 2019-06-13: 10 mg via INTRAVENOUS
  Filled 2019-06-13: qty 2

## 2019-06-13 MED ORDER — DIPHENHYDRAMINE HCL 50 MG/ML IJ SOLN
25.0000 mg | Freq: Once | INTRAMUSCULAR | Status: AC
  Administered 2019-06-13: 25 mg via INTRAVENOUS
  Filled 2019-06-13: qty 1

## 2019-06-13 NOTE — MAU Note (Deleted)
Report given to [facility] in Short stay, advised that transport would be coming to take pt to main OR. Consent was signed with Dr. Roselie Awkward.

## 2019-06-13 NOTE — MAU Provider Note (Addendum)
History     CSN: [id number]  Arrival date and time: 06/13/19 1643   First Provider Initiated Contact with Patient 06/13/19 1723      Chief Complaint  Patient presents with  . Chest Pain  . Headache  . Nausea  . Emesis   29 y.o. G2P1001 @35 .0 wks presenting with fast heart rate, chest pressure, and HA. Sx started today. Fast heart rate is intemittent and chest pressure occurs simultaneously lasting for a few seconds to few minutes. Denies SOB. She tried increasing her fluid intake but it didn't help. She is eating and drinking well although had 2 episodes of emesis which has been an ongoing problem throughout her pregnancy. Denies fever or cough. HA is frontal and occipital. She tried Tylenol which helped initially then the HA returned. Denies visual disturbances and epigastric pain. Denies VB, LOF, and ctx. +FM.   OB History    Gravida  2   Para  1   Term  1   Preterm      AB      Living  1     SAB      TAB      Ectopic      Multiple  0   Live Births  1           Past Medical History:  Diagnosis Date  . Anemia   . Cancer ([place])    basal cell back and face  . 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 Depression   . Erosive esophagitis    [place] Class B  . GERD (gastroesophageal reflux disease)   . Nausea vomiting and diarrhea   . Vasovagal syncope     Past Surgical History:  Procedure Laterality Date  . CESAREAN SECTION N/A 03/15/2017   Procedure: CESAREAN SECTION;  Surgeon: [name], MD;  Location: [place];  Service: Obstetrics;  Laterality: N/A;  . CHOLECYSTECTOMY N/A 03/09/2013   Procedure: LAPAROSCOPIC CHOLECYSTECTOMY;  Surgeon: Gayland Curry, MD;  [facility];  Service: General;  Laterality: N/A;  . COLONOSCOPY N/A 03/07/2013   Procedure: COLONOSCOPY;  Surgeon: Gatha Mayer, MD;  [facility];  Service: Endoscopy;   Laterality: N/A;  . COLONOSCOPY    . CYSTOSCOPY WITH RETROGRADE PYELOGRAM, URETEROSCOPY AND STENT PLACEMENT  05/27/2012   Procedure: CYSTOSCOPY WITH RETROGRADE PYELOGRAM, URETEROSCOPY AND STENT PLACEMENT;  Surgeon: Dutch Gray, MD;  [facility];  Service: Urology;  Laterality: Right;  . ESOPHAGOGASTRODUODENOSCOPY N/A 03/04/2013   Procedure: ESOPHAGOGASTRODUODENOSCOPY (EGD);  Surgeon: Jerene Bears, MD;  Location[facility];  Service: Endoscopy;  Laterality: N/A;  . HOLMIUM LASER APPLICATION  0000000   Procedure: HOLMIUM LASER APPLICATION;  Surgeon: Dutch Gray, MD;  [facility];  Service: Urology;  Laterality: Right;     . ileocetomy  03/16/2012  . LAPAROSCOPIC ILEOCECECTOMY  03/16/2012   Procedure: LAPAROSCOPIC ILEOCECECTOMY;  Surgeon: Adin Hector, MD;  Location: [place];  Service: General;  Laterality: N/A;  lap assisted ileocectomy  . SKIN CANCER EXCISION    . TONSILLECTOMY AND ADENOIDECTOMY    . UPPER GASTROINTESTINAL ENDOSCOPY      Family History  Problem Relation Age of Onset  . Stroke Mother   . Breast cancer Mother   . Crohn's disease Father   . Breast cancer Paternal Aunt   . Colitis Maternal Aunt   . Colon cancer Paternal Grandmother        ?  cancer, part of colon removed  . Stroke Paternal Grandmother   . Esophageal cancer Neg Hx   . Pancreatic cancer Neg Hx   . Rectal cancer Neg Hx   . Stomach cancer Neg Hx     Social History   Tobacco Use  . Smoking status: Never Smoker  . Smokeless tobacco: Never Used  Substance Use Topics  . Alcohol use: No  . Drug use: No    Allergies:  Allergies  Allergen Reactions  . Prednisone     Blurred vision, ringing in ears, insomnia, shaky, body convulsions,nausea    Medications Prior to Admission  Medication Sig Dispense Refill Last Dose  . ondansetron (ZOFRAN) 8 MG tablet Take 8 mg every 8 (eight) hours as needed by mouth for nausea or vomiting.   3 06/12/2019 at Unknown time  . Probiotic Product (PROBIOTIC  PO) Take 1 capsule daily by mouth.    Past Week at Unknown time  . albuterol (PROVENTIL HFA;VENTOLIN HFA) 108 (90 Base) MCG/ACT inhaler Inhale 2 puffs into the lungs every 6 (six) hours as needed for wheezing or shortness of breath. 1 Inhaler 2   . azithromycin (ZITHROMAX) 250 MG tablet 2 po day 1 followed by one po days 2-5 6 tablet 0   . mesalamine (PENTASA) 500 MG CR capsule Take 1,500 mg 2 (two) times daily by mouth.      . Multiple Vitamin (MULTIVITAMIN WITH MINERALS) TABS tablet Take 1 tablet by mouth daily.     Marland Kitchen oxyCODONE-acetaminophen (PERCOCET/ROXICET) 5-325 MG tablet Take 2 tablets by mouth every 4 (four) hours as needed (pain scale > 7). 30 tablet 0     Review of Systems  Eyes: Negative for visual disturbance.  Cardiovascular: Positive for chest pain (pressure).  Gastrointestinal: Negative for abdominal pain.  Genitourinary: Negative for vaginal bleeding and vaginal discharge.  Neurological: Positive for headaches.   Physical Exam   Blood pressure 129/83, pulse (!) 107, temperature 98 F (36.7 C), temperature source Oral, resp. rate 18, SpO2 98 %, unknown if currently breastfeeding. Patient Vitals for the past 24 hrs:  BP Temp Temp src Pulse Resp SpO2  06/13/19 2042 133/83 -- -- (!) 103 -- --  06/13/19 2015 -- -- -- -- -- 98 %  06/13/19 1730 -- -- -- -- -- 98 %  06/13/19 1725 -- -- -- -- -- 99 %  06/13/19 1720 -- -- -- -- -- 99 %  06/13/19 1709 129/83 98 F (36.7 C) Oral (!) 107 18 --   Physical Exam  Nursing note and vitals reviewed. Constitutional: She is oriented to person, place, and time. She appears well-developed and well-nourished. No distress.  HENT:  Head: Normocephalic and atraumatic.  Cardiovascular: Regular rhythm and normal heart sounds.  Mild tachy  Respiratory: Effort normal and breath sounds normal. No respiratory distress. She has no wheezes. She has no rales.  Musculoskeletal:        General: Normal range of motion.     Cervical back: Normal range  of motion.  Neurological: She is alert and oriented to person, place, and time.  Skin: Skin is warm and dry.  Psychiatric: She has a normal mood and affect.  EFM: 135 bpm, mod variability, + accels, no decels Toco: rare  Results for orders placed or performed during the hospital encounter of 06/13/19 (from the past 24 hour(s))  Urinalysis, Routine w reflex microscopic     Status: Abnormal   Collection Time: 06/13/19  5:00 PM  Result Value Ref Range  Color, Urine YELLOW YELLOW   APPearance HAZY (A) CLEAR   Specific Gravity, Urine 1.016 1.005 - 1.030   pH 7.0 5.0 - 8.0   Glucose, UA NEGATIVE NEGATIVE mg/dL   Hgb urine dipstick NEGATIVE NEGATIVE   Bilirubin Urine NEGATIVE NEGATIVE   Ketones, ur NEGATIVE NEGATIVE mg/dL   Protein, ur NEGATIVE NEGATIVE mg/dL   Nitrite NEGATIVE NEGATIVE   Leukocytes,Ua NEGATIVE NEGATIVE  CBC     Status: Abnormal   Collection Time: 06/13/19  5:55 PM  Result Value Ref Range   WBC 8.0 4.0 - 10.5 K/uL   RBC 3.66 (L) 3.87 - 5.11 MIL/uL   Hemoglobin 10.3 (L) 12.0 - 15.0 g/dL   HCT 32.5 (L) 36.0 - 46.0 %   MCV 88.8 80.0 - 100.0 fL   MCH 28.1 26.0 - 34.0 pg   MCHC 31.7 30.0 - 36.0 g/dL   RDW 13.9 11.5 - 15.5 %   Platelets 282 150 - 400 K/uL   nRBC 0.0 0.0 - 0.2 %   MAU Course  Procedures Meds ordered this encounter  Medications  . lactated ringers bolus 1,000 mL  . butalbital-acetaminophen-caffeine (FIORICET) 50-325-40 MG per tablet 2 tablet  . metoCLOPramide (REGLAN) injection 10 mg  . diphenhydrAMINE (BENADRYL) injection 25 mg  . dexamethasone (DECADRON) injection 10 mg   MDM Labs and EKG ordered. Consult with Dr. Irish Lack- EKG reviewed and normal sinus tach. No relief from HA after meds, will order HA cocktail. Transfer of care given to [place], [place].  Julianne Handler, CNM  06/13/2019 8:04 PM   --At bedside at 2040. Patient states headache has resolved. She denies pain or discomfort and is eating.   Assessment and Plan  --29 y.o. G2P1001  at [redacted]w[redacted]d  --Reactive tracing --S/p normal EKG --Headache in pregnancy, resolved --Discharge home in stable condition  Mallie Snooks, MSN, CNM Certified Nurse Midwife, [employer] for [employer], [place] 06/13/19 9:06 PM

## 2019-06-13 NOTE — MAU Note (Signed)
Pt presents to MAU with complaints of chest pressure and she has felt like heart was racing. She also had headache today, and has ongoing nausea and vomiting. Pt denies VB and LOF. +FM

## 2019-06-13 NOTE — Discharge Instructions (Signed)
Pregnancy and Anemia ° °Anemia is a condition in which the concentration of red blood cells, or hemoglobin, in the blood is below normal. Hemoglobin is a substance in red blood cells that carries oxygen to the tissues of the body. Anemia results when enough oxygen does not reach these tissues. °Anemia is common during pregnancy because the woman's body needs more blood volume and blood cells to provide nutrition to the fetus. The fetus needs iron and folic acid as it is developing. Your body may not produce enough red blood cells because of this. Also, during pregnancy, the liquid part of the blood (plasma) increases by about 30-50%, and the red blood cells increase by only 20%. This lowers the concentration of the red blood cells and creates a natural anemia-like situation. °What are the causes? °The most common cause of anemia during pregnancy is not having enough iron in the body to make red blood cells (iron deficiency anemia). Other causes may include: °· Folic acid deficiency. °· Vitamin B12 deficiency. °· Certain prescription or over-the-counter medicines. °· Certain medical conditions or infections that destroy red blood cells. °· A low platelet count and bleeding caused by antibodies that go through the placenta to the fetus from the mother’s blood. °What are the signs or symptoms? °Mild anemia may not be noticeable. If it becomes severe, symptoms may include: °· Feeling tired (fatigue). °· Shortness of breath, especially during activity. °· Weakness. °· Fainting. °· Pale looking skin. °· Headaches. °· A fast or irregular heartbeat (palpitations). °· Dizziness. °How is this diagnosed? °This condition may be diagnosed based on: °· Your medical history and a physical exam. °· Blood tests. °How is this treated? °Treatment for anemia during pregnancy depends on the cause of the anemia. Treatment can include: °· Dietary changes. °· Supplements of iron, vitamin B12, or folic acid. °· A blood transfusion. This may  be needed if anemia is severe. °· Hospitalization. This may be needed if there is a lot of blood loss or severe anemia. °Follow these instructions at home: °· Follow recommendations from your dietitian or health care provider about changing your diet. °· Increase your vitamin C intake. This will help the stomach absorb more iron. Some foods that are high in vitamin C include: °? Oranges. °? Peppers. °? Tomatoes. °? Mangoes. °· Eat a diet rich in iron. This would include foods such as: °? Liver. °? Beef. °? Eggs. °? Whole grains. °? Spinach. °? Dried fruit. °· Take iron and vitamins as told by your health care provider. °· Eat green leafy vegetables. These are a good source of folic acid. °· Keep all follow-up visits as told by your health care provider. This is important. °Contact a health care provider if: °· You have frequent or lasting headaches. °· You look pale. °· You bruise easily. °Get help right away if: °· You have extreme weakness, shortness of breath, or chest pain. °· You become dizzy or have trouble concentrating. °· You have heavy vaginal bleeding. °· You develop a rash. °· You have bloody or black, tarry stools. °· You faint. °· You vomit up blood. °· You vomit repeatedly. °· You have abdominal pain. °· You have a fever. °· You are dehydrated. °Summary °· Anemia is a condition in which the concentration of red blood cells or hemoglobin in the blood is below normal. °· Anemia is common during pregnancy because the woman's body needs more blood volume and blood cells to provide nutrition to the fetus. °· The most   common cause of anemia during pregnancy is not having enough iron in the body to make red blood cells (iron deficiency anemia). °· Mild anemia may not be noticeable. If it becomes severe, symptoms may include feeling tired and weak. °This information is not intended to replace advice given to you by your health care provider. Make sure you discuss any questions you have with your health care  provider. °Document Revised: 09/28/2018 Document Reviewed: 05/20/2016 °Elsevier Patient Education © 2020 Elsevier Inc. ° °

## 2019-06-15 DIAGNOSIS — Z369 Encounter for antenatal screening, unspecified: Secondary | ICD-10-CM | POA: Diagnosis not present

## 2019-06-17 DIAGNOSIS — M5489 Other dorsalgia: Secondary | ICD-10-CM | POA: Diagnosis not present

## 2019-06-22 ENCOUNTER — Other Ambulatory Visit: Payer: Self-pay | Admitting: Obstetrics and Gynecology

## 2019-06-22 DIAGNOSIS — Z3480 Encounter for supervision of other normal pregnancy, unspecified trimester: Secondary | ICD-10-CM | POA: Diagnosis not present

## 2019-06-22 DIAGNOSIS — Z9889 Other specified postprocedural states: Secondary | ICD-10-CM | POA: Diagnosis not present

## 2019-06-22 DIAGNOSIS — Z362 Encounter for other antenatal screening follow-up: Secondary | ICD-10-CM | POA: Diagnosis not present

## 2019-06-30 ENCOUNTER — Encounter (HOSPITAL_COMMUNITY): Payer: Self-pay

## 2019-06-30 NOTE — Patient Instructions (Signed)
Robin Miller  06/30/2019   Your procedure is scheduled on:  07/12/2019  Arrive at 1000 at Entrance C on [address] at [facility]  and [address]. You are invited to use the FREE valet parking or use the Visitor's parking deck.  Pick up the phone at the desk and dial [phone].  Call this number if you have problems the morning of surgery: [phone]  Remember:   Do not eat food:(After Midnight) Desps de medianoche.  Do not drink clear liquids: (After Midnight) Desps de medianoche.  Take these medicines the morning of surgery with A SIP OF WATER:  nexium and mesalamine   Do not wear jewelry, make-up or nail polish.  Do not wear lotions, powders, or perfumes. Do not wear deodorant.  Do not shave 48 hours prior to surgery.  Do not bring valuables to the hospital.  [facility] is not   responsible for any belongings or valuables brought to the hospital.  Contacts, dentures or bridgework may not be worn into surgery.  Leave suitcase in the car. After surgery it may be brought to your room.  For patients admitted to the hospital, checkout time is 11:00 AM the day of              discharge.      Please read over the following fact sheets that you were given:     Preparing for Surgery

## 2019-07-01 DIAGNOSIS — Z369 Encounter for antenatal screening, unspecified: Secondary | ICD-10-CM | POA: Diagnosis not present

## 2019-07-06 DIAGNOSIS — Z369 Encounter for antenatal screening, unspecified: Secondary | ICD-10-CM | POA: Diagnosis not present

## 2019-07-10 ENCOUNTER — Other Ambulatory Visit (HOSPITAL_COMMUNITY)
Admission: RE | Admit: 2019-07-10 | Discharge: 2019-07-10 | Disposition: A | Discharge status: Home / Self Care | Payer: 59 | Source: Ambulatory Visit | Attending: Obstetrics and Gynecology | Admitting: Obstetrics and Gynecology

## 2019-07-10 ENCOUNTER — Other Ambulatory Visit: Payer: Self-pay

## 2019-07-10 DIAGNOSIS — K5080 Crohn's disease of both small and large intestine without complications: Secondary | ICD-10-CM | POA: Diagnosis not present

## 2019-07-10 DIAGNOSIS — O99344 Other mental disorders complicating childbirth: Secondary | ICD-10-CM | POA: Diagnosis present

## 2019-07-10 DIAGNOSIS — O34211 Maternal care for low transverse scar from previous cesarean delivery: Secondary | ICD-10-CM | POA: Diagnosis not present

## 2019-07-10 DIAGNOSIS — O99214 Obesity complicating childbirth: Secondary | ICD-10-CM | POA: Diagnosis not present

## 2019-07-10 DIAGNOSIS — O9962 Diseases of the digestive system complicating childbirth: Secondary | ICD-10-CM | POA: Diagnosis not present

## 2019-07-10 DIAGNOSIS — Z20822 Contact with and (suspected) exposure to covid-19: Secondary | ICD-10-CM | POA: Diagnosis not present

## 2019-07-10 DIAGNOSIS — R69 Illness, unspecified: Secondary | ICD-10-CM | POA: Diagnosis not present

## 2019-07-10 DIAGNOSIS — D649 Anemia, unspecified: Secondary | ICD-10-CM | POA: Diagnosis not present

## 2019-07-10 DIAGNOSIS — F329 Major depressive disorder, single episode, unspecified: Secondary | ICD-10-CM | POA: Diagnosis present

## 2019-07-10 DIAGNOSIS — Z3A00 Weeks of gestation of pregnancy not specified: Secondary | ICD-10-CM | POA: Diagnosis not present

## 2019-07-10 DIAGNOSIS — O9902 Anemia complicating childbirth: Secondary | ICD-10-CM | POA: Diagnosis not present

## 2019-07-10 DIAGNOSIS — Z3A39 39 weeks gestation of pregnancy: Secondary | ICD-10-CM | POA: Diagnosis not present

## 2019-07-10 DIAGNOSIS — E669 Obesity, unspecified: Secondary | ICD-10-CM | POA: Diagnosis not present

## 2019-07-10 LAB — CBC
HCT: 33.8 % — ABNORMAL LOW (ref 36.0–46.0)
Hemoglobin: 10.8 g/dL — ABNORMAL LOW (ref 12.0–15.0)
MCH: 27.3 pg (ref 26.0–34.0)
MCHC: 32 g/dL (ref 30.0–36.0)
MCV: 85.4 fL (ref 80.0–100.0)
Platelets: 270 10*3/uL (ref 150–400)
RBC: 3.96 MIL/uL (ref 3.87–5.11)
RDW: 14.7 % (ref 11.5–15.5)
WBC: 6.9 10*3/uL (ref 4.0–10.5)
nRBC: 0 % (ref 0.0–0.2)

## 2019-07-10 LAB — TYPE AND SCREEN
ABO/RH(D): O POS
Antibody Screen: NEGATIVE

## 2019-07-10 LAB — SARS CORONAVIRUS 2 (TAT 6-24 HRS): SARS Coronavirus 2: NEGATIVE

## 2019-07-10 NOTE — MAU Note (Signed)
Pt here for PAT covid swab. Denies symptoms. Swab collected.  

## 2019-07-11 ENCOUNTER — Encounter (HOSPITAL_COMMUNITY): Payer: Self-pay | Admitting: Obstetrics and Gynecology

## 2019-07-11 LAB — RPR: RPR Ser Ql: NONREACTIVE

## 2019-07-12 ENCOUNTER — Inpatient Hospital Stay (HOSPITAL_COMMUNITY): Payer: 59

## 2019-07-12 ENCOUNTER — Other Ambulatory Visit: Payer: Self-pay

## 2019-07-12 ENCOUNTER — Encounter (HOSPITAL_COMMUNITY)
Admission: RE | Disposition: A | Discharge status: Home / Self Care | Payer: Self-pay | Source: Home / Self Care | Attending: Obstetrics and Gynecology

## 2019-07-12 ENCOUNTER — Encounter (HOSPITAL_COMMUNITY): Payer: Self-pay | Admitting: Obstetrics and Gynecology

## 2019-07-12 ENCOUNTER — Inpatient Hospital Stay (HOSPITAL_COMMUNITY)
Admission: RE | Admit: 2019-07-12 | Discharge: 2019-07-14 | DRG: 787 | Disposition: A | Discharge status: Home / Self Care | Payer: 59 | Attending: Obstetrics and Gynecology | Admitting: Obstetrics and Gynecology

## 2019-07-12 DIAGNOSIS — O9902 Anemia complicating childbirth: Secondary | ICD-10-CM | POA: Diagnosis present

## 2019-07-12 DIAGNOSIS — F329 Major depressive disorder, single episode, unspecified: Secondary | ICD-10-CM | POA: Diagnosis present

## 2019-07-12 DIAGNOSIS — K5080 Crohn's disease of both small and large intestine without complications: Secondary | ICD-10-CM | POA: Diagnosis present

## 2019-07-12 DIAGNOSIS — Z20822 Contact with and (suspected) exposure to covid-19: Secondary | ICD-10-CM | POA: Diagnosis present

## 2019-07-12 DIAGNOSIS — O99344 Other mental disorders complicating childbirth: Secondary | ICD-10-CM | POA: Diagnosis present

## 2019-07-12 DIAGNOSIS — O99214 Obesity complicating childbirth: Secondary | ICD-10-CM | POA: Diagnosis present

## 2019-07-12 DIAGNOSIS — O34211 Maternal care for low transverse scar from previous cesarean delivery: Principal | ICD-10-CM | POA: Diagnosis present

## 2019-07-12 DIAGNOSIS — Z3A39 39 weeks gestation of pregnancy: Secondary | ICD-10-CM | POA: Diagnosis not present

## 2019-07-12 DIAGNOSIS — E669 Obesity, unspecified: Secondary | ICD-10-CM | POA: Diagnosis present

## 2019-07-12 DIAGNOSIS — O9962 Diseases of the digestive system complicating childbirth: Secondary | ICD-10-CM | POA: Diagnosis present

## 2019-07-12 DIAGNOSIS — D649 Anemia, unspecified: Secondary | ICD-10-CM | POA: Diagnosis present

## 2019-07-12 SURGERY — Surgical Case
Anesthesia: Spinal | Wound class: Clean Contaminated

## 2019-07-12 MED ORDER — PHENYLEPHRINE HCL-NACL 20-0.9 MG/250ML-% IV SOLN
INTRAVENOUS | Status: DC | PRN
  Administered 2019-07-12: 60 ug/min via INTRAVENOUS

## 2019-07-12 MED ORDER — PHENYLEPHRINE 40 MCG/ML (10ML) SYRINGE FOR IV PUSH (FOR BLOOD PRESSURE SUPPORT)
PREFILLED_SYRINGE | INTRAVENOUS | Status: AC
  Filled 2019-07-12: qty 10

## 2019-07-12 MED ORDER — TETANUS-DIPHTH-ACELL PERTUSSIS 5-2.5-18.5 LF-MCG/0.5 IM SUSP: 0.5000 mL | Freq: Once | INTRAMUSCULAR | Status: DC

## 2019-07-12 MED ORDER — PHENYLEPHRINE HCL-NACL 20-0.9 MG/250ML-% IV SOLN
INTRAVENOUS | Status: AC
  Filled 2019-07-12: qty 250

## 2019-07-12 MED ORDER — ONDANSETRON HCL 4 MG/2ML IJ SOLN
INTRAMUSCULAR | Status: AC
  Filled 2019-07-12: qty 2

## 2019-07-12 MED ORDER — LIDOCAINE HCL 1 % IJ SOLN
INTRAMUSCULAR | Status: AC
  Filled 2019-07-12: qty 20

## 2019-07-12 MED ORDER — LACTATED RINGERS IV SOLN: INTRAVENOUS | Status: DC

## 2019-07-12 MED ORDER — SIMETHICONE 80 MG PO CHEW: 80.0000 mg | CHEWABLE_TABLET | ORAL | Status: DC | PRN

## 2019-07-12 MED ORDER — FENTANYL CITRATE (PF) 100 MCG/2ML IJ SOLN
INTRAMUSCULAR | Status: AC
  Filled 2019-07-12: qty 2

## 2019-07-12 MED ORDER — MEPERIDINE HCL 25 MG/ML IJ SOLN: 6.2500 mg | INTRAMUSCULAR | Status: DC | PRN

## 2019-07-12 MED ORDER — DIPHENHYDRAMINE HCL 25 MG PO CAPS: 25.0000 mg | ORAL_CAPSULE | Freq: Four times a day (QID) | ORAL | Status: DC | PRN

## 2019-07-12 MED ORDER — STERILE WATER FOR IRRIGATION IR SOLN
Status: DC | PRN
  Administered 2019-07-12: 1000 mL

## 2019-07-12 MED ORDER — OXYTOCIN 40 UNITS IN NORMAL SALINE INFUSION - SIMPLE MED: 2.5000 [IU]/h | INTRAVENOUS | Status: AC

## 2019-07-12 MED ORDER — ACETAMINOPHEN 160 MG/5ML PO SOLN: 325.0000 mg | ORAL | Status: DC | PRN

## 2019-07-12 MED ORDER — OXYTOCIN 40 UNITS IN NORMAL SALINE INFUSION - SIMPLE MED
INTRAVENOUS | Status: AC
  Filled 2019-07-12: qty 1000

## 2019-07-12 MED ORDER — CEFAZOLIN SODIUM-DEXTROSE 2-4 GM/100ML-% IV SOLN
2.0000 g | INTRAVENOUS | Status: AC
  Administered 2019-07-12: 2 g via INTRAVENOUS

## 2019-07-12 MED ORDER — CEFAZOLIN SODIUM-DEXTROSE 2-4 GM/100ML-% IV SOLN
INTRAVENOUS | Status: AC
  Filled 2019-07-12: qty 100

## 2019-07-12 MED ORDER — MORPHINE SULFATE (PF) 0.5 MG/ML IJ SOLN
INTRAMUSCULAR | Status: DC | PRN
  Administered 2019-07-12: .15 mg via INTRATHECAL

## 2019-07-12 MED ORDER — OXYCODONE-ACETAMINOPHEN 5-325 MG PO TABS
1.0000 | ORAL_TABLET | ORAL | Status: DC | PRN
  Administered 2019-07-13 (×3): 2 via ORAL
  Administered 2019-07-13: 1 via ORAL
  Administered 2019-07-13 – 2019-07-14 (×3): 2 via ORAL
  Filled 2019-07-12 (×3): qty 2
  Filled 2019-07-12: qty 1
  Filled 2019-07-12 (×3): qty 2

## 2019-07-12 MED ORDER — ONDANSETRON HCL 4 MG/2ML IJ SOLN
INTRAMUSCULAR | Status: DC | PRN
  Administered 2019-07-12: 4 mg via INTRAVENOUS

## 2019-07-12 MED ORDER — DIBUCAINE (PERIANAL) 1 % EX OINT: 1.0000 "application " | TOPICAL_OINTMENT | CUTANEOUS | Status: DC | PRN

## 2019-07-12 MED ORDER — SODIUM CHLORIDE 0.9 % IV SOLN: INTRAVENOUS | Status: DC | PRN

## 2019-07-12 MED ORDER — ACETAMINOPHEN 325 MG PO TABS
650.0000 mg | ORAL_TABLET | ORAL | Status: DC | PRN
  Administered 2019-07-12 (×2): 650 mg via ORAL
  Filled 2019-07-12 (×2): qty 2

## 2019-07-12 MED ORDER — IBUPROFEN 800 MG PO TABS
800.0000 mg | ORAL_TABLET | Freq: Three times a day (TID) | ORAL | Status: DC
  Administered 2019-07-12 – 2019-07-14 (×5): 800 mg via ORAL
  Filled 2019-07-12 (×5): qty 1

## 2019-07-12 MED ORDER — OXYCODONE HCL 5 MG PO TABS: 5.0000 mg | ORAL_TABLET | Freq: Once | ORAL | Status: DC | PRN

## 2019-07-12 MED ORDER — OXYTOCIN 40 UNITS IN NORMAL SALINE INFUSION - SIMPLE MED
INTRAVENOUS | Status: DC | PRN
  Administered 2019-07-12: 40 [IU] via INTRAVENOUS

## 2019-07-12 MED ORDER — FENTANYL CITRATE (PF) 100 MCG/2ML IJ SOLN
INTRAMUSCULAR | Status: DC | PRN
  Administered 2019-07-12: 15 ug via INTRATHECAL

## 2019-07-12 MED ORDER — ONDANSETRON HCL 4 MG/2ML IJ SOLN: 4.0000 mg | Freq: Once | INTRAMUSCULAR | Status: DC | PRN

## 2019-07-12 MED ORDER — SIMETHICONE 80 MG PO CHEW
80.0000 mg | CHEWABLE_TABLET | Freq: Three times a day (TID) | ORAL | Status: DC
  Administered 2019-07-12 – 2019-07-14 (×5): 80 mg via ORAL
  Filled 2019-07-12 (×5): qty 1

## 2019-07-12 MED ORDER — SIMETHICONE 80 MG PO CHEW
80.0000 mg | CHEWABLE_TABLET | ORAL | Status: DC
  Administered 2019-07-13 (×2): 80 mg via ORAL
  Filled 2019-07-12 (×2): qty 1

## 2019-07-12 MED ORDER — PRENATAL MULTIVITAMIN CH
1.0000 | ORAL_TABLET | Freq: Every day | ORAL | Status: DC
  Administered 2019-07-13 – 2019-07-14 (×2): 1 via ORAL
  Filled 2019-07-12 (×2): qty 1

## 2019-07-12 MED ORDER — MENTHOL 3 MG MT LOZG: 1.0000 | LOZENGE | OROMUCOSAL | Status: DC | PRN

## 2019-07-12 MED ORDER — KETOROLAC TROMETHAMINE 60 MG/2ML IM SOLN
30.0000 mg | Freq: Once | INTRAMUSCULAR | Status: AC
  Administered 2019-07-12: 30 mg via INTRAMUSCULAR

## 2019-07-12 MED ORDER — DEXAMETHASONE SODIUM PHOSPHATE 10 MG/ML IJ SOLN
INTRAMUSCULAR | Status: DC | PRN
  Administered 2019-07-12: 10 mg via INTRAVENOUS

## 2019-07-12 MED ORDER — OXYCODONE HCL 5 MG/5ML PO SOLN: 5.0000 mg | Freq: Once | ORAL | Status: DC | PRN

## 2019-07-12 MED ORDER — SENNOSIDES-DOCUSATE SODIUM 8.6-50 MG PO TABS
2.0000 | ORAL_TABLET | ORAL | Status: DC
  Administered 2019-07-12 – 2019-07-13 (×2): 2 via ORAL
  Filled 2019-07-12 (×3): qty 2

## 2019-07-12 MED ORDER — LACTATED RINGERS IV SOLN: INTRAVENOUS | Status: DC | PRN

## 2019-07-12 MED ORDER — KETOROLAC TROMETHAMINE 30 MG/ML IJ SOLN
INTRAMUSCULAR | Status: AC
  Filled 2019-07-12: qty 1

## 2019-07-12 MED ORDER — FENTANYL CITRATE (PF) 100 MCG/2ML IJ SOLN: 25.0000 ug | INTRAMUSCULAR | Status: DC | PRN

## 2019-07-12 MED ORDER — LIDOCAINE 2% (20 MG/ML) 5 ML SYRINGE
INTRAMUSCULAR | Status: AC
  Filled 2019-07-12: qty 5

## 2019-07-12 MED ORDER — DEXAMETHASONE SODIUM PHOSPHATE 10 MG/ML IJ SOLN
INTRAMUSCULAR | Status: AC
  Filled 2019-07-12: qty 1

## 2019-07-12 MED ORDER — ACETAMINOPHEN 325 MG PO TABS: 325.0000 mg | ORAL_TABLET | ORAL | Status: DC | PRN

## 2019-07-12 MED ORDER — SERTRALINE HCL 50 MG PO TABS
50.0000 mg | ORAL_TABLET | Freq: Every day | ORAL | Status: DC
  Administered 2019-07-12 – 2019-07-13 (×2): 50 mg via ORAL
  Filled 2019-07-12 (×2): qty 1

## 2019-07-12 MED ORDER — WITCH HAZEL-GLYCERIN EX PADS: 1.0000 "application " | MEDICATED_PAD | CUTANEOUS | Status: DC | PRN

## 2019-07-12 MED ORDER — PHENYLEPHRINE 40 MCG/ML (10ML) SYRINGE FOR IV PUSH (FOR BLOOD PRESSURE SUPPORT)
PREFILLED_SYRINGE | INTRAVENOUS | Status: DC | PRN
  Administered 2019-07-12 (×2): 80 ug via INTRAVENOUS
  Administered 2019-07-12: 40 ug via INTRAVENOUS
  Administered 2019-07-12: 160 ug via INTRAVENOUS

## 2019-07-12 MED ORDER — ZOLPIDEM TARTRATE 5 MG PO TABS: 5.0000 mg | ORAL_TABLET | Freq: Every evening | ORAL | Status: DC | PRN

## 2019-07-12 MED ORDER — BUPIVACAINE IN DEXTROSE 0.75-8.25 % IT SOLN
INTRATHECAL | Status: DC | PRN
  Administered 2019-07-12: 1.5 mL via INTRATHECAL

## 2019-07-12 MED ORDER — SODIUM CHLORIDE 0.9 % IR SOLN
Status: DC | PRN
  Administered 2019-07-12: 1

## 2019-07-12 MED ORDER — COCONUT OIL OIL: 1.0000 "application " | TOPICAL_OIL | Status: DC | PRN

## 2019-07-12 MED ORDER — POTASSIUM CHLORIDE 2 MEQ/ML IV SOLN
INTRAVENOUS | Status: DC
  Filled 2019-07-12: qty 1000

## 2019-07-12 MED ORDER — MORPHINE SULFATE (PF) 0.5 MG/ML IJ SOLN
INTRAMUSCULAR | Status: AC
  Filled 2019-07-12: qty 10

## 2019-07-12 SURGICAL SUPPLY — 34 items
BENZOIN TINCTURE PRP APPL 2/3 (GAUZE/BANDAGES/DRESSINGS) ×1 IMPLANT
CHLORAPREP W/TINT 26ML (MISCELLANEOUS) ×2 IMPLANT
CLAMP CORD UMBIL (MISCELLANEOUS) IMPLANT
CLOTH BEACON ORANGE TIMEOUT ST (SAFETY) ×2 IMPLANT
CLSR STERI-STRIP ANTIMIC 1/2X4 (GAUZE/BANDAGES/DRESSINGS) ×1 IMPLANT
DRSG OPSITE POSTOP 4X10 (GAUZE/BANDAGES/DRESSINGS) ×2 IMPLANT
ELECT REM PT RETURN 9FT ADLT (ELECTROSURGICAL) ×2
ELECTRODE REM PT RTRN 9FT ADLT (ELECTROSURGICAL) ×1 IMPLANT
EXTRACTOR VACUUM M CUP 4 TUBE (SUCTIONS) IMPLANT
GLOVE BIOGEL PI IND STRL 6.5 (GLOVE) ×1 IMPLANT
GLOVE BIOGEL PI IND STRL 7.0 (GLOVE) ×1 IMPLANT
GLOVE BIOGEL PI INDICATOR 6.5 (GLOVE) ×1
GLOVE BIOGEL PI INDICATOR 7.0 (GLOVE) ×1
GLOVE ECLIPSE 6.5 STRL STRAW (GLOVE) ×2 IMPLANT
GOWN STRL REUS W/TWL LRG LVL3 (GOWN DISPOSABLE) ×4 IMPLANT
KIT ABG SYR 3ML LUER SLIP (SYRINGE) IMPLANT
NDL HYPO 25X5/8 SAFETYGLIDE (NEEDLE) IMPLANT
NEEDLE HYPO 25X5/8 SAFETYGLIDE (NEEDLE) IMPLANT
NS IRRIG 1000ML POUR BTL (IV SOLUTION) ×2 IMPLANT
PACK C SECTION WH (CUSTOM PROCEDURE TRAY) ×2 IMPLANT
PAD ABD 7.5X8 STRL (GAUZE/BANDAGES/DRESSINGS) IMPLANT
PAD OB MATERNITY 4.3X12.25 (PERSONAL CARE ITEMS) ×2 IMPLANT
PENCIL SMOKE EVAC W/HOLSTER (ELECTROSURGICAL) ×2 IMPLANT
RTRCTR C-SECT PINK 25CM LRG (MISCELLANEOUS) ×2 IMPLANT
SUT MON AB 2-0 CT1 27 (SUTURE) ×2 IMPLANT
SUT MON AB 4-0 PS1 27 (SUTURE) IMPLANT
SUT PDS AB 0 CTX 60 (SUTURE) IMPLANT
SUT PLAIN 2 0 XLH (SUTURE) IMPLANT
SUT VIC AB 0 CTX 36 (SUTURE) ×4
SUT VIC AB 0 CTX36XBRD ANBCTRL (SUTURE) ×4 IMPLANT
SUT VIC AB 4-0 KS 27 (SUTURE) IMPLANT
TOWEL OR 17X24 6PK STRL BLUE (TOWEL DISPOSABLE) ×2 IMPLANT
TRAY FOLEY W/BAG SLVR 14FR LF (SET/KITS/TRAYS/PACK) ×2 IMPLANT
WATER STERILE IRR 1000ML POUR (IV SOLUTION) ×2 IMPLANT

## 2019-07-12 NOTE — Brief Op Note (Signed)
07/12/2019  1:10 PM  PATIENT:  Robin Miller  29 y.o. female  PRE-OPERATIVE DIAGNOSIS:  EDD: 07/18/2019; repeat C/S  POST-OPERATIVE DIAGNOSIS:  EDD: 07/18/2019; repeat C/S  PROCEDURE:  Procedure(s) with comments: CESAREAN SECTION (N/A) - Heather, RNFA low transverse  SURGEON:  Surgeon(s) and Role:    Rogue Bussing, Casimer Bilis, DO - Primary  ANESTHESIA:   spinal  EBL:  See anthesthesia report, normal appearance  FINDINGS: female infant, cephalic, clear fluid, APGARS 9/9, normal bilateral ovaries and tubes, vesicouterine adhesions easily taken down, no other significant scar tissue  SPECIMEN:  Source of Specimen:  cord blood  DISPOSITION OF SPECIMEN:  N/A  COUNTS:  YES  PLAN OF CARE: Admit to inpatient   PATIENT DISPOSITION:  PACU - hemodynamically stable.   Delay start of Pharmacological VTE agent (>24hrs) due to surgical blood loss or risk of bleeding: not applicable

## 2019-07-12 NOTE — Op Note (Signed)
NAME: Robin Miller, Robin Miller MEDICAL RECORD [id number] ACCOUNT [id number] DATE OF BIRTH:1990/06/25 FACILITY: [facility] LOCATION: [place], [place]  OPERATIVE REPORT  DATE OF PROCEDURE:  07/12/2019  PREOPERATIVE DIAGNOSES:   1.  Desired repeat cesarean section.  2.  Declined trial of labor after cesarean.  POSTOPERATIVE DIAGNOSES:   1.  Desired repeat cesarean section.  2.  Declined trial of labor after cesarean.  PROCEDURE:  Low transverse cesarean section.  SURGEON:  Allyn Kenner, DO  ASSISTANT:  Nira Conn, RNFA.  ANESTHESIA:  Spinal.  ESTIMATED BLOOD LOSS:  Normal in appearance, normal amount.  Please see anesthesia report for quantity.  FINDINGS:  Female infant, cephalic presentation, clear fluid, Apgars 9 and 9.  Normal bilateral ovaries and tubes.  Vesicouterine adhesions easily taken down.  No other significant scar tissue.  SPECIMENS:  Cord blood.  COMPLICATIONS:  None.  CONDITION:  Stable to PACU.  DESCRIPTION OF PROCEDURE:  The patient was taken to the operating room where spinal anesthesia was administered and found to be adequate.  She was prepped and draped in the normal sterile fashion in dorsal supine position.  A scalpel was used to make a  Pfannenstiel skin incision which was carried down to underlying layer of fascia with Bovie cautery.  Fascia was incised in the midline with a scalpel and extended laterally with Mayo scissors.  Kocher clamps were placed at the superior aspect of the  fascial incision.  Rectus muscles were dissected off bluntly and sharply.  Kocher clamps were then placed at the inferior aspect of the fascial incision.  Rectus muscles were dissected off bluntly and sharply.  Hemostat was used to bluntly dissect the  preperitoneal fat and with good visualization of the peritoneum, it was entered bluntly superiorly.  Manual lateral traction was performed to enlarge the peritoneal opening.  The abdomen and pelvis were surveyed and no  significant scar tissue noted as  above.  Next, the [employer] was placed.  The vesicouterine peritoneum was identified, entered sharply with Metzenbaum scissors and extended laterally.  The bladder flap was then developed digitally.  A low transverse cesarean incision was  made with a scalpel and amniotic sac entered sharply.  The hysterotomy was further enlarged by cephalic and caudal traction.  Infant's head was located, elevated and delivered without difficulty, followed by the remainder of the infant's body.  After 60  seconds of delayed cord clamping, the cord was cut and the infant was handed off to awaiting neonatology.  While delayed cord clamping, the infant was bulb suctioned and dried and was crying.  Cord blood was then collected and external massage of the  uterus with gentle traction on the umbilical cord facilitated delivery of intact placenta.  The uterus was cleared of all clot and debris and the uterine incision was closed with Vicryl in a running locked fashion, followed by a second layer of vertical  imbrication.  The serosal layer inferiorly at midline had small bleeding where the suture was emanating from and an additional controlled this bleeding.  The rest of the incision was hemostatic.  The [employer] was then removed and the  incision reexamined  and found to be hemostatic.  The peritoneum was then reapproximated and closed with Monocryl in a running fashion with 2 continuous sutures reapproximating the lower aspect of the rectus muscles.  Small point bleeders in rectus  muscles were controlled with Bovie cautery.  The fascia was then reapproximated and closed with looped PDS in a running fashion.  The subcutaneous tissue was then irrigated, dried and minimal use of Bovie cautery was needed for control of any bleeders.   Four interrupted plain gut sutures were placed subcutaneously and then the incision was reapproximated and skin closed with Vicryl  subcuticularly.  The patient tolerated the procedure well.  Sponge, lap and needle counts were correct x2.  The patient was  taken to recovery in stable condition.  VN/NUANCE  D:07/12/2019 T:07/12/2019 JOB:[id number]

## 2019-07-12 NOTE — H&P (Signed)
29 y.o. [redacted]w[redacted]d  G2P1001 comes in for scheduled repeat c/s, declines TOLAC.  Otherwise has good fetal movement and no bleeding.  Past Medical History:  Diagnosis Date  . Anemia   . Cancer ([place])    basal cell back and face  . 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 Depression   . Erosive esophagitis    LA Class B  . GERD (gastroesophageal reflux disease)   . Nausea vomiting and diarrhea   . Vasovagal syncope     Past Surgical History:  Procedure Laterality Date  . CESAREAN SECTION N/A 03/15/2017   Procedure: CESAREAN SECTION;  Surgeon: [name], MD;  Location: [place];  Service: Obstetrics;  Laterality: N/A;  . CHOLECYSTECTOMY N/A 03/09/2013   Procedure: LAPAROSCOPIC CHOLECYSTECTOMY;  Surgeon: Gayland Curry, MD;  [facility];  Service: General;  Laterality: N/A;  . COLONOSCOPY N/A 03/07/2013   Procedure: COLONOSCOPY;  Surgeon: Gatha Mayer, MD;  Location: [facility];  Service: Endoscopy;  Laterality: N/A;  . COLONOSCOPY    . CYSTOSCOPY WITH RETROGRADE PYELOGRAM, URETEROSCOPY AND STENT PLACEMENT  05/27/2012   Procedure: CYSTOSCOPY WITH RETROGRADE PYELOGRAM, URETEROSCOPY AND STENT PLACEMENT;  Surgeon: Dutch Gray, MD;  [facility];  Service: Urology;  Laterality: Right;  . ESOPHAGOGASTRODUODENOSCOPY N/A 03/04/2013   Procedure: ESOPHAGOGASTRODUODENOSCOPY (EGD);  Surgeon: Jerene Bears, MD;  Location: [facility];  Service: Endoscopy;  Laterality: N/A;  . HOLMIUM LASER APPLICATION  0000000   Procedure: HOLMIUM LASER APPLICATION;  Surgeon: Dutch Gray, MD;  [facility];  Service: Urology;  Laterality: Right;     . ileocetomy  03/16/2012  . LAPAROSCOPIC ILEOCECECTOMY  03/16/2012   Procedure: LAPAROSCOPIC ILEOCECECTOMY;  Surgeon: Adin Hector, MD;  Location: [place];  Service: General;  Laterality: N/A;  lap assisted ileocectomy  . SKIN CANCER EXCISION    .  TONSILLECTOMY AND ADENOIDECTOMY    . UPPER GASTROINTESTINAL ENDOSCOPY      OB History  Gravida Para Term Preterm AB Living  2 1 1     1   SAB TAB Ectopic Multiple Live Births        0 1    # Outcome Date GA Lbr Len/2nd Weight Sex Delivery Anes PTL Lv  2 Current           1 Term 03/16/17 [redacted]w[redacted]d  4005 g F CS-LTranv EPI  LIV    Social History   Socioeconomic History  . Marital status: Married    Spouse name: Not on file  . Number of children: 0  . Years of education: Not on file  . Highest education level: Not on file  Occupational History  . Occupation: Ship broker  Tobacco Use  . Smoking status: Never Smoker  . Smokeless tobacco: Never Used  Substance and Sexual Activity  . Alcohol use: No  . Drug use: No  . Sexual activity: Yes  Other Topics Concern  . Not on file  Social History Narrative  . Not on file   Social Determinants of Health   Financial Resource Strain:   . Difficulty of Paying Living Expenses:   Food Insecurity:   . Worried About Charity fundraiser in the Last Year:   . Arboriculturist in the Last Year:   Transportation Needs:   . Film/video editor (Medical):   Marland Kitchen Lack of Transportation (Non-Medical):   Physical Activity:   . Days  of Exercise per Week:   . Minutes of Exercise per Session:   Stress:   . Feeling of Stress :   Social Connections:   . Frequency of Communication with Friends and Family:   . Frequency of Social Gatherings with Friends and Family:   . Attends Religious Services:   . Active Member of Clubs or Organizations:   . Attends Archivist Meetings:   Marland Kitchen Marital Status:   Intimate Partner Violence:   . Fear of Current or Ex-Partner:   . Emotionally Abused:   Marland Kitchen Physically Abused:   . Sexually Abused:    Prednisone    Prenatal Transfer Tool  Maternal Diabetes: No Genetic Screening: Normal Maternal Ultrasounds/Referrals: Normal Fetal Ultrasounds or other Referrals:  None Maternal Substance Abuse:  No Significant  Maternal Medications:  None Significant Maternal Lab Results: Group B Strep negative  Other PNC: uncomplicated.    Vitals:   07/12/19 1031  BP: 130/90  Pulse: (!) 104  Resp: 20  Temp: 97.7 F (36.5 C)  TempSrc: Oral   In office: Lungs/Cor:  NAD Abdomen:  soft, gravid Ex:  no cords, erythema    A/P   Admit for schedule repeat c/s  GBS Neg  2g Ancef SCDs, other routine care  Allyn Kenner

## 2019-07-12 NOTE — Anesthesia Preprocedure Evaluation (Signed)
Anesthesia Evaluation  Patient identified by MRN, date of birth, ID band Patient awake    Reviewed: Allergy & Precautions, H&P , NPO status , Patient's Chart, lab work & pertinent test results, reviewed documented beta blocker date and time   Airway Mallampati: II  TM Distance: >3 FB Neck ROM: full    Dental no notable dental hx.    Pulmonary neg pulmonary ROS,    Pulmonary exam normal breath sounds clear to auscultation       Cardiovascular negative cardio ROS Normal cardiovascular exam Rhythm:regular Rate:Normal     Neuro/Psych PSYCHIATRIC DISORDERS Depression negative neurological ROS     GI/Hepatic Neg liver ROS, PUD, GERD  Medicated,  Endo/Other  negative endocrine ROS  Renal/GU negative Renal ROS  negative genitourinary   Musculoskeletal   Abdominal   Peds  Hematology  (+) Blood dyscrasia, anemia ,   Anesthesia Other Findings   Reproductive/Obstetrics (+) Pregnancy                             Anesthesia Physical Anesthesia Plan  ASA: II  Anesthesia Plan: Spinal   Post-op Pain Management:    Induction:   PONV Risk Score and Plan:   Airway Management Planned:   Additional Equipment:   Intra-op Plan:   Post-operative Plan:   Informed Consent: I have reviewed the patients History and Physical, chart, labs and discussed the procedure including the risks, benefits and alternatives for the proposed anesthesia with the patient or authorized representative who has indicated his/her understanding and acceptance.       Plan Discussed with:   Anesthesia Plan Comments:         Anesthesia Quick Evaluation

## 2019-07-12 NOTE — Transfer of Care (Signed)
Immediate Anesthesia Transfer of Care Note  Patient: Robin Miller  Procedure(s) Performed: CESAREAN SECTION (N/A )  Patient Location: PACU  Anesthesia Type:Spinal  Level of Consciousness: awake  Airway & Oxygen Therapy: Patient Spontanous Breathing  Post-op Assessment: Report given to RN and Post -op Vital signs reviewed and stable  Post vital signs: Reviewed and stable  Last Vitals:  Vitals Value Taken Time  BP    Temp    Pulse    Resp    SpO2      Last Pain:  Vitals:   07/12/19 1031  TempSrc: Oral  PainSc: 4          Complications: No apparent anesthesia complications

## 2019-07-12 NOTE — Progress Notes (Signed)
IV team no longer needed OR staff was able to obtain PIV

## 2019-07-13 LAB — CBC
HCT: 28.2 % — ABNORMAL LOW (ref 36.0–46.0)
Hemoglobin: 8.8 g/dL — ABNORMAL LOW (ref 12.0–15.0)
MCH: 27.2 pg (ref 26.0–34.0)
MCHC: 31.2 g/dL (ref 30.0–36.0)
MCV: 87 fL (ref 80.0–100.0)
Platelets: 240 10*3/uL (ref 150–400)
RBC: 3.24 MIL/uL — ABNORMAL LOW (ref 3.87–5.11)
RDW: 14.7 % (ref 11.5–15.5)
WBC: 10 10*3/uL (ref 4.0–10.5)
nRBC: 0 % (ref 0.0–0.2)

## 2019-07-13 LAB — BIRTH TISSUE RECOVERY COLLECTION (PLACENTA DONATION)

## 2019-07-13 NOTE — Progress Notes (Signed)
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 CSW met with MOB to offer support and complete assessment.    MOB sitting up in bed holding infant, when CSW entered the room. CSW introduced self and explained reason for consult to which MOB expressed understanding. MOB pleasant and easy to engage throughout assessment. CSW inquired about MOB's mental health history and MOB acknowledged a history of depression about 10 years ago around the time her parents were going through a divorce. MOB noted some mild anxiety over the years and stated it worsened after the birth of her daughter. MOB stated symptoms lasted for about the first six months but that she has noticed her anxiety in general has been worse than prior to previous pregnancy. MOB noted some increase in symptoms during pregnancy and reported she was started on Zoloft. MOB stated she feels the medication helps as she has more good days than she has bad days but noted that her bad days are still bad but overall she feels good with the medication. CSW provided education regarding the baby blues period vs. perinatal mood disorders, discussed treatment and gave resources for mental health follow up if concerns arise. CSW recommended self-evaluation during the postpartum time period using the New Mom Checklist from Postpartum Progress and encouraged MOB to contact a medical professional if symptoms are noted at any time. MOB did not appear to be displaying any acute mental health symptoms and denied any current SI, HI or DV. MOB reported having support from FOB, all of their family and a few friends.   MOB confirmed having all essential items for infant once discharged and stated infant would be sleeping in a bassinet once home. CSW provided review of Sudden Infant Death Syndrome (SIDS) precautions and safe sleeping habits.    CSW identifies no further need for intervention and no barriers to discharge at this time.  Elijio Miles, LCSW [facility] [employer] [facility] [phone]

## 2019-07-13 NOTE — Lactation Note (Signed)
This note was copied from a baby's chart. Lactation Consultation Note  Patient Name: Robin Miller [id number] Date: 07/13/2019 Reason for consult: Initial assessment;Term  P2 mother whose infant is now 30 hours old.  This is a term baby at 39+1 weeks.  Mother breast fed her first child (now 30 1/30 years old) for 6 months.  However, during this time period mother experienced difficulty due to her baby being "tongue tied."  She saw Dr. Audie Pinto and the revision was completed at 30 months.  Mother then continued breast feeding after that until 6 months.  Baby was asleep in the bassinet when I arrived.  Per 2 different MD's, mother was told her son is also "tongue tied."  I did not assess at this time since he is sleeping.  Mother stated that she feels like he has latched a couple of times without difficulty, however, her nipple is blanched and pinched when she finishes feeding him.  Cautioned her about making sure that she is not traumatizing the nipple during feedings.  Suggested tips/tricks to enhance latching and obtain a good deep latch.  Mother stated he has a wide gape and both lips are flanged at the breast.  Sometimes he "dimples" during feedings.  Offered to obtain a NS for greater comfort and to begin pumping with the DEBP after the next feeding.  Since the baby is just 30 hours old I also informed her that she could continue trying to latch and feed for another session before deciding whether or not to use the NS.  Mother is really not interested in using a NS stating that she had a hard time "getting my daughter off of it."  I acknowledged her wishes and still suggested she begin pumping.  Mother is willing to do this but would like to use her own personal pump rather than the hospital grade pump.  Informed her that I will not be able to assist with the set up/operation of her pump and mother is comfortable doing this on her own.  Encouraged continued hand expression before/after feedings to help  stimulate breasts.    Suggested mother contact Dr. Nicholaus Corolla office and set up an appointment for soon after discharge.  Mother was planning on doing this.  Encouraged to continue feeding on cue or at least 8-12 times/24 hours.  Baby shows feeding cues and mother is familiar with hand expression.  She will feed back any EBM she obtains to baby.  Mom made aware of O/P services, breastfeeding support groups, community resources, and our phone # for post-discharge questions.  Father present.   Maternal Data Formula Feeding for Exclusion: No Has patient been taught Hand Expression?: Yes Does the patient have breastfeeding experience prior to this delivery?: Yes  Feeding Feeding Type: Breast Fed  LATCH Score                   Interventions    Lactation Tools Discussed/Used     Consult Status Consult Status: Follow-up Date: 07/14/19 Follow-up type: In-patient    Lewin Pellow R Trenika Hudson 07/13/2019, 10:19 AM

## 2019-07-13 NOTE — Anesthesia Postprocedure Evaluation (Signed)
Anesthesia Post Note  Patient: HARMONIE WONDRA  Procedure(s) Performed: CESAREAN SECTION (N/A )     Patient location during evaluation: PACU Anesthesia Type: Spinal Level of consciousness: oriented and awake and alert Pain management: pain level controlled Vital Signs Assessment: post-procedure vital signs reviewed and stable Respiratory status: spontaneous breathing, respiratory function stable and patient connected to nasal cannula oxygen Cardiovascular status: blood pressure returned to baseline and stable Postop Assessment: no headache, no backache and no apparent nausea or vomiting Anesthetic complications: no    Last Vitals:  Vitals:   07/13/19 0229 07/13/19 0536  BP: 105/71 102/70  Pulse: 78 77  Resp: 16 18  Temp: 36.7 C   SpO2: 96% 96%    Last Pain:  Vitals:   07/13/19 0739  TempSrc:   PainSc: 6                  Nolia Tschantz

## 2019-07-13 NOTE — Progress Notes (Signed)
Subjective: Postpartum Day 1: Cesarean Delivery Patient reports tolerating PO and no problems voiding.    Objective: Vitals:   07/12/19 1823 07/12/19 2225 07/13/19 0229 07/13/19 0536  BP: 113/71 106/71 105/71 102/70  Pulse: 70 81 78 77  Resp: 20 18 16 18   Temp:  98.2 F (36.8 C) 98.1 F (36.7 C)   TempSrc:  Oral Oral   SpO2: 96% 98% 96% 96%    Physical Exam:  General: alert Lochia: appropriate Uterine Fundus: firm Incision: no significant drainage, no significant erythema, dressing in place DVT Evaluation: No evidence of DVT seen on physical exam.  Recent Labs    07/13/19 0601  HGB 8.8*  HCT 28.2*    Assessment/Plan: Status post Cesarean section. Doing well postoperatively.  Continue current care. Robin Miller [id number] PPD#1 sp rCS at [redacted]w[redacted]d 1. PPC: EBL 365, Hgb 10.8>8.8, greater than expected drop in hgb, hemodynamically stable and bleeding appropriate now. Will DC with PO iron 2. Obesity: BMI 37 3. Depression: zoloft started during pregnancy, patient reported feeling like she had undiagnosed PPD last pregnancy 4. Desires neonatal circumcision, R/B/A of procedure discussed at length. Pt understands that neonatal circumcision is not considered medically necessary and is elective. The risks include, but are not limited to bleeding, infection, damage to the penis, development of scar tissue, and having to have it redone at a later date. Pt understands theses risks and wishes to proceed. Plan for circ tomorrow due to difficulty with breastfeeding today Rubella immune, O POS, breastfeeding, baby boy in room  [place] 07/13/2019, 10:09 AM

## 2019-07-14 MED ORDER — IBUPROFEN 800 MG PO TABS: 800.0000 mg | ORAL_TABLET | Freq: Three times a day (TID) | ORAL | 0 refills | Status: DC

## 2019-07-14 MED ORDER — OXYCODONE HCL 5 MG PO TABS: 5.0000 mg | ORAL_TABLET | ORAL | 0 refills | Status: AC | PRN

## 2019-07-14 NOTE — Anesthesia Procedure Notes (Signed)
Spinal  Patient location during procedure: OR Start time: 07/12/2019 12:08 PM End time: 07/12/2019 12:12 PM Staffing Anesthesiologist: Janeece Riggers, MD Preanesthetic Checklist Completed: patient identified, IV checked, site marked, risks and benefits discussed, surgical consent, monitors and equipment checked, pre-op evaluation and timeout performed Spinal Block Patient position: sitting Prep: DuraPrep Patient monitoring: heart rate, cardiac monitor, continuous pulse ox and blood pressure Approach: midline Location: L3-4 Injection technique: single-shot Needle Needle type: Sprotte  Needle gauge: 24 G Needle length: 9 cm Assessment Sensory level: T4

## 2019-07-14 NOTE — Addendum Note (Signed)
Addendum  created 07/14/19 0758 by Janeece Riggers, MD   Child order released for a procedure order, Clinical Note Signed, Intraprocedure Blocks edited

## 2019-07-14 NOTE — Discharge Summary (Signed)
Obstetric Discharge Summary Reason for Admission: cesarean section Prenatal Procedures: none Intrapartum Procedures: cesarean: low cervical, transverse Postpartum Procedures: none Complications-Operative and Postpartum: none Hemoglobin  Date Value Ref Range Status  07/13/2019 8.8 (L) 12.0 - 15.0 g/dL Final   HCT  Date Value Ref Range Status  07/13/2019 28.2 (L) 36.0 - 46.0 % Final    Physical Exam:  General: alert, cooperative and appears stated age 29: appropriate Uterine Fundus: firm Incision: healing well, no significant drainage DVT Evaluation: No evidence of DVT seen on physical exam.  Discharge Diagnoses: Term Pregnancy-delivered  Discharge Information: Date: 07/14/2019 Activity: pelvic rest Diet: routine Medications: PNV, Ibuprofen and oxycodone and zoloft Condition: stable Instructions: refer to practice specific booklet Discharge to: home   Newborn Data: Live born female  Birth Weight: 8 lb 0.6 oz (3645 g) APGAR: 9, 9  Newborn Delivery   Birth date/time: 07/12/2019 12:29:00 Delivery type: C-Section, Low Transverse Trial of labor: No C-section categorization: Repeat      Home with mother.  [place] 07/14/2019, 9:01 AM

## 2019-07-14 NOTE — Lactation Note (Signed)
This note was copied from a baby's chart. Lactation Consultation Note  Patient Name: Robin Miller [id number] Date: 07/14/2019 Reason for consult: Follow-up assessment   Baby 52 hours old. 9.6% weight loss.  Oral assessment indicated baby has short anterior lingual frenulum. Parents are aware and have discussed OP to [facility] DMD with Ped MD. First child had frenotomy at 2.5 mos with [facility] DMD.  Observed latching with lips flanged. Stools are transitioning to green. Suggest OP appt with lactation after revision and post pumping to supplement after feedings to help stabilize weight loss.   Feed on demand with cues.  Goal 8-12+ times per day after first 24 hrs.  Place baby STS if not cueing.  Reviewed engorgement care and monitoring voids/stools.     Maternal Data    Feeding Feeding Type: Breast Fed  LATCH Score Latch: Grasps breast easily, tongue down, lips flanged, rhythmical sucking.  Audible Swallowing: A few with stimulation  Type of Nipple: Everted at rest and after stimulation  Comfort (Breast/Nipple): Soft / non-tender  Hold (Positioning): No assistance needed to correctly position infant at breast.  LATCH Score: 9  Interventions Interventions: Breast feeding basics reviewed  Lactation Tools Discussed/Used     Consult Status Consult Status: Complete Date: 07/14/19    Vivianne Master [facility] 07/14/2019, 12:03 PM

## 2019-07-14 NOTE — Progress Notes (Signed)
Patient is doing well.  She is tolerating PO, ambulating, voiding.  Pain is controlled.  Lochia is appropriate  Vitals:   07/13/19 0229 07/13/19 0536 07/13/19 1533 07/13/19 2133  BP: 105/71 102/70 114/73 119/71  Pulse: 78 77 67 80  Resp: 16 18 16 18   Temp: 98.1 F (36.7 C)  98.2 F (36.8 C) 98.2 F (36.8 C)  TempSrc: Oral  Oral Oral  SpO2: 96% 96% 97% 98%    NAD Lungs:   clear to auscultation Heart:   RRR Abdomen:  soft, appropriate tenderness, incisions intact and without erythema or drainage ext:    Symmetric, 1+ edema bilaterally  Lab Results  Component Value Date   WBC 10.0 07/13/2019   HGB 8.8 (L) 07/13/2019   HCT 28.2 (L) 07/13/2019   MCV 87.0 07/13/2019   PLT 240 07/13/2019    --/--/O POS (03/14 0930)/RImmune  A/P    29 y.o. G2P2002 POD 2 s/p RCS Routine post op and postpartum care.   History of depression: on zoloft  Meeting all goals.  Discharge to home today.

## 2019-08-11 DIAGNOSIS — Z124 Encounter for screening for malignant neoplasm of cervix: Secondary | ICD-10-CM | POA: Diagnosis not present

## 2019-08-25 DIAGNOSIS — Z3202 Encounter for pregnancy test, result negative: Secondary | ICD-10-CM | POA: Diagnosis not present

## 2019-08-25 DIAGNOSIS — Z30430 Encounter for insertion of intrauterine contraceptive device: Secondary | ICD-10-CM | POA: Diagnosis not present

## 2019-09-28 DIAGNOSIS — Z30431 Encounter for routine checking of intrauterine contraceptive device: Secondary | ICD-10-CM | POA: Diagnosis not present

## 2019-10-24 DIAGNOSIS — J020 Streptococcal pharyngitis: Secondary | ICD-10-CM | POA: Diagnosis not present

## 2020-04-01 DIAGNOSIS — R059 Cough, unspecified: Secondary | ICD-10-CM | POA: Diagnosis not present

## 2020-04-01 DIAGNOSIS — H9203 Otalgia, bilateral: Secondary | ICD-10-CM | POA: Diagnosis not present

## 2020-04-06 DIAGNOSIS — D225 Melanocytic nevi of trunk: Secondary | ICD-10-CM | POA: Diagnosis not present

## 2020-04-06 DIAGNOSIS — D2272 Melanocytic nevi of left lower limb, including hip: Secondary | ICD-10-CM | POA: Diagnosis not present

## 2020-04-06 DIAGNOSIS — Z1283 Encounter for screening for malignant neoplasm of skin: Secondary | ICD-10-CM | POA: Diagnosis not present

## 2020-04-06 DIAGNOSIS — L905 Scar conditions and fibrosis of skin: Secondary | ICD-10-CM | POA: Diagnosis not present

## 2020-04-06 DIAGNOSIS — D485 Neoplasm of uncertain behavior of skin: Secondary | ICD-10-CM | POA: Diagnosis not present

## 2020-04-06 DIAGNOSIS — K5080 Crohn's disease of both small and large intestine without complications: Secondary | ICD-10-CM | POA: Diagnosis not present

## 2020-04-23 DIAGNOSIS — D485 Neoplasm of uncertain behavior of skin: Secondary | ICD-10-CM | POA: Diagnosis not present

## 2020-04-23 DIAGNOSIS — L988 Other specified disorders of the skin and subcutaneous tissue: Secondary | ICD-10-CM | POA: Diagnosis not present

## 2021-02-15 ENCOUNTER — Encounter (HOSPITAL_COMMUNITY): Payer: Self-pay | Admitting: Emergency Medicine

## 2021-02-15 ENCOUNTER — Other Ambulatory Visit: Payer: Self-pay

## 2021-02-15 ENCOUNTER — Inpatient Hospital Stay (HOSPITAL_COMMUNITY): Payer: Commercial Managed Care - PPO

## 2021-02-15 ENCOUNTER — Inpatient Hospital Stay (HOSPITAL_COMMUNITY)
Admission: EM | Admit: 2021-02-15 | Discharge: 2021-02-15 | Disposition: A | Discharge status: Home / Self Care | Payer: Commercial Managed Care - PPO | Attending: Obstetrics and Gynecology | Admitting: Obstetrics and Gynecology

## 2021-02-15 DIAGNOSIS — U071 COVID-19: Secondary | ICD-10-CM | POA: Diagnosis not present

## 2021-02-15 DIAGNOSIS — O3680X0 Pregnancy with inconclusive fetal viability, not applicable or unspecified: Secondary | ICD-10-CM | POA: Diagnosis not present

## 2021-02-15 DIAGNOSIS — R109 Unspecified abdominal pain: Secondary | ICD-10-CM

## 2021-02-15 DIAGNOSIS — Z87442 Personal history of urinary calculi: Secondary | ICD-10-CM | POA: Insufficient documentation

## 2021-02-15 DIAGNOSIS — O26891 Other specified pregnancy related conditions, first trimester: Secondary | ICD-10-CM | POA: Insufficient documentation

## 2021-02-15 DIAGNOSIS — Z79899 Other long term (current) drug therapy: Secondary | ICD-10-CM | POA: Insufficient documentation

## 2021-02-15 DIAGNOSIS — O98511 Other viral diseases complicating pregnancy, first trimester: Secondary | ICD-10-CM | POA: Insufficient documentation

## 2021-02-15 DIAGNOSIS — Z3A01 Less than 8 weeks gestation of pregnancy: Secondary | ICD-10-CM | POA: Insufficient documentation

## 2021-02-15 DIAGNOSIS — M549 Dorsalgia, unspecified: Secondary | ICD-10-CM | POA: Insufficient documentation

## 2021-02-15 DIAGNOSIS — Z888 Allergy status to other drugs, medicaments and biological substances status: Secondary | ICD-10-CM | POA: Insufficient documentation

## 2021-02-15 DIAGNOSIS — R1030 Lower abdominal pain, unspecified: Secondary | ICD-10-CM | POA: Diagnosis not present

## 2021-02-15 DIAGNOSIS — O26899 Other specified pregnancy related conditions, unspecified trimester: Secondary | ICD-10-CM

## 2021-02-15 DIAGNOSIS — Z3490 Encounter for supervision of normal pregnancy, unspecified, unspecified trimester: Secondary | ICD-10-CM

## 2021-02-15 LAB — URINALYSIS, ROUTINE W REFLEX MICROSCOPIC
Glucose, UA: NEGATIVE mg/dL
Ketones, ur: 40 mg/dL — AB
Leukocytes,Ua: NEGATIVE
Nitrite: NEGATIVE
Protein, ur: NEGATIVE mg/dL
Specific Gravity, Urine: >1.03 — ABNORMAL HIGH (ref 1.005–1.030)
pH: 6 (ref 5.0–8.0)

## 2021-02-15 LAB — RESP PANEL BY RT-PCR (FLU A&B, COVID) ARPGX2
Influenza A by PCR: NEGATIVE
Influenza B by PCR: NEGATIVE
SARS Coronavirus 2 by RT PCR: POSITIVE — AB

## 2021-02-15 LAB — URINALYSIS, MICROSCOPIC (REFLEX)

## 2021-02-15 LAB — HCG, QUANTITATIVE, PREGNANCY: hCG, Beta Chain, Quant, S: 9955 m[IU]/mL — ABNORMAL HIGH (ref <5)

## 2021-02-15 LAB — COMPREHENSIVE METABOLIC PANEL
ALT: 15 U/L (ref 0–44)
AST: 21 U/L (ref 15–41)
Albumin: 4 g/dL (ref 3.5–5.0)
Alkaline Phosphatase: 71 U/L (ref 38–126)
Anion gap: 12 (ref 5–15)
BUN: 5 mg/dL — ABNORMAL LOW (ref 6–20)
CO2: 17 mmol/L — ABNORMAL LOW (ref 22–32)
Calcium: 9.2 mg/dL (ref 8.9–10.3)
Chloride: 106 mmol/L (ref 98–111)
Creatinine, Ser: 0.71 mg/dL (ref 0.44–1.00)
GFR, Estimated: >60 mL/min (ref >60)
Glucose, Bld: 124 mg/dL — ABNORMAL HIGH (ref 70–99)
Potassium: 3.5 mmol/L (ref 3.5–5.1)
Sodium: 135 mmol/L (ref 135–145)
Total Bilirubin: 0.9 mg/dL (ref 0.3–1.2)
Total Protein: 7.4 g/dL (ref 6.5–8.1)

## 2021-02-15 LAB — CBC
HCT: 39.6 % (ref 36.0–46.0)
Hemoglobin: 13.3 g/dL (ref 12.0–15.0)
MCH: 29.3 pg (ref 26.0–34.0)
MCHC: 33.6 g/dL (ref 30.0–36.0)
MCV: 87.2 fL (ref 80.0–100.0)
Platelets: 206 10*3/uL (ref 150–400)
RBC: 4.54 MIL/uL (ref 3.87–5.11)
RDW: 13.2 % (ref 11.5–15.5)
WBC: 4.5 10*3/uL (ref 4.0–10.5)
nRBC: 0 % (ref 0.0–0.2)

## 2021-02-15 LAB — POC URINE PREG, ED: Preg Test, Ur: POSITIVE — AB

## 2021-02-15 LAB — ABO/RH: ABO/RH(D): O POS

## 2021-02-15 MED ORDER — NIRMATRELVIR/RITONAVIR (PAXLOVID)TABLET
3.0000 | ORAL_TABLET | Freq: Two times a day (BID) | ORAL | Status: DC
  Administered 2021-02-15: 3 via ORAL
  Filled 2021-02-15: qty 30

## 2021-02-15 MED ORDER — LACTATED RINGERS IV SOLN: Freq: Once | INTRAVENOUS | Status: AC

## 2021-02-15 MED ORDER — ACETAMINOPHEN 500 MG PO TABS
1000.0000 mg | ORAL_TABLET | Freq: Once | ORAL | Status: AC
  Administered 2021-02-15: 1000 mg via ORAL
  Filled 2021-02-15: qty 2

## 2021-02-15 MED ORDER — CYCLOBENZAPRINE HCL 5 MG PO TABS
10.0000 mg | ORAL_TABLET | Freq: Once | ORAL | Status: AC
  Administered 2021-02-15: 10 mg via ORAL
  Filled 2021-02-15: qty 2

## 2021-02-15 MED ORDER — PROMETHAZINE HCL 25 MG/ML IJ SOLN
25.0000 mg | Freq: Once | INTRAVENOUS | Status: AC
  Administered 2021-02-15: 25 mg via INTRAVENOUS
  Filled 2021-02-15: qty 1

## 2021-02-15 MED ORDER — PROMETHAZINE HCL 12.5 MG PO TABS: 12.5000 mg | ORAL_TABLET | Freq: Four times a day (QID) | ORAL | 0 refills | Status: DC | PRN

## 2021-02-15 MED ORDER — HYDROMORPHONE HCL 1 MG/ML IJ SOLN
1.0000 mg | INTRAMUSCULAR | Status: DC | PRN
  Administered 2021-02-15: 1 mg via INTRAVENOUS
  Filled 2021-02-15: qty 1

## 2021-02-15 MED ORDER — CYCLOBENZAPRINE HCL 10 MG PO TABS: 10.0000 mg | ORAL_TABLET | Freq: Two times a day (BID) | ORAL | 0 refills | Status: DC | PRN

## 2021-02-15 NOTE — ED Triage Notes (Signed)
Pt had +home covid test today. Symptoms started last night and include fever, n/v, and back pain. Also reports she is [redacted] weeks pregnant. No prenatal care yet.

## 2021-02-15 NOTE — MAU Provider Note (Addendum)
History     CSN: [id number]  Arrival date and time: 02/15/21 1725  Seen by provider at [facility]  Patient presents with   Emesis   Fever   HPI Robin Miller 30 y.o. [redacted]w[redacted]d Transferred from [facility] ER.  Having symptoms that started today - vomiting bile, nausea, pain in her back and in her lower abdomen.  Having trouble resting in bed due to severe pain - asking for medication right away.  Tested positive for Covid at home today.  Husband tested negative.  O2 sat 98%.  Denies shortness of breath and cough.  Having severe pain in back and lower abdomen that started earlier today.  Has a history of kidney stones.  Vomiting bile at home.  Does not think she can take any oral medications now.   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   Depression    Erosive esophagitis    LA Class B   GERD (gastroesophageal reflux disease)    Nausea vomiting and diarrhea    Vasovagal syncope     Past Surgical History:  Procedure Laterality Date   CESAREAN SECTION N/A 03/15/2017   Procedure: CESAREAN SECTION;  Surgeon: [name], MD;  Location: [place];  Service: Obstetrics;  Laterality: N/A;   CESAREAN SECTION N/A 07/12/2019   Procedure: CESAREAN SECTION;  Surgeon: Allyn Kenner, DO;  Location: [facility];  Service: Obstetrics;  Laterality: N/ANira Conn, RNFA   CHOLECYSTECTOMY N/A 03/09/2013   Procedure: LAPAROSCOPIC CHOLECYSTECTOMY;  Surgeon: Gayland Curry, MD;  Location: [facility];  Service: General;  Laterality: N/A;   COLONOSCOPY N/A 03/07/2013   Procedure: COLONOSCOPY;  Surgeon: Gatha Mayer, MD;   Location: [facility];  Service: Endoscopy;  Laterality: N/A;   COLONOSCOPY     CYSTOSCOPY WITH RETROGRADE PYELOGRAM, URETEROSCOPY AND STENT PLACEMENT  05/27/2012   Procedure: CYSTOSCOPY WITH RETROGRADE PYELOGRAM, URETEROSCOPY AND STENT PLACEMENT;  Surgeon: Dutch Gray, MD;  Location: [facility];  Service: Urology;  Laterality: Right;   ESOPHAGOGASTRODUODENOSCOPY N/A 03/04/2013   Procedure: ESOPHAGOGASTRODUODENOSCOPY (EGD);  Surgeon: Jerene Bears, MD;  Location: [facility];  Service: Endoscopy;  Laterality: N/A;   HOLMIUM LASER APPLICATION  8/78/6767   Procedure: HOLMIUM LASER APPLICATION;  Surgeon: Dutch Gray, MD;  Location: [facility];  Service: Urology;  Laterality: Right;      ileocetomy  03/16/2012   LAPAROSCOPIC ILEOCECECTOMY  03/16/2012   Procedure: LAPAROSCOPIC ILEOCECECTOMY;  Surgeon: Adin Hector, MD;  Location: [place];  Service: General;  Laterality: N/A;  lap assisted ileocectomy   SKIN CANCER EXCISION     TONSILLECTOMY AND ADENOIDECTOMY     UPPER GASTROINTESTINAL ENDOSCOPY      Family History  Problem Relation Age of Onset   Stroke Mother    Breast cancer Mother    Crohn's  disease Father    Prostate cancer Father    Hyperlipidemia Father    Breast cancer Paternal Aunt    Colitis Maternal Aunt    Colon cancer Paternal Grandmother        ? cancer, part of colon removed   Stroke Paternal Grandmother    Esophageal cancer Neg Hx    Pancreatic cancer Neg Hx    Rectal cancer Neg Hx    Stomach cancer Neg Hx     Social History   Tobacco Use   Smoking status: Never   Smokeless tobacco: Never  Vaping Use   Vaping Use: Never used  Substance Use Topics   Alcohol use: No   Drug use: No    Allergies:  Allergies  Allergen Reactions   Prednisone Nausea Only, Other (See Comments) and Tinitus    Blurred vision,insomnia, shaky, body convulsions,     Medications Prior to Admission  Medication Sig Dispense Refill Last Dose   Prenatal Vit-Fe Fumarate-FA (PRENATAL  MULTIVITAMIN) TABS tablet Take 1 tablet by mouth daily at 12 noon.   02/15/2021   esomeprazole (NEXIUM) 20 MG capsule Take 20 mg by mouth daily at 12 noon.      ibuprofen (ADVIL) 800 MG tablet Take 1 tablet (800 mg total) by mouth every 8 (eight) hours. 60 tablet 0    mesalamine (APRISO) 0.375 g 24 hr capsule Take 1.5 mg by mouth daily.      sertraline (ZOLOFT) 50 MG tablet Take 50 mg by mouth at bedtime.        Review of Systems  Constitutional:  Positive for fever.  Gastrointestinal:  Positive for abdominal pain, nausea and vomiting.  Genitourinary:  Negative for dysuria, vaginal bleeding and vaginal discharge.  Musculoskeletal:  Positive for back pain.  Physical Exam   Blood pressure 99/63, pulse (!) 119, temperature 100.2 F (37.9 C), temperature source Oral, resp. rate 20, last menstrual period 01/05/2021, SpO2 100 %, unknown if currently breastfeeding.  Physical Exam Vitals and nursing note reviewed.  Constitutional:      Appearance: She is well-developed. She is ill-appearing.  HENT:     Head: Normocephalic.  Cardiovascular:     Rate and Rhythm: Regular rhythm. Tachycardia present.  Pulmonary:     Effort: Pulmonary effort is normal.  Musculoskeletal:        General: Normal range of motion.     Cervical back: Neck supple.  Skin:    General: Skin is warm and dry.  Neurological:     Mental Status: She is alert and oriented to person, place, and time.  Psychiatric:        Mood and Affect: Mood normal.        Behavior: Behavior normal.        Thought Content: Thought content normal.   US OB Comp Less 14 Wks  Result Date: 02/15/2021 CLINICAL DATA:  Back pain EXAM: OBSTETRIC <14 WK ULTRASOUND TECHNIQUE: Transabdominal ultrasound was performed for evaluation of the gestation as well as the maternal uterus and adnexal regions. COMPARISON:  None. FINDINGS: Intrauterine gestational sac: Probable single intrauterine gestational sac Yolk sac:  Not Visualized. Embryo:  Not  Visualized. MSD:  6.2 mm   5 w   2 d Maternal uterus/adnexae: Ovaries are within normal limits. Right ovary measures 1.9 x 2.7 x 2.1 cm partially visualized left ovary grossly within normal limits measuring 2.4 x 2.4 cm, poorly visible in the transverse plane. No free fluid IMPRESSION: Probable early intrauterine gestational sac, but no yolk  sac, fetal pole, or cardiac activity yet visualized. Recommend follow-up quantitative B-HCG levels and follow-up US in 14 days to confirm and assess viability. This recommendation follows SRU consensus guidelines: Diagnostic Criteria for Nonviable Pregnancy Early in the First Trimester. [employer] Med 2013; 355:7322-02. Electronically Signed   By: Donavan Foil M.D.   On: 02/15/2021 21:02   US OB Transvaginal  Result Date: 02/15/2021 CLINICAL DATA:  Back pain EXAM: OBSTETRIC <14 WK ULTRASOUND TECHNIQUE: Transabdominal ultrasound was performed for evaluation of the gestation as well as the maternal uterus and adnexal regions. COMPARISON:  None. FINDINGS: Intrauterine gestational sac: Probable single intrauterine gestational sac Yolk sac:  Not Visualized. Embryo:  Not Visualized. MSD:  6.2 mm   5 w   2 d Maternal uterus/adnexae: Ovaries are within normal limits. Right ovary measures 1.9 x 2.7 x 2.1 cm partially visualized left ovary grossly within normal limits measuring 2.4 x 2.4 cm, poorly visible in the transverse plane. No free fluid IMPRESSION: Probable early intrauterine gestational sac, but no yolk sac, fetal pole, or cardiac activity yet visualized. Recommend follow-up quantitative B-HCG levels and follow-up US in 14 days to confirm and assess viability. This recommendation follows SRU consensus guidelines: Diagnostic Criteria for Nonviable Pregnancy Early in the First Trimester. [employer] Med 2013; 542:7062-37. Electronically Signed   By: Donavan Foil M.D.   On: 02/15/2021 21:02   US RENAL  Result Date: 02/15/2021 CLINICAL DATA:  Severe back pain. EXAM: RENAL /  URINARY TRACT ULTRASOUND COMPLETE COMPARISON:  Ultrasound dated 03/10/2013. FINDINGS: Right Kidney: Renal measurements: 11.4 x 4.0 x 5.0 cm = volume: 120 mL. Echogenicity within normal limits. No mass or hydronephrosis visualized. Left Kidney: Renal measurements: 9.6 x 6.4 x 4.6 cm = volume: 101 mL. Echogenicity within normal limits. No mass or hydronephrosis visualized. Bladder: Appears normal for degree of bladder distention. Other: There is diffuse increased liver echogenicity most commonly seen in the setting of fatty infiltration. Superimposed inflammation or fibrosis is not excluded. Clinical correlation is recommended. IMPRESSION: 1. Unremarkable kidneys and urinary bladder. 2. Fatty liver. Electronically Signed   By: Anner Crete M.D.   On: 02/15/2021 21:03    Results for orders placed or performed during the hospital encounter of 02/15/21 (from the past 24 hour(s))  POC urine preg, ED     Status: Abnormal   Collection Time: 02/15/21  6:02 PM  Result Value Ref Range   Preg Test, Ur POSITIVE (A) NEGATIVE  Urinalysis, Routine w reflex microscopic     Status: Abnormal   Collection Time: 02/15/21  6:58 PM  Result Value Ref Range   Color, Urine YELLOW YELLOW   APPearance CLOUDY (A) CLEAR   Specific Gravity, Urine >1.030 (H) 1.005 - 1.030   pH 6.0 5.0 - 8.0   Glucose, UA NEGATIVE NEGATIVE mg/dL   Hgb urine dipstick SMALL (A) NEGATIVE   Bilirubin Urine SMALL (A) NEGATIVE   Ketones, ur 40 (A) NEGATIVE mg/dL   Protein, ur NEGATIVE NEGATIVE mg/dL   Nitrite NEGATIVE NEGATIVE   Leukocytes,Ua NEGATIVE NEGATIVE  Urinalysis, Microscopic (reflex)     Status: Abnormal   Collection Time: 02/15/21  6:58 PM  Result Value Ref Range   RBC / HPF 6-10 0 - 5 RBC/hpf   WBC, UA 0-5 0 - 5 WBC/hpf   Bacteria, UA MANY (A) NONE SEEN   Squamous Epithelial / LPF 6-10 0 - 5   Mucus PRESENT    Urine-Other MICROSCOPIC EXAM PERFORMED ON UNCONCENTRATED URINE  Resp Panel by RT-PCR (Flu A&B, Covid) Nasopharyngeal  Swab     Status: Abnormal   Collection Time: 02/15/21  7:10 PM   Specimen: Nasopharyngeal Swab; Nasopharyngeal(NP) swabs in vial transport medium  Result Value Ref Range   SARS Coronavirus 2 by RT PCR POSITIVE (A) NEGATIVE   Influenza A by PCR NEGATIVE NEGATIVE   Influenza B by PCR NEGATIVE NEGATIVE  ABO/Rh     Status: None   Collection Time: 02/15/21  7:42 PM  Result Value Ref Range   ABO/RH(D)      O POS Performed at [facility] [address], [place], [place] [address]   Comprehensive metabolic panel     Status: Abnormal   Collection Time: 02/15/21  7:42 PM  Result Value Ref Range   Sodium 135 135 - 145 mmol/L   Potassium 3.5 3.5 - 5.1 mmol/L   Chloride 106 98 - 111 mmol/L   CO2 17 (L) 22 - 32 mmol/L   Glucose, Bld 124 (H) 70 - 99 mg/dL   BUN 5 (L) 6 - 20 mg/dL   Creatinine, Ser 0.71 0.44 - 1.00 mg/dL   Calcium 9.2 8.9 - 10.3 mg/dL   Total Protein 7.4 6.5 - 8.1 g/dL   Albumin 4.0 3.5 - 5.0 g/dL   AST 21 15 - 41 U/L   ALT 15 0 - 44 U/L   Alkaline Phosphatase 71 38 - 126 U/L   Total Bilirubin 0.9 0.3 - 1.2 mg/dL   GFR, Estimated >60 >60 mL/min   Anion gap 12 5 - 15  CBC     Status: None   Collection Time: 02/15/21  7:42 PM  Result Value Ref Range   WBC 4.5 4.0 - 10.5 K/uL   RBC 4.54 3.87 - 5.11 MIL/uL   Hemoglobin 13.3 12.0 - 15.0 g/dL   HCT 39.6 36.0 - 46.0 %   MCV 87.2 80.0 - 100.0 fL   MCH 29.3 26.0 - 34.0 pg   MCHC 33.6 30.0 - 36.0 g/dL   RDW 13.2 11.5 - 15.5 %   Platelets 206 150 - 400 K/uL   nRBC 0.0 0.0 - 0.2 %  hCG, quantitative, pregnancy     Status: Abnormal   Collection Time: 02/15/21  7:42 PM  Result Value Ref Range   hCG, Beta Chain, Quant, S 9,955 (H) <5 mIU/mL     MAU Course  Procedures  MDM Limited exam on arrival.  In pain and requesting medication.  History of kidney stones.  Possible stone, positive covid, early pregnancy - unknown anatomic location.  Doing labs, OB US and renal US to determine cause of pain.  Will give Dilaudid IV  for pain.  2035  Feeling better after Dilaudid.  Phenergan fluids running.  Ultrasound at bedside.  2110  Reviewed plan of care with Dr. Kennon Rounds.  Reviewed quant, [place] and renal US results.  Probable sac noted on [place] - no yolk sac at [redacted]w[redacted]d.  Will plan to recheck quant in 48 hours.  Patient is continuing to have pain.  Will treat as musculoskeletal pain in Covid and will give Flexeril 10 mg PO.  Care assumed by Milinda Cave at 2115  Assessment and Plan  Pregnancy of unknown anatomic location Covid positive   Terri L Burleson 02/15/2021, 7:19 PM   Reassessment (10:17 PM)  -Results discussed with patient. -Patient reports some improvement in pain with flexeril dosing.  -Informed of need to return in 48 hours for repeat hCG. -Return on Sunday  10/23 at 1930. -Reviewed symptoms and treatment for Covid. -Informed that findings not significant for kidney stones, but urine culture ordered for further evaluation. -Discussed usage of Paxlovid for Covid symptoms.  Patient agreeable. -Order placed and patient will receive all doses from main pharmacy. -Orders also placed for flexeril and phenergan.  -Reviewed return precautions. -Encouraged rest and hydration. -Encouraged to call primary office or return to MAU if symptoms worsen or with the onset of new symptoms. -Discharged to home in stable condition.  Maryann Conners MSN, CNM Advanced Practice Provider, [facility] [employer] [facility]

## 2021-02-15 NOTE — ED Provider Notes (Signed)
Emergency Medicine Provider OB Triage Evaluation Note  Robin Miller is a 30 y.o. female, G3P2002, at Unknown gestation who presents to the emergency department with complaints of pelvic and back pain.  This started last night.  She has not had any prenatal care so far this pregnancy.  She is nauseated with multiple episodes of vomiting.  No diarrhea. Of note she did take a home COVID test yesterday that was positive.  No vaginal bleeding or spotting.  Review of  Systems  Positive: Pelvic and low back pain, fever, vomiting and nausea.  Negative: Syncope, cough  Physical Exam  BP 114/79 (BP Location: Left Arm)   Pulse (!) 126   Temp (!) 102 F (38.9 C) (Oral)   Resp 20   LMP 01/05/2021 (Exact Date)   SpO2 100%  General: Awake, Appears uncomfortable.  HEENT: Atraumatic  Resp: Normal effort  Cardiac: tachycardic.  Abd: Nondistended, Tender lower abdomen. MSK: Moves all extremities without difficulty Neuro: Speech clear  Medical Decision Making  Pt evaluated for pregnancy concern and is stable for transfer to MAU. Pt is in agreement with plan for transfer.  6:10 PM Discussed with MAU APP, Erin who accepts patient in transfer. We discussed patients HR, BP, Covid positive,  exam and symptoms.    Clinical Impression  No diagnosis found.     Ollen Gross 02/15/21 2223    Lucrezia Starch, MD 02/15/21 2253

## 2021-02-15 NOTE — MAU Note (Signed)
Transfer from [facility]. C/O severe back and shoulder pain. Started having symptoms this afternoon of N/V and back pain. Took home covid test and it was positive. Sent from [facility] due to positive pernancy test and back and pelvic pai.n. Denies any vag bleeding or discharge. Pt stated she thinks it could be a kidney stone.

## 2021-02-17 ENCOUNTER — Inpatient Hospital Stay (HOSPITAL_COMMUNITY): Payer: Commercial Managed Care - PPO

## 2021-02-17 ENCOUNTER — Other Ambulatory Visit: Payer: Self-pay

## 2021-02-17 ENCOUNTER — Encounter (HOSPITAL_COMMUNITY): Payer: Self-pay | Admitting: Obstetrics and Gynecology

## 2021-02-17 ENCOUNTER — Inpatient Hospital Stay (HOSPITAL_COMMUNITY)
Admission: AD | Admit: 2021-02-17 | Discharge: 2021-02-17 | Disposition: A | Discharge status: Home / Self Care | Payer: Commercial Managed Care - PPO | Attending: Obstetrics and Gynecology | Admitting: Obstetrics and Gynecology

## 2021-02-17 DIAGNOSIS — O99281 Endocrine, nutritional and metabolic diseases complicating pregnancy, first trimester: Secondary | ICD-10-CM

## 2021-02-17 DIAGNOSIS — Z888 Allergy status to other drugs, medicaments and biological substances status: Secondary | ICD-10-CM | POA: Insufficient documentation

## 2021-02-17 DIAGNOSIS — O219 Vomiting of pregnancy, unspecified: Secondary | ICD-10-CM | POA: Insufficient documentation

## 2021-02-17 DIAGNOSIS — Z3A01 Less than 8 weeks gestation of pregnancy: Secondary | ICD-10-CM | POA: Diagnosis not present

## 2021-02-17 DIAGNOSIS — E876 Hypokalemia: Secondary | ICD-10-CM | POA: Diagnosis not present

## 2021-02-17 DIAGNOSIS — O26891 Other specified pregnancy related conditions, first trimester: Secondary | ICD-10-CM | POA: Diagnosis present

## 2021-02-17 DIAGNOSIS — Z8616 Personal history of COVID-19: Secondary | ICD-10-CM | POA: Insufficient documentation

## 2021-02-17 DIAGNOSIS — U071 COVID-19: Secondary | ICD-10-CM

## 2021-02-17 DIAGNOSIS — O98511 Other viral diseases complicating pregnancy, first trimester: Secondary | ICD-10-CM

## 2021-02-17 DIAGNOSIS — O3680X0 Pregnancy with inconclusive fetal viability, not applicable or unspecified: Secondary | ICD-10-CM | POA: Diagnosis not present

## 2021-02-17 DIAGNOSIS — Z79899 Other long term (current) drug therapy: Secondary | ICD-10-CM | POA: Diagnosis not present

## 2021-02-17 LAB — CULTURE, OB URINE

## 2021-02-17 LAB — CBC
HCT: 40.6 % (ref 36.0–46.0)
Hemoglobin: 13.4 g/dL (ref 12.0–15.0)
MCH: 29.3 pg (ref 26.0–34.0)
MCHC: 33 g/dL (ref 30.0–36.0)
MCV: 88.8 fL (ref 80.0–100.0)
Platelets: 194 10*3/uL (ref 150–400)
RBC: 4.57 MIL/uL (ref 3.87–5.11)
RDW: 13.4 % (ref 11.5–15.5)
WBC: 3.2 10*3/uL — ABNORMAL LOW (ref 4.0–10.5)
nRBC: 0 % (ref 0.0–0.2)

## 2021-02-17 LAB — COMPREHENSIVE METABOLIC PANEL WITH GFR
ALT: 17 U/L (ref 0–44)
AST: 22 U/L (ref 15–41)
Albumin: 3.6 g/dL (ref 3.5–5.0)
Alkaline Phosphatase: 60 U/L (ref 38–126)
Anion gap: 10 (ref 5–15)
BUN: 10 mg/dL (ref 6–20)
CO2: 22 mmol/L (ref 22–32)
Calcium: 8.7 mg/dL — ABNORMAL LOW (ref 8.9–10.3)
Chloride: 104 mmol/L (ref 98–111)
Creatinine, Ser: 0.75 mg/dL (ref 0.44–1.00)
GFR, Estimated: >60 mL/min
Glucose, Bld: 90 mg/dL (ref 70–99)
Potassium: 3.4 mmol/L — ABNORMAL LOW (ref 3.5–5.1)
Sodium: 136 mmol/L (ref 135–145)
Total Bilirubin: 0.9 mg/dL (ref 0.3–1.2)
Total Protein: 7.1 g/dL (ref 6.5–8.1)

## 2021-02-17 LAB — HCG, QUANTITATIVE, PREGNANCY: hCG, Beta Chain, Quant, S: 15402 m[IU]/mL — ABNORMAL HIGH

## 2021-02-17 MED ORDER — SODIUM CHLORIDE 0.9 % IV SOLN
25.0000 mg | Freq: Once | INTRAVENOUS | Status: AC
  Administered 2021-02-17: 25 mg via INTRAVENOUS
  Filled 2021-02-17: qty 1

## 2021-02-17 MED ORDER — LACTATED RINGERS IV BOLUS
1000.0000 mL | Freq: Once | INTRAVENOUS | Status: AC
  Administered 2021-02-17: 1000 mL via INTRAVENOUS

## 2021-02-17 NOTE — MAU Note (Signed)
Robin Miller is a 30 y.o. at [redacted]w[redacted]d here in MAU reporting: here for follow up hcg. Having ongoing pain, states it has gotten better. No bleeding.  Onset of complaint: ongoing  Pain score: 7/10  Vitals:   02/17/21 1650  BP: 121/85  Pulse: 90  Resp: 16  Temp: 98.1 F (36.7 C)  SpO2: 98%     Lab orders placed from triage: hcg

## 2021-02-17 NOTE — MAU Provider Note (Signed)
Robin Miller  is a 30 y.o. G3P2002 at [redacted]w[redacted]d who presents to MAU today for follow-up quant hCG after 48 hours. The patient was seen in MAU on 02/15/21 and had quant hCG of 9955 and US showed probable IUGS and no YS or FP. She was also dx with Covid that day. Denies respiratory sx. She reports daily emesis, fatigue, and weakness since she was dx. She endorses RMQ and right LBP. Rates 7/10. Has not treated. Denies VB or fever. She is using Phenergan prn.  OB History  Gravida Para Term Preterm AB Living  3 2 2     2   SAB IAB Ectopic Multiple Live Births        0 2    # Outcome Date GA Lbr Len/2nd Weight Sex Delivery Anes PTL Lv  3 Current           2 Term 07/12/19 [redacted]w[redacted]d  3645 g M CS-LTranv Spinal  LIV  1 Term 03/16/17 [redacted]w[redacted]d  4005 g F CS-LTranv EPI  LIV    Past Medical History:  Diagnosis Date   Anemia    Cancer ([place])    basal cell back and face   Crohn's disease with fistula & stenosis of ileocecal valve s/p ileocectomy 03/16/2012 03/17/2012   Crohn's ileocolitis ([place]) 11/04/2011   Depression    Erosive esophagitis    LA Class B   GERD (gastroesophageal reflux disease)    Nausea vomiting and diarrhea    Vasovagal syncope     ROS: No VB No fever + abd pain +N/V +back pain   BP 121/85 (BP Location: Right Arm)   Pulse 90   Temp 98.1 F (36.7 C) (Oral)   Resp 16   LMP 01/05/2021 (Exact Date)   SpO2 98% Comment: room air  CONSTITUTIONAL: Well-developed, well-nourished female in no acute distress.  MUSCULOSKELETAL: Normal range of motion.  CARDIOVASCULAR: Regular heart rate RESPIRATORY: Normal effort ABD: +TTP RMQ, no rebound, no guarding NEUROLOGICAL: Alert and oriented to person, place, and time.  SKIN: Not diaphoretic. No erythema. No pallor. PSYCH: Normal mood and affect. Normal behavior. Normal judgment and thought content.  Results for orders placed or performed during the hospital encounter of 02/17/21 (from the past 24 hour(s))  hCG, quantitative, pregnancy      Status: Abnormal   Collection Time: 02/17/21  4:53 PM  Result Value Ref Range   hCG, Beta Chain, Quant, S 15,402 (H) <5 mIU/mL  CBC     Status: Abnormal   Collection Time: 02/17/21  7:24 PM  Result Value Ref Range   WBC 3.2 (L) 4.0 - 10.5 K/uL   RBC 4.57 3.87 - 5.11 MIL/uL   Hemoglobin 13.4 12.0 - 15.0 g/dL   HCT 40.6 36.0 - 46.0 %   MCV 88.8 80.0 - 100.0 fL   MCH 29.3 26.0 - 34.0 pg   MCHC 33.0 30.0 - 36.0 g/dL   RDW 13.4 11.5 - 15.5 %   Platelets 194 150 - 400 K/uL   nRBC 0.0 0.0 - 0.2 %  Comprehensive metabolic panel     Status: Abnormal   Collection Time: 02/17/21  7:24 PM  Result Value Ref Range   Sodium 136 135 - 145 mmol/L   Potassium 3.4 (L) 3.5 - 5.1 mmol/L   Chloride 104 98 - 111 mmol/L   CO2 22 22 - 32 mmol/L   Glucose, Bld 90 70 - 99 mg/dL   BUN 10 6 - 20 mg/dL   Creatinine, Ser 0.75  0.44 - 1.00 mg/dL   Calcium 8.7 (L) 8.9 - 10.3 mg/dL   Total Protein 7.1 6.5 - 8.1 g/dL   Albumin 3.6 3.5 - 5.0 g/dL   AST 22 15 - 41 U/L   ALT 17 0 - 44 U/L   Alkaline Phosphatase 60 38 - 126 U/L   Total Bilirubin 0.9 0.3 - 1.2 mg/dL   GFR, Estimated >60 >60 mL/min   Anion gap 10 5 - 15    US OB Transvaginal  Result Date: 02/17/2021 CLINICAL DATA:  Pregnancy of unknown location EXAM: TRANSVAGINAL OB ULTRASOUND TECHNIQUE: Transvaginal ultrasound was performed for complete evaluation of the gestation as well as the maternal uterus, adnexal regions, and pelvic cul-de-sac. COMPARISON:  None. FINDINGS: Intrauterine gestational sac: Single Yolk sac:  Not Visualized. Embryo:  Not Visualized. Cardiac Activity: Not Visualized. Heart Rate:  bpm MSD: 9.6 mm   5 w   5 d CRL:     mm    w  d                  [place] EDC: Subchorionic hemorrhage:  None visualized. Maternal uterus/adnexae: No adnexal mass or free fluid. IMPRESSION: Probable early intrauterine gestational sac, but no yolk sac, fetal pole, or cardiac activity yet visualized. Recommend follow-up quantitative B-HCG levels and follow-up US in  14 days to assess viability. This recommendation follows SRU consensus guidelines: Diagnostic Criteria for Nonviable Pregnancy Early in the First Trimester. [place] Med 2013; 395:3202-33. Electronically Signed   By: Rolm Baptise M.D.   On: 02/17/2021 19:21    MAU course: LR Phenergan  MDM: Labs and [place] ordered and reviewed. Feeling better after IVF and Phenergan. No further emesis. Tolerating po. Consult with Dr. Harolyn Rutherford regarding qhcg and [place], plan for outpt f/u. Dr. Brien Mates notified of presentation, clinical findings, and need for f/u. Stable for discharge home.   A: 1. Pregnancy of unknown anatomic location   2. COVID-19 virus infection   3. Hypokalemia     P: Discharge home First trimester/ectopic precautions discussed Follow up at [facility]- office will call pt Patient may return to MAU as needed or if her condition were to change or worsen   Allergies as of 02/17/2021       Reactions   Prednisone Nausea Only, Other (See Comments), Tinitus   Blurred vision,insomnia, shaky, body convulsions,        Medication List     STOP taking these medications    mesalamine 0.375 g 24 hr capsule Commonly known as: APRISO       TAKE these medications    cyclobenzaprine 10 MG tablet Commonly known as: FLEXERIL Take 1 tablet (10 mg total) by mouth 2 (two) times daily as needed for muscle spasms.   esomeprazole 20 MG capsule Commonly known as: NEXIUM Take 20 mg by mouth daily at 12 noon.   prenatal multivitamin Tabs tablet Take 1 tablet by mouth daily at 12 noon.   promethazine 12.5 MG tablet Commonly known as: PHENERGAN Take 1-2 tablets (12.5-25 mg total) by mouth every 6 (six) hours as needed for nausea or vomiting.   sertraline 50 MG tablet Commonly known as: ZOLOFT Take 50 mg by mouth at bedtime.        Julianne Handler, CNM 02/17/2021 9:12 PM

## 2021-02-27 LAB — OB RESULTS CONSOLE GC/CHLAMYDIA
Chlamydia: NEGATIVE
Neisseria Gonorrhea: NEGATIVE

## 2021-03-28 LAB — OB RESULTS CONSOLE ANTIBODY SCREEN: Antibody Screen: NEGATIVE

## 2021-03-28 LAB — HEPATITIS C ANTIBODY: HCV Ab: NEGATIVE

## 2021-03-28 LAB — OB RESULTS CONSOLE HIV ANTIBODY (ROUTINE TESTING): HIV: NONREACTIVE

## 2021-03-28 LAB — OB RESULTS CONSOLE RUBELLA ANTIBODY, IGM: Rubella: IMMUNE

## 2021-03-28 LAB — OB RESULTS CONSOLE RPR: RPR: NONREACTIVE

## 2021-03-28 LAB — OB RESULTS CONSOLE HEPATITIS B SURFACE ANTIGEN: Hepatitis B Surface Ag: NEGATIVE

## 2021-05-21 ENCOUNTER — Other Ambulatory Visit: Payer: Self-pay

## 2021-05-21 ENCOUNTER — Other Ambulatory Visit: Payer: Self-pay | Admitting: Obstetrics and Gynecology

## 2021-05-21 DIAGNOSIS — Z363 Encounter for antenatal screening for malformations: Secondary | ICD-10-CM

## 2021-05-24 ENCOUNTER — Ambulatory Visit: Payer: Commercial Managed Care - PPO | Attending: Obstetrics and Gynecology

## 2021-05-24 ENCOUNTER — Ambulatory Visit: Payer: Commercial Managed Care - PPO | Admitting: *Deleted

## 2021-05-24 ENCOUNTER — Other Ambulatory Visit: Payer: Self-pay

## 2021-05-24 ENCOUNTER — Encounter: Payer: Self-pay | Admitting: *Deleted

## 2021-05-24 ENCOUNTER — Other Ambulatory Visit: Payer: Self-pay | Admitting: *Deleted

## 2021-05-24 VITALS — BP 107/71 | HR 90

## 2021-05-24 DIAGNOSIS — Z6832 Body mass index (BMI) 32.0-32.9, adult: Secondary | ICD-10-CM

## 2021-05-24 DIAGNOSIS — Z6831 Body mass index (BMI) 31.0-31.9, adult: Secondary | ICD-10-CM | POA: Insufficient documentation

## 2021-05-24 DIAGNOSIS — O34219 Maternal care for unspecified type scar from previous cesarean delivery: Secondary | ICD-10-CM

## 2021-05-24 DIAGNOSIS — Z363 Encounter for antenatal screening for malformations: Secondary | ICD-10-CM | POA: Insufficient documentation

## 2021-05-24 DIAGNOSIS — Z98891 History of uterine scar from previous surgery: Secondary | ICD-10-CM

## 2021-06-21 ENCOUNTER — Ambulatory Visit: Payer: Commercial Managed Care - PPO | Admitting: *Deleted

## 2021-06-21 ENCOUNTER — Encounter: Payer: Self-pay | Admitting: *Deleted

## 2021-06-21 ENCOUNTER — Other Ambulatory Visit: Payer: Self-pay | Admitting: *Deleted

## 2021-06-21 ENCOUNTER — Ambulatory Visit: Payer: Commercial Managed Care - PPO | Attending: Obstetrics and Gynecology

## 2021-06-21 ENCOUNTER — Other Ambulatory Visit: Payer: Self-pay

## 2021-06-21 VITALS — BP 116/72 | HR 83

## 2021-06-21 DIAGNOSIS — Z3689 Encounter for other specified antenatal screening: Secondary | ICD-10-CM

## 2021-06-21 DIAGNOSIS — Z362 Encounter for other antenatal screening follow-up: Secondary | ICD-10-CM

## 2021-06-21 DIAGNOSIS — Z3A23 23 weeks gestation of pregnancy: Secondary | ICD-10-CM | POA: Diagnosis not present

## 2021-06-21 DIAGNOSIS — Z98891 History of uterine scar from previous surgery: Secondary | ICD-10-CM | POA: Insufficient documentation

## 2021-06-21 DIAGNOSIS — O34219 Maternal care for unspecified type scar from previous cesarean delivery: Secondary | ICD-10-CM | POA: Insufficient documentation

## 2021-06-21 DIAGNOSIS — Z6832 Body mass index (BMI) 32.0-32.9, adult: Secondary | ICD-10-CM | POA: Insufficient documentation

## 2021-06-21 DIAGNOSIS — O99212 Obesity complicating pregnancy, second trimester: Secondary | ICD-10-CM

## 2021-08-16 ENCOUNTER — Ambulatory Visit: Payer: Commercial Managed Care - PPO

## 2021-09-24 ENCOUNTER — Encounter (HOSPITAL_COMMUNITY): Payer: Self-pay

## 2021-09-24 NOTE — Patient Instructions (Signed)
Robin Miller  09/24/2021   Your procedure is scheduled on:  10/08/2021  Arrive at 74 at [employer] [address] on [address] at [facility]  and [employer]. You are invited to use the FREE valet parking or use the Visitor's parking deck.  Pick up the phone at the desk and dial [phone].  Call this number if you have problems the morning of surgery: [phone]  Remember:   Do not eat food:(After Midnight) Desps de medianoche.  Do not drink clear liquids: (After Midnight) Desps de medianoche.  Take these medicines the morning of surgery with A SIP OF WATER:  May take nexium as prescribed   Do not wear jewelry, make-up or nail polish.  Do not wear lotions, powders, or perfumes. Do not wear deodorant.  Do not shave 48 hours prior to surgery.  Do not bring valuables to the hospital.  [facility] is not   responsible for any belongings or valuables brought to the hospital.  Contacts, dentures or bridgework may not be worn into surgery.  Leave suitcase in the car. After surgery it may be brought to your room.  For patients admitted to the hospital, checkout time is 11:00 AM the day of              discharge.      Please read over the following fact sheets that you were given:     Preparing for Surgery

## 2021-10-04 ENCOUNTER — Encounter (HOSPITAL_COMMUNITY): Payer: Commercial Managed Care - PPO

## 2021-10-04 NOTE — Patient Instructions (Signed)
Robin Miller  10/04/2021   Your procedure is scheduled on:  10/08/2021  Arrive at 30 at Entrance C on [address] at [facility]  and [employer]. You are invited to use the FREE valet parking or use the Visitor's parking deck.  Pick up the phone at the desk and dial [phone].  Call this number if you have problems the morning of surgery: [phone]  Remember:   Do not eat food:(After Midnight) Desps de medianoche.  Do not drink clear liquids: (4 Hours before arrival) 4 horas ante llegada.  Take these medicines the morning of surgery with A SIP OF WATER:  none   Do not wear jewelry, make-up or nail polish.  Do not wear lotions, powders, or perfumes. Do not wear deodorant.  Do not shave 48 hours prior to surgery.  Do not bring valuables to the hospital.  [facility] is not   responsible for any belongings or valuables brought to the hospital.  Contacts, dentures or bridgework may not be worn into surgery.  Leave suitcase in the car. After surgery it may be brought to your room.  For patients admitted to the hospital, checkout time is 11:00 AM the day of              discharge.      Please read over the following fact sheets that you were given:     Preparing for Surgery

## 2021-10-07 ENCOUNTER — Encounter (HOSPITAL_COMMUNITY)
Admission: RE | Admit: 2021-10-07 | Discharge: 2021-10-07 | Disposition: A | Discharge status: Home / Self Care | Payer: Commercial Managed Care - PPO | Source: Ambulatory Visit | Attending: Obstetrics and Gynecology | Admitting: Obstetrics and Gynecology

## 2021-10-07 ENCOUNTER — Encounter (HOSPITAL_COMMUNITY): Payer: Self-pay | Admitting: Obstetrics and Gynecology

## 2021-10-07 ENCOUNTER — Ambulatory Visit (HOSPITAL_COMMUNITY)
Admission: RE | Admit: 2021-10-07 | Discharge: 2021-10-07 | Disposition: A | Discharge status: Home / Self Care | Payer: Commercial Managed Care - PPO | Source: Ambulatory Visit | Attending: Obstetrics and Gynecology | Admitting: Obstetrics and Gynecology

## 2021-10-07 DIAGNOSIS — Z01812 Encounter for preprocedural laboratory examination: Secondary | ICD-10-CM | POA: Insufficient documentation

## 2021-10-07 LAB — CBC
HCT: 32.9 % — ABNORMAL LOW (ref 36.0–46.0)
Hemoglobin: 10.2 g/dL — ABNORMAL LOW (ref 12.0–15.0)
MCH: 25.6 pg — ABNORMAL LOW (ref 26.0–34.0)
MCHC: 31 g/dL (ref 30.0–36.0)
MCV: 82.7 fL (ref 80.0–100.0)
Platelets: 301 10*3/uL (ref 150–400)
RBC: 3.98 MIL/uL (ref 3.87–5.11)
RDW: 15.5 % (ref 11.5–15.5)
WBC: 7.1 10*3/uL (ref 4.0–10.5)
nRBC: 0 % (ref 0.0–0.2)

## 2021-10-07 LAB — RPR: RPR Ser Ql: NONREACTIVE

## 2021-10-07 NOTE — Anesthesia Preprocedure Evaluation (Addendum)
Anesthesia Evaluation  Patient identified by MRN, date of birth, ID band Patient awake    Reviewed: Allergy & Precautions, NPO status , Patient's Chart, lab work & pertinent test results  Airway Mallampati: II  TM Distance: >3 FB Neck ROM: Full    Dental no notable dental hx. (+) Teeth Intact, Dental Advisory Given   Pulmonary neg pulmonary ROS,    Pulmonary exam normal breath sounds clear to auscultation       Cardiovascular negative cardio ROS Normal cardiovascular exam Rhythm:Regular Rate:Normal     Neuro/Psych PSYCHIATRIC DISORDERS Depression negative neurological ROS     GI/Hepatic Neg liver ROS, GERD  Controlled and Medicated,Hx/o Crohn's disease S/P ileocectomy   Endo/Other  Obesity  Renal/GU Renal diseaseHx/o renal calculi  negative genitourinary   Musculoskeletal negative musculoskeletal ROS (+)   Abdominal (+) + obese,   Peds  Hematology  (+) Blood dyscrasia, anemia ,   Anesthesia Other Findings   Reproductive/Obstetrics (+) Pregnancy Previous C/Section x 2                            Anesthesia Physical Anesthesia Plan  ASA: 2  Anesthesia Plan: Spinal and Combined Spinal and Epidural   Post-op Pain Management: Minimal or no pain anticipated and Regional block*   Induction:   PONV Risk Score and Plan: 4 or greater and Treatment may vary due to age or medical condition, Ondansetron and Scopolamine patch - Pre-op  Airway Management Planned: Natural Airway  Additional Equipment: None  Intra-op Plan:   Post-operative Plan:   Informed Consent: I have reviewed the patients History and Physical, chart, labs and discussed the procedure including the risks, benefits and alternatives for the proposed anesthesia with the patient or authorized representative who has indicated his/her understanding and acceptance.     Dental advisory given  Plan Discussed with: CRNA and  Anesthesiologist  Anesthesia Plan Comments:        Anesthesia Quick Evaluation

## 2021-10-08 ENCOUNTER — Inpatient Hospital Stay (HOSPITAL_COMMUNITY): Payer: Commercial Managed Care - PPO | Admitting: Anesthesiology

## 2021-10-08 ENCOUNTER — Encounter (HOSPITAL_COMMUNITY)
Admission: AD | Disposition: A | Discharge status: Home / Self Care | Payer: Self-pay | Source: Home / Self Care | Attending: Obstetrics and Gynecology

## 2021-10-08 ENCOUNTER — Other Ambulatory Visit: Payer: Self-pay

## 2021-10-08 ENCOUNTER — Encounter (HOSPITAL_COMMUNITY): Payer: Self-pay | Admitting: Obstetrics and Gynecology

## 2021-10-08 ENCOUNTER — Inpatient Hospital Stay (HOSPITAL_COMMUNITY)
Admission: AD | Admit: 2021-10-08 | Discharge: 2021-10-11 | DRG: 787 | Disposition: A | Discharge status: Home / Self Care | Payer: Commercial Managed Care - PPO | Attending: Obstetrics and Gynecology | Admitting: Obstetrics and Gynecology

## 2021-10-08 DIAGNOSIS — K5190 Ulcerative colitis, unspecified, without complications: Secondary | ICD-10-CM | POA: Diagnosis present

## 2021-10-08 DIAGNOSIS — Z3490 Encounter for supervision of normal pregnancy, unspecified, unspecified trimester: Secondary | ICD-10-CM

## 2021-10-08 DIAGNOSIS — O99214 Obesity complicating childbirth: Secondary | ICD-10-CM | POA: Diagnosis present

## 2021-10-08 DIAGNOSIS — D62 Acute posthemorrhagic anemia: Secondary | ICD-10-CM | POA: Diagnosis not present

## 2021-10-08 DIAGNOSIS — O9962 Diseases of the digestive system complicating childbirth: Secondary | ICD-10-CM | POA: Diagnosis present

## 2021-10-08 DIAGNOSIS — O9081 Anemia of the puerperium: Secondary | ICD-10-CM | POA: Diagnosis not present

## 2021-10-08 DIAGNOSIS — O34211 Maternal care for low transverse scar from previous cesarean delivery: Secondary | ICD-10-CM | POA: Diagnosis present

## 2021-10-08 DIAGNOSIS — Z3A39 39 weeks gestation of pregnancy: Secondary | ICD-10-CM | POA: Diagnosis not present

## 2021-10-08 LAB — PREPARE RBC (CROSSMATCH)

## 2021-10-08 SURGERY — Surgical Case
Anesthesia: Spinal

## 2021-10-08 MED ORDER — LIDOCAINE HCL (PF) 1 % IJ SOLN
INTRAMUSCULAR | Status: AC
  Filled 2021-10-08: qty 5

## 2021-10-08 MED ORDER — FENTANYL CITRATE (PF) 100 MCG/2ML IJ SOLN
INTRAMUSCULAR | Status: AC
  Filled 2021-10-08: qty 2

## 2021-10-08 MED ORDER — COCONUT OIL OIL
1.0000 | TOPICAL_OIL | Status: DC | PRN
Start: 2021-10-08 — End: 2021-10-11

## 2021-10-08 MED ORDER — PRENATAL MULTIVITAMIN CH
1.0000 | ORAL_TABLET | Freq: Every day | ORAL | Status: DC
  Administered 2021-10-09 – 2021-10-10 (×2): 1 via ORAL
  Filled 2021-10-08 (×2): qty 1

## 2021-10-08 MED ORDER — LACTATED RINGERS IV SOLN: INTRAVENOUS | Status: DC

## 2021-10-08 MED ORDER — PHENYLEPHRINE HCL-NACL 20-0.9 MG/250ML-% IV SOLN
INTRAVENOUS | Status: AC
  Filled 2021-10-08: qty 250

## 2021-10-08 MED ORDER — KETOROLAC TROMETHAMINE 30 MG/ML IJ SOLN: 30.0000 mg | Freq: Four times a day (QID) | INTRAMUSCULAR | Status: AC | PRN

## 2021-10-08 MED ORDER — SODIUM CHLORIDE 0.9% IV SOLUTION: Freq: Once | INTRAVENOUS | Status: DC

## 2021-10-08 MED ORDER — ONDANSETRON HCL 4 MG/2ML IJ SOLN
INTRAMUSCULAR | Status: AC
  Filled 2021-10-08: qty 2

## 2021-10-08 MED ORDER — DIPHENHYDRAMINE HCL 50 MG/ML IJ SOLN: 12.5000 mg | INTRAMUSCULAR | Status: DC | PRN

## 2021-10-08 MED ORDER — FENTANYL CITRATE (PF) 100 MCG/2ML IJ SOLN
25.0000 ug | INTRAMUSCULAR | Status: DC | PRN
  Administered 2021-10-08: 50 ug via INTRAVENOUS

## 2021-10-08 MED ORDER — NALOXONE HCL 4 MG/10ML IJ SOLN: 1.0000 ug/kg/h | INTRAVENOUS | Status: DC | PRN

## 2021-10-08 MED ORDER — OXYCODONE HCL 5 MG PO TABS
5.0000 mg | ORAL_TABLET | ORAL | Status: DC | PRN
  Administered 2021-10-09 (×2): 5 mg via ORAL
  Administered 2021-10-09 – 2021-10-10 (×4): 10 mg via ORAL
  Administered 2021-10-10: 5 mg via ORAL
  Administered 2021-10-10 (×2): 10 mg via ORAL
  Administered 2021-10-11 (×2): 5 mg via ORAL
  Filled 2021-10-08 (×3): qty 1
  Filled 2021-10-08 (×5): qty 2
  Filled 2021-10-08: qty 1
  Filled 2021-10-08: qty 2
  Filled 2021-10-08: qty 1

## 2021-10-08 MED ORDER — MORPHINE SULFATE (PF) 0.5 MG/ML IJ SOLN
INTRAMUSCULAR | Status: DC | PRN
  Administered 2021-10-08: .15 mg via INTRATHECAL

## 2021-10-08 MED ORDER — MORPHINE SULFATE (PF) 0.5 MG/ML IJ SOLN
INTRAMUSCULAR | Status: AC
  Filled 2021-10-08: qty 10

## 2021-10-08 MED ORDER — ONDANSETRON HCL 4 MG/2ML IJ SOLN
4.0000 mg | Freq: Three times a day (TID) | INTRAMUSCULAR | Status: DC | PRN
  Administered 2021-10-08: 4 mg via INTRAVENOUS
  Filled 2021-10-08: qty 2

## 2021-10-08 MED ORDER — ZOLPIDEM TARTRATE 5 MG PO TABS: 5.0000 mg | ORAL_TABLET | Freq: Every evening | ORAL | Status: DC | PRN

## 2021-10-08 MED ORDER — DIPHENHYDRAMINE HCL 25 MG PO CAPS: 25.0000 mg | ORAL_CAPSULE | ORAL | Status: DC | PRN

## 2021-10-08 MED ORDER — SIMETHICONE 80 MG PO CHEW
80.0000 mg | CHEWABLE_TABLET | Freq: Three times a day (TID) | ORAL | Status: DC
  Administered 2021-10-09 – 2021-10-11 (×6): 80 mg via ORAL
  Filled 2021-10-08 (×6): qty 1

## 2021-10-08 MED ORDER — PHENYLEPHRINE HCL-NACL 20-0.9 MG/250ML-% IV SOLN
INTRAVENOUS | Status: DC | PRN
  Administered 2021-10-08: 60 ug/min via INTRAVENOUS

## 2021-10-08 MED ORDER — BUPIVACAINE IN DEXTROSE 0.75-8.25 % IT SOLN
INTRATHECAL | Status: DC | PRN
  Administered 2021-10-08: 1.6 mL via INTRATHECAL

## 2021-10-08 MED ORDER — SIMETHICONE 80 MG PO CHEW: 80.0000 mg | CHEWABLE_TABLET | ORAL | Status: DC | PRN

## 2021-10-08 MED ORDER — ACETAMINOPHEN 10 MG/ML IV SOLN
INTRAVENOUS | Status: AC
  Filled 2021-10-08: qty 100

## 2021-10-08 MED ORDER — DIPHENHYDRAMINE HCL 25 MG PO CAPS: 25.0000 mg | ORAL_CAPSULE | Freq: Four times a day (QID) | ORAL | Status: DC | PRN

## 2021-10-08 MED ORDER — WITCH HAZEL-GLYCERIN EX PADS: 1.0000 "application " | MEDICATED_PAD | CUTANEOUS | Status: DC | PRN

## 2021-10-08 MED ORDER — MENTHOL 3 MG MT LOZG: 1.0000 | LOZENGE | OROMUCOSAL | Status: DC | PRN

## 2021-10-08 MED ORDER — STERILE WATER FOR IRRIGATION IR SOLN
Status: DC | PRN
  Administered 2021-10-08: 1

## 2021-10-08 MED ORDER — SODIUM CHLORIDE 0.9 % IR SOLN
Status: DC | PRN
  Administered 2021-10-08: 1

## 2021-10-08 MED ORDER — MEPERIDINE HCL 25 MG/ML IJ SOLN: 6.2500 mg | INTRAMUSCULAR | Status: DC | PRN

## 2021-10-08 MED ORDER — SOD CITRATE-CITRIC ACID 500-334 MG/5ML PO SOLN
30.0000 mL | ORAL | Status: AC
  Administered 2021-10-08: 30 mL via ORAL

## 2021-10-08 MED ORDER — SENNOSIDES-DOCUSATE SODIUM 8.6-50 MG PO TABS
2.0000 | ORAL_TABLET | Freq: Every day | ORAL | Status: DC
  Administered 2021-10-09 – 2021-10-11 (×3): 2 via ORAL
  Filled 2021-10-08 (×3): qty 2

## 2021-10-08 MED ORDER — PHENYLEPHRINE 80 MCG/ML (10ML) SYRINGE FOR IV PUSH (FOR BLOOD PRESSURE SUPPORT)
PREFILLED_SYRINGE | INTRAVENOUS | Status: AC
  Filled 2021-10-08: qty 20

## 2021-10-08 MED ORDER — SODIUM CHLORIDE 0.9% FLUSH: 3.0000 mL | INTRAVENOUS | Status: DC | PRN

## 2021-10-08 MED ORDER — TETANUS-DIPHTH-ACELL PERTUSSIS 5-2.5-18.5 LF-MCG/0.5 IM SUSY: 0.5000 mL | PREFILLED_SYRINGE | Freq: Once | INTRAMUSCULAR | Status: DC

## 2021-10-08 MED ORDER — OXYTOCIN-SODIUM CHLORIDE 30-0.9 UT/500ML-% IV SOLN: 2.5000 [IU]/h | INTRAVENOUS | Status: AC

## 2021-10-08 MED ORDER — SOD CITRATE-CITRIC ACID 500-334 MG/5ML PO SOLN
ORAL | Status: AC
  Filled 2021-10-08: qty 30

## 2021-10-08 MED ORDER — DIBUCAINE (PERIANAL) 1 % EX OINT: 1.0000 "application " | TOPICAL_OINTMENT | CUTANEOUS | Status: DC | PRN

## 2021-10-08 MED ORDER — ACETAMINOPHEN 10 MG/ML IV SOLN
INTRAVENOUS | Status: DC | PRN
  Administered 2021-10-08: 1000 mg via INTRAVENOUS

## 2021-10-08 MED ORDER — CEFAZOLIN SODIUM-DEXTROSE 2-4 GM/100ML-% IV SOLN
INTRAVENOUS | Status: AC
  Filled 2021-10-08: qty 100

## 2021-10-08 MED ORDER — CEFAZOLIN SODIUM-DEXTROSE 2-4 GM/100ML-% IV SOLN
2.0000 g | INTRAVENOUS | Status: AC
  Administered 2021-10-08: 2 g via INTRAVENOUS

## 2021-10-08 MED ORDER — BUPIVACAINE IN DEXTROSE 0.75-8.25 % IT SOLN
INTRATHECAL | Status: AC
  Filled 2021-10-08: qty 2

## 2021-10-08 MED ORDER — FENTANYL CITRATE (PF) 100 MCG/2ML IJ SOLN
INTRAMUSCULAR | Status: DC | PRN
Start: 2021-10-08 — End: 2021-10-08
  Administered 2021-10-08: 15 ug via INTRATHECAL

## 2021-10-08 MED ORDER — IBUPROFEN 600 MG PO TABS
600.0000 mg | ORAL_TABLET | Freq: Four times a day (QID) | ORAL | Status: DC
  Administered 2021-10-08 – 2021-10-11 (×11): 600 mg via ORAL
  Filled 2021-10-08 (×10): qty 1

## 2021-10-08 MED ORDER — ONDANSETRON HCL 4 MG/2ML IJ SOLN
INTRAMUSCULAR | Status: DC | PRN
  Administered 2021-10-08: 4 mg via INTRAVENOUS

## 2021-10-08 MED ORDER — LIDOCAINE HCL 1 % IJ SOLN
INTRAMUSCULAR | Status: AC
  Filled 2021-10-08: qty 20

## 2021-10-08 MED ORDER — ACETAMINOPHEN 325 MG PO TABS
650.0000 mg | ORAL_TABLET | ORAL | Status: DC | PRN
  Administered 2021-10-09 – 2021-10-11 (×8): 650 mg via ORAL
  Filled 2021-10-08 (×9): qty 2

## 2021-10-08 MED ORDER — PHENYLEPHRINE HCL (PRESSORS) 10 MG/ML IV SOLN
INTRAVENOUS | Status: DC | PRN
  Administered 2021-10-08 (×7): 80 ug via INTRAVENOUS
  Administered 2021-10-08: 160 ug via INTRAVENOUS

## 2021-10-08 MED ORDER — OXYTOCIN-SODIUM CHLORIDE 30-0.9 UT/500ML-% IV SOLN
INTRAVENOUS | Status: AC
  Filled 2021-10-08: qty 500

## 2021-10-08 MED ORDER — OXYTOCIN-SODIUM CHLORIDE 30-0.9 UT/500ML-% IV SOLN
INTRAVENOUS | Status: DC | PRN
  Administered 2021-10-08: 200 mL via INTRAVENOUS

## 2021-10-08 MED ORDER — NALOXONE HCL 0.4 MG/ML IJ SOLN: 0.4000 mg | INTRAMUSCULAR | Status: DC | PRN

## 2021-10-08 SURGICAL SUPPLY — 35 items
BENZOIN TINCTURE PRP APPL 2/3 (GAUZE/BANDAGES/DRESSINGS) ×1 IMPLANT
CHLORAPREP W/TINT 26ML (MISCELLANEOUS) ×4 IMPLANT
CLAMP CORD UMBIL (MISCELLANEOUS) ×2 IMPLANT
CLIP FILSHIE TUBAL LIGA STRL (Clip) IMPLANT
CLOTH BEACON ORANGE TIMEOUT ST (SAFETY) ×2 IMPLANT
DRSG OPSITE POSTOP 4X10 (GAUZE/BANDAGES/DRESSINGS) ×2 IMPLANT
ELECT REM PT RETURN 9FT ADLT (ELECTROSURGICAL) ×2
ELECTRODE REM PT RTRN 9FT ADLT (ELECTROSURGICAL) ×1 IMPLANT
EXTRACTOR VACUUM M CUP 4 TUBE (SUCTIONS) IMPLANT
GAUZE SPONGE 4X4 12PLY STRL LF (GAUZE/BANDAGES/DRESSINGS) ×2 IMPLANT
GLOVE BIOGEL M 6.5 STRL (GLOVE) ×2 IMPLANT
GLOVE BIOGEL PI IND STRL 6.5 (GLOVE) ×1 IMPLANT
GLOVE BIOGEL PI IND STRL 7.0 (GLOVE) ×2 IMPLANT
GLOVE BIOGEL PI INDICATOR 6.5 (GLOVE) ×1
GLOVE BIOGEL PI INDICATOR 7.0 (GLOVE) ×2
GOWN STRL REUS W/TWL LRG LVL3 (GOWN DISPOSABLE) ×4 IMPLANT
KIT ABG SYR 3ML LUER SLIP (SYRINGE) IMPLANT
NDL HYPO 25X5/8 SAFETYGLIDE (NEEDLE) IMPLANT
NEEDLE HYPO 25X5/8 SAFETYGLIDE (NEEDLE) IMPLANT
NS IRRIG 1000ML POUR BTL (IV SOLUTION) ×2 IMPLANT
PACK C SECTION WH (CUSTOM PROCEDURE TRAY) ×2 IMPLANT
PAD ABD 7.5X8 STRL (GAUZE/BANDAGES/DRESSINGS) ×1 IMPLANT
PAD ABD DERMACEA PRESS 5X9 (GAUZE/BANDAGES/DRESSINGS) ×1 IMPLANT
PAD OB MATERNITY 4.3X12.25 (PERSONAL CARE ITEMS) ×2 IMPLANT
RTRCTR C-SECT PINK 25CM LRG (MISCELLANEOUS) ×2 IMPLANT
STRIP SURGICAL 1/4 X 6 IN (GAUZE/BANDAGES/DRESSINGS) ×1 IMPLANT
SUT MON AB 2-0 CT1 27 (SUTURE) ×2 IMPLANT
SUT PDS AB 0 CTX 60 (SUTURE) IMPLANT
SUT PLAIN 2 0 XLH (SUTURE) ×1 IMPLANT
SUT VIC AB 0 CTX 36 (SUTURE) ×4
SUT VIC AB 0 CTX36XBRD ANBCTRL (SUTURE) ×4 IMPLANT
SUT VIC AB 4-0 KS 27 (SUTURE) ×2 IMPLANT
TOWEL OR 17X24 6PK STRL BLUE (TOWEL DISPOSABLE) ×2 IMPLANT
TRAY FOLEY W/BAG SLVR 14FR LF (SET/KITS/TRAYS/PACK) ×2 IMPLANT
WATER STERILE IRR 1000ML POUR (IV SOLUTION) ×2 IMPLANT

## 2021-10-08 NOTE — Anesthesia Postprocedure Evaluation (Signed)
Anesthesia Post Note  Patient: Robin Miller  Procedure(s) Performed: REPEAT CESAREAN SECTION     Patient location during evaluation: PACU Anesthesia Type: Spinal Level of consciousness: oriented and awake and alert Pain management: pain level controlled Vital Signs Assessment: post-procedure vital signs reviewed and stable Respiratory status: spontaneous breathing, respiratory function stable and nonlabored ventilation Cardiovascular status: blood pressure returned to baseline and stable Postop Assessment: no headache, no backache, no apparent nausea or vomiting, spinal receding and patient able to bend at knees Anesthetic complications: no   No notable events documented.  Last Vitals:  Vitals:   10/08/21 1400 10/08/21 1415  BP: 117/69 126/77  Pulse: 79 81  Resp: 16 15  Temp:  36.4 C  SpO2: 98% 95%    Last Pain:  Vitals:   10/08/21 1415  PainSc: 0-No pain   Pain Goal: Patients Stated Pain Goal: 4 (10/08/21 1400)  LLE Motor Response: No movement due to regional block (10/08/21 1415) LLE Sensation: Tingling (10/08/21 1415) RLE Motor Response: No movement due to regional block (10/08/21 1415) RLE Sensation: Tingling (10/08/21 1415) L Sensory Level: L3-Anterior knee, lower leg (10/08/21 1415) R Sensory Level: L3-Anterior knee, lower leg (10/08/21 1415) Epidural/Spinal Function Cutaneous sensation: Tingles (10/08/21 1415), Patient able to flex knees: No (10/08/21 1415), Patient able to lift hips off bed: No (10/08/21 1415), Back pain beyond tenderness at insertion site: No (10/08/21 1415), Progressively worsening motor and/or sensory loss: No (10/08/21 1415), Bowel and/or bladder incontinence post epidural: No (10/08/21 1415)  Katia Hannen A.

## 2021-10-08 NOTE — Brief Op Note (Signed)
10/08/2021  2:14 PM  PATIENT:  Robin Miller  31 y.o. female  PRE-OPERATIVE DIAGNOSIS:  History of cesarean section x2  POST-OPERATIVE DIAGNOSIS:  History of cesarean section x2  PROCEDURE:  Procedure(s): REPEAT CESAREAN SECTION (N/A)- low transverse  SURGEON:  Surgeon(s) and Role:    * Allyn Kenner, DO - Primary    * Bobbye Charleston, MD - Assisting  ANESTHESIA:   spinal  EBL:  see anesthesia report   SPECIMEN:  Source of Specimen:  cord blood  DISPOSITION OF SPECIMEN:  N/A  COUNTS:  YES  PLAN OF CARE: Admit to inpatient   PATIENT DISPOSITION:  PACU - hemodynamically stable.   Delay start of Pharmacological VTE agent (>24hrs) due to surgical blood loss or risk of bleeding: not applicable

## 2021-10-08 NOTE — Anesthesia Procedure Notes (Signed)
Spinal  Patient location during procedure: OR Start time: 10/08/2021 12:15 PM End time: 10/08/2021 12:33 PM Reason for block: surgical anesthesia Staffing Performed: anesthesiologist  Anesthesiologist: Josephine Igo, MD Performed by: Josephine Igo, MD Authorized by: Josephine Igo, MD   Preanesthetic Checklist Completed: patient identified, IV checked, site marked, risks and benefits discussed, surgical consent, monitors and equipment checked, pre-op evaluation and timeout performed Spinal Block Patient position: sitting Prep: DuraPrep and site prepped and draped Patient monitoring: cardiac monitor, blood pressure, heart rate and continuous pulse ox Approach: midline Location: L3-4 Injection technique: catheter Needle Needle type: Tuohy and Spinocan  Needle gauge: 24 G Needle length: 12.7 cm Needle insertion depth: 9 cm Catheter type: closed end flexible Catheter size: 19 g Catheter at skin depth: 14 cm Assessment Sensory level: T4 Events: CSF return Additional Notes Epidural performed using LOR with air technique. No CSF, Heme or paresthesias. SAB performed through the epidural needle using 24ga Spinocan needle. CSF clear with free flow and no paresthesias. Local anesthetic and narcotics injected through the spinal needle and withdrawn. Epidural catheter threaded 5cm into the epidural space and the epidural needle was withdrawn. A sterile dressing was applied and the patient placed supine with LUD. The patient tolerated the procedure well and adequate sensory level was obtained. Technically difficult due to MO, poor position and difficult anatomy. Multiple attempts.

## 2021-10-08 NOTE — Lactation Note (Signed)
This note was copied from a baby's chart. Lactation Consultation Note  Patient Name: Robin Miller [id number] Date: 10/08/2021 Reason for consult: Initial assessment;Mother's request;Term;Breastfeeding assistance Age:32 hours Mom experienced with breastfeeding, latched infant 2x since on the floor. Last feeding at, 1600.   Plan 1 To feed based on cues 8-12x 24hr period. Mom to offer breasts and look for signs of milk transfer.  2. If unable to latch, Mom to hand express and offer colostrum on spoon, then try a latch.   All questions answered at the end of the visit.  Mom aware can for latch assistance from RN or [place] for next feeding.   Maternal Data Has patient been taught Hand Expression?: Yes Does the patient have breastfeeding experience prior to this delivery?: Yes How long did the patient breastfeed?: first child 6 months, second 1 year  Feeding Mother's Current Feeding Choice: Breast Milk  LATCH Score                    Lactation Tools Discussed/Used    Interventions Interventions: Breast feeding basics reviewed;Assisted with latch;Skin to skin;Hand express;Breast compression;Adjust position;Support pillows;Position options;Expressed milk;Education;Infant Driven Feeding Algorithm education  Discharge Pump: Personal  Consult Status Consult Status: Follow-up Date: 10/09/21 Follow-up type: In-patient    Robin Miller  [name] 10/08/2021, 6:19 PM

## 2021-10-08 NOTE — Op Note (Signed)
NAME: Robin Miller, BAZAR MEDICAL RECORD NO: [id number] ACCOUNT NO: [id number] DATE OF BIRTH: [birth year] FACILITY: [facility] LOCATION: [facility] PHYSICIAN: Jaclynn Major. Rogue Bussing, DO  Operative Report   DATE OF PROCEDURE: 10/08/2021  PREOPERATIVE DIAGNOSIS:  History of cesarean section x2.  POSTOPERATIVE DIAGNOSIS:  History of cesarean section x2.  PROCEDURE:  Low transverse cesarean section.  SURGEON:  Jaclynn Major. Rogue Bussing, DO.  ASSISTANT:  Bobbye Charleston, MD  ANESTHESIA:  Spinal.  ESTIMATED BLOOD LOSS:  See anesthesia report.  FINDINGS:  Thin filmy adhesions from bladder to lower uterine segment.  Bilateral tubes and ovaries appeared normal. Female infant in cephalic presentation with Apgars 9 and 9 and weight 8 pounds 9 ounces.  DESCRIPTION OF PROCEDURE:  The patient was taken to the operating room where anesthesia was administered and found to be adequate.  She was prepped and draped in the normal sterile fashion in dorsal supine position with a leftward tilt.  A scalpel was  used to make a Pfannenstiel skin incision, which was carried down to underlying layer of fascia with Bovie cautery.  The fascia was incised in the midline with the scalpel and extended laterally with Mayo scissors.  Kocher clamps were placed at the  superior aspect of the fascial incision.  Rectus muscles were dissected off bluntly and sharply.  Kocher clamps were then placed at the inferior aspect of the fascial incision.  Rectus muscles were again dissected off bluntly and sharply.  A hemostat was  used to separate rectus muscle superiorly and with good visualization of the peritoneum it was entered bluntly.  Manual lateral traction was performed to enlarge the peritoneal incision.  The abdomen and pelvis were manually surveyed and an Engineer, site was then placed.  Metzenbaum scissors were used to incise filmy adhesions and the vesicouterine peritoneum was entered sharply and the bladder flap was developed digitally.   A window was noted in the lower uterine segment.  A scalpel was  used to make an incision and amniotic fluid was clear, the hysterotomy was enlarged by cephalic and caudal traction.  Infant's head was located elevated and delivered without difficulty followed by the remainder of the infant's body.  The delayed cord  clamping was performed for 60 seconds and the umbilical cord was then doubly clamped, cut, and infant was handed off to awaiting neonatology. Infant was dried and bulb suctioned while waiting for delayed cord clamping.  A sample of cord blood was  collected and external massage of the uterus was performed with gentle traction on the umbilical cord to facilitate delivery.  The uterus was cleared of all clot and debris and the uterine incision was closed with Vicryl in a running locked fashion  followed by a second layer of vertical imbrication.  If not noted prior the incision in the uterus was low transverse.  The uterine incision was found to be hemostatic.  Both ovaries and tubes were visualized and found to be normal.  The hysterotomy was  then reexamined and still hemostatic.  The [employer] was removed.  Peritoneum was reapproximated and closed with Monocryl in a running fashion followed by 3 running sutures reapproximating the lower aspect of the rectus muscles.  The fascia  was then reapproximated and closed with looped PDS in a running fashion.  Subcuticular tissue was reapproximated and closed with chromic and 5 interrupted sutures.  Skin was reapproximated and closed with Vicryl on a Keith needle subcuticularly.  The  patient tolerated the procedure well.  Sponge, lap and  needle counts were correct x2.  The patient was taken to recovery in stable condition.   PUS D: 10/08/2021 3:58:22 pm T: 10/08/2021 6:04:00 pm  JOB: [id number]/ [id number]

## 2021-10-08 NOTE — Transfer of Care (Signed)
Immediate Anesthesia Transfer of Care Note  Patient: Robin Miller  Procedure(s) Performed: REPEAT CESAREAN SECTION  Patient Location: PACU  Anesthesia Type:Spinal  Level of Consciousness: awake, alert  and oriented  Airway & Oxygen Therapy: Patient Spontanous Breathing  Post-op Assessment: Report given to RN and Post -op Vital signs reviewed and stable  Post vital signs: Reviewed and stable  Last Vitals:  Vitals Value Taken Time  BP 113/71 10/08/21 1345  Temp 36.5 C 10/08/21 1345  Pulse 84 10/08/21 1349  Resp 16 10/08/21 1349  SpO2 98 % 10/08/21 1349  Vitals shown include unvalidated device data.  Last Pain: There were no vitals filed for this visit.       Complications: No notable events documented.

## 2021-10-08 NOTE — Progress Notes (Signed)
MOB was referred for history of depression/anxiety. * Referral screened out by Clinical Social Worker because none of the following criteria appear to apply: ~ History of anxiety/depression during this pregnancy, or of post-partum depression following prior delivery. ~ Diagnosis of anxiety and/or depression within last 3 years. No concerns noted in OB records. OR * MOB's symptoms currently being treated with medication and/or therapy.  Please contact the Clinical Social Worker if needs arise, by MOB request, or if MOB scores greater than 9/yes to question 10 on Edinburgh Postpartum Depression Screen.   Robin Miller, MSW, LCSW Clinical Social Work [phone]  

## 2021-10-08 NOTE — H&P (Signed)
31 y.o. [redacted]w[redacted]d G3P2002 comes in c/o for scheduled repeat C/S.  Otherwise has good fetal movement and no bleeding.  Had considered VBAC, but only in setting of labor <39 weeks, desires to proceed with repeat c/s.  Past Medical History:  Diagnosis Date   Anemia    Cancer ([facility]    basal cell back and face   Crohn's disease with fistula & stenosis of ileocecal valve s/p ileocectomy 03/16/2012 03/17/2012   [place] [place] 11/04/2011   Depression    Erosive esophagitis    LA Class B   GERD (gastroesophageal reflux disease)    Nausea vomiting and diarrhea    Vasovagal syncope     Past Surgical History:  Procedure Laterality Date   CESAREAN SECTION N/A 03/15/2017   Procedure: CESAREAN SECTION;  Surgeon: AOlga Millers MD;  [place] [facility]  Service: Obstetrics;  Laterality: N/A;   CESAREAN SECTION N/A 07/12/2019   Procedure: CESAREAN SECTION;  Surgeon: CAllyn Kenner DO;  Location: [facility];  Service: Obstetrics;  Laterality: N/A;Nira Conn RNFA   CHOLECYSTECTOMY N/A 03/09/2013   Procedure: LAPAROSCOPIC CHOLECYSTECTOMY;  Surgeon: EGayland Curry MD;  [facility]: [facility];  Service: General;  Laterality: N/A;   COLONOSCOPY N/A 03/07/2013   Procedure: COLONOSCOPY;  Surgeon: CGatha Mayer MD;  Location: [facility];  Service: Endoscopy;  Laterality: N/A;   COLONOSCOPY     CYSTOSCOPY WITH RETROGRADE PYELOGRAM, URETEROSCOPY AND STENT PLACEMENT  05/27/2012   Procedure: CYSTOSCOPY WITH RETROGRADE PYELOGRAM, URETEROSCOPY AND STENT PLACEMENT;  Surgeon: LDutch Gray MD;  [facility]: [facility];  Service: Urology;  Laterality: Right;   ESOPHAGOGASTRODUODENOSCOPY N/A 03/04/2013   Procedure: ESOPHAGOGASTRODUODENOSCOPY (EGD);  Surgeon: JJerene Bears MD;  [facility];  Service: Endoscopy;  Laterality: N/A;   HOLMIUM LASER APPLICATION  19/62/2297  Procedure: HOLMIUM LASER APPLICATION;  Surgeon: LDutch Gray MD;  [facility]: [facility];  Service: Urology;  Laterality: Right;       ileocetomy  03/16/2012   LAPAROSCOPIC ILEOCECECTOMY  03/16/2012   Procedure: LAPAROSCOPIC ILEOCECECTOMY;  Surgeon: SAdin Hector MD;  [place] [facility]  Service: General;  Laterality: N/A;  lap assisted ileocectomy   SKIN CANCER EXCISION     TONSILLECTOMY AND ADENOIDECTOMY     UPPER GASTROINTESTINAL ENDOSCOPY      OB History  Gravida Para Term Preterm AB Living  '3 2 2     2  '$ SAB IAB Ectopic Multiple Live Births        0 2    # Outcome Date GA Lbr Len/2nd Weight Sex Delivery Anes PTL Lv  3 Current           2 Term 07/12/19 [id number] g M CS-LTranv Spinal  LIV  1 Term 03/16/17 [id number] g F CS-LTranv EPI  LIV    Social History   Socioeconomic History   Marital status: Married    Spouse name: Not on file   Number of children: 0   Years of education: Not on file   Highest education level: Not on file  Occupational History   Occupation: student  Tobacco Use   Smoking status: Never   Smokeless tobacco: Never  Vaping Use   Vaping Use: Never used  Substance and Sexual Activity   Alcohol use: No   Drug use: No   Sexual activity: Not Currently  Other Topics Concern   Not on file  Social History Narrative   Not on file   Social Determinants of  Health   Financial Resource Strain: Not on file  Food Insecurity: Not on file  Transportation Needs: Not on file  Physical Activity: Not on file  Stress: Not on file  Social Connections: Not on file  Intimate Partner Violence: Not on file   Prednisone    Prenatal Transfer Tool  Maternal Diabetes: No Genetic Screening: Normal Maternal Ultrasounds/Referrals: Normal Fetal Ultrasounds or other Referrals:  Referred to Materal Fetal Medicine  Maternal Substance Abuse:  No Significant Maternal Medications:  Meds include: Other: mesalamine daily Significant Maternal Lab Results: Group B Strep negative  Other PNC: Ulcerative colitis on mesalamine with h.o small bowel resection    Vitals:   10/08/21 1019  BP: (!) 134/98   Pulse: (!) 109  SpO2: 96%    Lungs/Cor:  NAD Abdomen:  soft, gravid Ex:  no cords, erythema SVE:  deferred    A/P   Admit for schedule repeat C/S x 3  GBS neg  2g Ancef for surgical prophylaxis  Other routine pre-op care  Allyn Kenner

## 2021-10-09 LAB — CBC
HCT: 28.7 % — ABNORMAL LOW (ref 36.0–46.0)
Hemoglobin: 9.2 g/dL — ABNORMAL LOW (ref 12.0–15.0)
MCH: 26.2 pg (ref 26.0–34.0)
MCHC: 32.1 g/dL (ref 30.0–36.0)
MCV: 81.8 fL (ref 80.0–100.0)
Platelets: 224 10*3/uL (ref 150–400)
RBC: 3.51 MIL/uL — ABNORMAL LOW (ref 3.87–5.11)
RDW: 15.5 % (ref 11.5–15.5)
WBC: 8.5 10*3/uL (ref 4.0–10.5)
nRBC: 0 % (ref 0.0–0.2)

## 2021-10-09 LAB — BIRTH TISSUE RECOVERY COLLECTION (PLACENTA DONATION)

## 2021-10-09 NOTE — Progress Notes (Signed)
Subjective: Postpartum Day 1: Cesarean Delivery Patient reports incisional pain, tolerating PO, and no problems voiding.  Baby feeding pretty well.  Objective: Vital signs in last 24 hours: Temp:  [97.5 F (36.4 C)-99.1 F (37.3 C)] 97.8 F (36.6 C) (06/14 0649) Pulse Rate:  [62-91] 85 (06/14 0649) Resp:  [14-29] 19 (06/14 0649) BP: (100-126)/(58-77) 105/64 (06/14 0649) SpO2:  [95 %-98 %] 97 % (06/13 1840)  Physical Exam:  General: alert and cooperative Lochia: appropriate Uterine Fundus: firm Incision: C/D/I--pressure dressing on DVT Evaluation: No evidence of DVT seen on physical exam.  Recent Labs    10/07/21 1023 10/09/21 0635  HGB 10.2* 9.2*  HCT 32.9* 28.7*    Assessment/Plan: Status post Cesarean section. Doing well postoperatively.  Remove pressure dressing today. D/w pt and husband circumcision at length.  They are torn about whether to proceed as feel it is not medically necessary but first son and dad have been circumcised.  We reviewed the procedure and risks/benefits in detail.  They ask for delay until tomorrow to decide.    Logan Bores 10/09/2021, 11:29 AM

## 2021-10-09 NOTE — Lactation Note (Addendum)
This note was copied from a baby's chart. Lactation Consultation Note  Patient Name: Robin Miller [id number] Date: 10/09/2021 Reason for consult: Follow-up assessment;Mother's request;Difficult latch;Term;Breastfeeding assistance Age:32 hours  Mom felt infant cluster feeding during the morning, not able to get him to settle to latch. Mom offered formula at 1340 20 ml.  LC not able to see a latch since recently fed formula. Mom to call for latch assistance with next feeding.   Plan 1. To feed based on cues 8-12x 24hr period. Mom to offer breasts and look for signs of milk transfer.  2. Mom to supplement with any Ebm from hand expression, followed by formula with pace bottle feeding and slow flow nipple. BF supplementation guide provided.   All questions answered at the end of the visit.   LC returned to assist with breastfeeding. Infant not able to latch, different positions tried. Infant arching back each time try a latch. LC gave 2 ml of colostrum on spoon. Infant left STS with Mom.   Maternal Data Has patient been taught Hand Expression?: Yes  Feeding Mother's Current Feeding Choice: Breast Milk and Formula Nipple Type: Slow - flow  LATCH Score                    Lactation Tools Discussed/Used    Interventions Interventions: Breast feeding basics reviewed;Breast compression;Expressed milk;Education;Pace feeding;Tour manager education  Discharge    Consult Status Consult Status: Follow-up Date: 10/10/21 Follow-up type: In-patient    Robin Miller  Robin Miller 10/09/2021, 2:07 PM

## 2021-10-10 NOTE — Progress Notes (Signed)
  Patient is eating, ambulating, voiding.  Pain control is good.  Vitals:   10/09/21 0649 10/09/21 1527 10/09/21 1926 10/10/21 0517  BP: 105/64 111/79 114/67 108/73  Pulse: 85 88 75 77  Resp: '19 18 18 16  '$ Temp: 97.8 F (36.6 C) 98 F (36.7 C) 97.8 F (36.6 C) 97.7 F (36.5 C)  TempSrc: Oral Oral Oral Oral  SpO2:      Weight:      Height:        lungs:   clear to auscultation cor:    RRR Abdomen:  soft, appropriate tenderness, incisions intact and without erythema or exudate ex:    no cords   Lab Results  Component Value Date   WBC 8.5 10/09/2021   HGB 9.2 (L) 10/09/2021   HCT 28.7 (L) 10/09/2021   MCV 81.8 10/09/2021   PLT 224 10/09/2021    --/--/O POS (06/12 1021)/RI  A/P    Post operative day 2.  Routine post op and postpartum care.  Expect d/c routine.  Iron.  Oxycodone for pain control.

## 2021-10-10 NOTE — Lactation Note (Signed)
This note was copied from a baby's chart. Lactation Consultation Note  Patient Name: Robin Miller [id number] Date: 10/10/2021 Reason for consult: Follow-up assessment Age:32 hours  LC in to room for follow up. Mother reports good feedings and deny any pain/discomfort. Infant has been sleepy since circumcision. Discussed normal behavior and patterns after 24h, voids and stools as signs good intake, pumping, clusterfeeding, skin to skin. Talked about milk coming into volume and managing engorgement.   Plan: 1-Feeding on demand or 8-12 times in 24h period. 2-Hand express/pump as needed for supplementation 3-Encouraged maternal rest, hydration and food intake.   Contact LC as needed for feeds/support/concerns/questions. All questions answered at this time.   Feeding Mother's Current Feeding Choice: Breast Milk and Formula  Interventions Interventions: Breast feeding basics reviewed;Skin to skin;Expressed milk;Education  Consult Status Consult Status: Follow-up Date: 10/11/21 Follow-up type: In-patient    Alicyn Klann A Higuera Ancidey 10/10/2021, 5:27 PM

## 2021-10-10 NOTE — Progress Notes (Signed)
Parents sure they desire circumsision.  All risks, benefits and alternatives discussed with the mother.

## 2021-10-11 LAB — BPAM RBC
Blood Product Expiration Date: 202307142359
Blood Product Expiration Date: 202307152359
Unit Type and Rh: 5100
Unit Type and Rh: 5100

## 2021-10-11 LAB — TYPE AND SCREEN
ABO/RH(D): O POS
Antibody Screen: NEGATIVE
Unit division: 0
Unit division: 0

## 2021-10-11 MED ORDER — OXYCODONE HCL 5 MG PO TABS: 5.0000 mg | ORAL_TABLET | ORAL | 0 refills | Status: AC | PRN

## 2021-10-11 NOTE — Lactation Note (Signed)
This note was copied from a baby's chart. Lactation Consultation Note  Patient Name: Robin Miller [id number] Date: 10/11/2021 Reason for consult:  (6 % weight loss,) Age:32 hours Dyad for D/C today.  LC reviewed the doc flow sheets and updated .  Mom latched the baby independently with depth and baby fed 16 mins with  Multiple swallows.  LC reviewed BF D/C teaching and mom aware of the [facility] resources after D/C.  Per mom breast feeding is going so much better compared to my 1st 2 babies with  Tongue-ties and lip tie with one and then tongue - tie with the other one.  Latch score 10   Maternal Data Has patient been taught Hand Expression?: Yes  Feeding Mother's Current Feeding Choice: Breast Milk Nipple Type: Slow - flow  LATCH Score Latch: Grasps breast easily, tongue down, lips flanged, rhythmical sucking.  Audible Swallowing: Spontaneous and intermittent  Type of Nipple: Everted at rest and after stimulation  Comfort (Breast/Nipple): Soft / non-tender  Hold (Positioning): No assistance needed to correctly position infant at breast.  LATCH Score: 10   Lactation Tools Discussed/Used    Interventions Interventions: Breast feeding basics reviewed;Support pillows  Discharge Discharge Education: Engorgement and breast care;Warning signs for feeding baby Pump: DEBP;Manual;Personal WIC Program: No  Consult Status Consult Status: Complete Date: 10/11/21    Myer Haff 10/11/2021, 11:39 AM

## 2021-10-11 NOTE — Discharge Summary (Signed)
Postpartum Discharge Summary    Patient Name: Robin Miller DOB: 09/29/1990 MRN: [id number]  Date of admission: 10/08/2021 Delivery date:10/08/2021  Delivering provider: Allyn Kenner  Date of discharge: 10/11/2021  Admitting diagnosis: History of cesarean section Intrauterine pregnancy: [redacted]w[redacted]d    Secondary diagnosis:  Active Problems:   Pregnant and not yet delivered  Additional problems: Ulcerative colitis    Discharge diagnosis: Term Pregnancy Delivered and Anemia      - acute blood loss                                       Post partum procedures: none Augmentation: N/A Complications: None  Hospital course: Sceduled C/S   31y.o. yo [id number] at 32w3das admitted to the hospital 10/08/2021 for scheduled cesarean section with the following indication:Elective Repeat.Delivery details are as follows:  Membrane Rupture Time/Date: 12:54 PM ,10/08/2021   Delivery Method:C-Section, Low Transverse  Details of operation can be found in separate operative note.  Patient had an uncomplicated postpartum course.  She is ambulating, tolerating a regular diet, passing flatus, and urinating well. Patient is discharged home in stable condition on  10/11/21        Newborn Data: Birth date:10/08/2021  Birth time:12:54 PM  Gender:Female  Living status:Living  Apgars:9 ,9  Weight:3890 g     Magnesium Sulfate received: No BMZ received: No Rhophylac:N/A MMR:N/A T-DaP: see office note Flu: N/A Transfusion:No  Physical exam  Vitals:   10/10/21 0517 10/10/21 1437 10/10/21 2112 10/11/21 0525  BP: 108/73 120/83 124/77 120/66  Pulse: 77 70 74 81  Resp: '16 16 18 16  ' Temp: 97.7 F (36.5 C) 97.6 F (36.4 C) 97.8 F (36.6 C) 98.3 F (36.8 C)  TempSrc: Oral Oral Oral Oral  SpO2:  98%    Weight:      Height:       General: alert, cooperative, and no distress Lochia: appropriate Uterine Fundus: firm Incision: Healing well with no significant drainage, No significant erythema, Dressing is  clean, dry, and intact DVT Evaluation: No evidence of DVT seen on physical exam. Labs: Lab Results  Component Value Date   WBC 8.5 10/09/2021   HGB 9.2 (L) 10/09/2021   HCT 28.7 (L) 10/09/2021   MCV 81.8 10/09/2021   PLT 224 10/09/2021      Latest Ref Rng & Units 02/17/2021    7:24 PM  CMP  Glucose 70 - 99 mg/dL 90   BUN 6 - 20 mg/dL 10   Creatinine 0.44 - 1.00 mg/dL 0.75   Sodium 135 - 145 mmol/L 136   Potassium 3.5 - 5.1 mmol/L 3.4   Chloride 98 - 111 mmol/L 104   CO2 22 - 32 mmol/L 22   Calcium 8.9 - 10.3 mg/dL 8.7   Total Protein 6.5 - 8.1 g/dL 7.1   Total Bilirubin 0.3 - 1.2 mg/dL 0.9   Alkaline Phos 38 - 126 U/L 60   AST 15 - 41 U/L 22   ALT 0 - 44 U/L 17    Edinburgh Score:    10/08/2021    4:15 PM  Edinburgh Postnatal Depression Scale Screening Tool  I have been able to laugh and see the funny side of things. 0  I have looked forward with enjoyment to things. 0  I have blamed myself unnecessarily when things went wrong. 2  I have been anxious or worried  for no good reason. 2  I have felt scared or panicky for no good reason. 1  Things have been getting on top of me. 1  I have been so unhappy that I have had difficulty sleeping. 0  I have felt sad or miserable. 0  I have been so unhappy that I have been crying. 1  The thought of harming myself has occurred to me. 0  Edinburgh Postnatal Depression Scale Total 7      After visit meds:  Allergies as of 10/11/2021       Reactions   Prednisone Nausea Only, Other (See Comments), Tinitus   Blurred vision,insomnia, shaky, body convulsions,   With higher doses        Medication List     STOP taking these medications    famotidine 20 MG tablet Commonly known as: PEPCID   omeprazole 20 MG capsule Commonly known as: PRILOSEC   ondansetron 8 MG disintegrating tablet Commonly known as: ZOFRAN-ODT   prenatal multivitamin Tabs tablet   promethazine 25 MG tablet Commonly known as: PHENERGAN        TAKE these medications    oxyCODONE 5 MG immediate release tablet Commonly known as: Oxy IR/ROXICODONE Take 1-2 tablets (5-10 mg total) by mouth every 4 (four) hours as needed for moderate pain.               Discharge Care Instructions  (From admission, onward)           Start     Ordered   10/11/21 0000  Discharge wound care:       Comments: Remove dressing and steri strips 5 days from date of surgery   10/11/21 1036             Discharge home in stable condition Infant Feeding:  see peds note Infant Disposition:home with mother Discharge instruction: per After Visit Summary and Postpartum booklet. Activity: Advance as tolerated. Pelvic rest for 6 weeks.  Diet: routine diet Anticipated Birth Control: Unsure Postpartum Appointment: 5-6 weeks Additional Postpartum F/U: Postpartum Depression checkup Future Appointments:No future appointments. Follow up Visit:  Follow-up Information     Allyn Kenner, DO. Call.   Specialty: Obstetrics and Gynecology Why: Call office to schedule postpartum visit for approximately 5 weeks, if having any concerns about incision, call office to be seen by another provider prior to your postpartum visit. Contact information: [address] [place] [address] [phone]                     10/11/2021 Allyn Kenner, DO

## 2021-10-16 ENCOUNTER — Telehealth (HOSPITAL_COMMUNITY): Payer: Self-pay | Admitting: *Deleted

## 2021-10-16 NOTE — Telephone Encounter (Signed)
Mom reports feeling good. Incision healing well per mom. No concerns regarding herself at this time. EPDS= 1 (hospital score=7) Mom reports baby is well. Feeding, peeing, and pooping without difficulty. Reviewed safe sleep. Mom has no concerns about baby at present.  Odis Hollingshead, RN 10-16-2021 at 2:19pm

## 2022-09-11 ENCOUNTER — Other Ambulatory Visit: Payer: Self-pay | Admitting: Obstetrics and Gynecology

## 2022-09-11 DIAGNOSIS — R928 Other abnormal and inconclusive findings on diagnostic imaging of breast: Secondary | ICD-10-CM

## 2022-09-15 ENCOUNTER — Ambulatory Visit
Admission: RE | Admit: 2022-09-15 | Discharge: 2022-09-15 | Disposition: A | Discharge status: Home / Self Care | Payer: Commercial Managed Care - PPO | Source: Ambulatory Visit | Attending: Obstetrics and Gynecology | Admitting: Obstetrics and Gynecology

## 2022-09-15 DIAGNOSIS — R928 Other abnormal and inconclusive findings on diagnostic imaging of breast: Secondary | ICD-10-CM

## 2022-09-23 ENCOUNTER — Other Ambulatory Visit: Payer: Self-pay | Admitting: Obstetrics and Gynecology

## 2022-09-23 ENCOUNTER — Other Ambulatory Visit: Payer: Commercial Managed Care - PPO

## 2022-09-23 DIAGNOSIS — N6489 Other specified disorders of breast: Secondary | ICD-10-CM

## 2023-03-19 ENCOUNTER — Ambulatory Visit
Admission: RE | Admit: 2023-03-19 | Discharge: 2023-03-19 | Disposition: A | Discharge status: Home / Self Care | Payer: Commercial Managed Care - PPO | Source: Ambulatory Visit | Attending: Obstetrics and Gynecology | Admitting: Obstetrics and Gynecology

## 2023-03-19 DIAGNOSIS — N6489 Other specified disorders of breast: Secondary | ICD-10-CM
